# Patient Record
Sex: Male | Born: 1970 | Race: White | Hispanic: No | Marital: Married | Smoking: Former smoker
Health system: Southern US, Community
[De-identification: ages and names within clinical notes are randomized; demographics above are authoritative.]

## PROBLEM LIST (undated history)

## (undated) DIAGNOSIS — K760 Fatty (change of) liver, not elsewhere classified: Secondary | ICD-10-CM

## (undated) DIAGNOSIS — I25110 Atherosclerotic heart disease of native coronary artery with unstable angina pectoris: Secondary | ICD-10-CM

## (undated) DIAGNOSIS — I10 Essential (primary) hypertension: Secondary | ICD-10-CM

## (undated) DIAGNOSIS — E785 Hyperlipidemia, unspecified: Secondary | ICD-10-CM

## (undated) HISTORY — PX: CARDIAC CATHETERIZATION: SHX172

---

## 1998-09-26 ENCOUNTER — Emergency Department (HOSPITAL_COMMUNITY): Admission: EM | Admit: 1998-09-26 | Discharge: 1998-09-26 | Payer: Self-pay | Admitting: Emergency Medicine

## 1998-12-25 ENCOUNTER — Emergency Department (HOSPITAL_COMMUNITY): Admission: EM | Admit: 1998-12-25 | Discharge: 1998-12-25 | Payer: Self-pay | Admitting: Emergency Medicine

## 2002-12-27 ENCOUNTER — Emergency Department (HOSPITAL_COMMUNITY): Admission: EM | Admit: 2002-12-27 | Discharge: 2002-12-27 | Payer: Self-pay | Admitting: Emergency Medicine

## 2002-12-27 ENCOUNTER — Encounter: Payer: Self-pay | Admitting: Emergency Medicine

## 2011-08-31 ENCOUNTER — Emergency Department (HOSPITAL_COMMUNITY): Payer: Self-pay

## 2011-08-31 ENCOUNTER — Encounter (HOSPITAL_COMMUNITY): Payer: Self-pay | Admitting: Emergency Medicine

## 2011-08-31 ENCOUNTER — Emergency Department (HOSPITAL_COMMUNITY)
Admission: EM | Admit: 2011-08-31 | Discharge: 2011-08-31 | Disposition: A | Discharge status: Home / Self Care | Payer: Self-pay | Attending: Emergency Medicine | Admitting: Emergency Medicine

## 2011-08-31 DIAGNOSIS — R42 Dizziness and giddiness: Secondary | ICD-10-CM | POA: Insufficient documentation

## 2011-08-31 DIAGNOSIS — R059 Cough, unspecified: Secondary | ICD-10-CM | POA: Insufficient documentation

## 2011-08-31 DIAGNOSIS — R0602 Shortness of breath: Secondary | ICD-10-CM | POA: Insufficient documentation

## 2011-08-31 DIAGNOSIS — R1110 Vomiting, unspecified: Secondary | ICD-10-CM | POA: Insufficient documentation

## 2011-08-31 DIAGNOSIS — R05 Cough: Secondary | ICD-10-CM

## 2011-08-31 NOTE — ED Provider Notes (Signed)
History     CSN: [id number]  Arrival date & time 08/31/11  1248   First MD Initiated Contact with Patient 08/31/11 1520      Chief Complaint  Patient presents with  . URI  . Cough  . Dizziness  . Shortness of Breath    (Consider location/radiation/quality/duration/timing/severity/associated sxs/prior treatment) HPI Comments: Patient presents for MRSA part with cough. He CTs had a cough for about a month and a half. He was recently treated with doxycycline from his primary care provider Dr. Milana Kidney. He finished this about one week ago he says that the cough is actually getting better however certain things like weather changes to your bad coughing spells. He today had a bad coughing spell when he went on to the cold air this caused him to have some posttussive emesis as well as feeling dizzy and lightheaded. He denies any chest pain or shortness of breath now. He states he feels back to his baseline now. Denies any ongoing dizziness denies any history of chest pain  Patient is a 41 y.o. male presenting with URI, cough, and shortness of breath. The history is provided by the patient.  URI The primary symptoms include cough. Primary symptoms do not include fever, fatigue, headaches, abdominal pain, nausea, vomiting, arthralgias or rash.  The illness is not associated with chills, congestion or rhinorrhea.  Cough Associated symptoms include shortness of breath. Pertinent negatives include no chest pain, no chills, no headaches and no rhinorrhea.  Shortness of Breath  Associated symptoms include cough and shortness of breath. Pertinent negatives include no chest pain, no fever and no rhinorrhea.    History reviewed. No pertinent past medical history.  History reviewed. No pertinent past surgical history.  History reviewed. No pertinent family history.  History  Substance Use Topics  . Smoking status: Former Games developer  . Smokeless tobacco: Not on file  . Alcohol Use: No      Review  of Systems  Constitutional: Negative for fever, chills, diaphoresis and fatigue.  HENT: Negative for congestion, rhinorrhea and sneezing.   Eyes: Negative.   Respiratory: Positive for cough and shortness of breath. Negative for chest tightness.   Cardiovascular: Negative for chest pain and leg swelling.  Gastrointestinal: Negative for nausea, vomiting, abdominal pain, diarrhea and blood in stool.  Genitourinary: Negative for frequency, hematuria, flank pain and difficulty urinating.  Musculoskeletal: Negative for back pain and arthralgias.  Skin: Negative for rash.  Neurological: Positive for dizziness. Negative for speech difficulty, weakness, numbness and headaches.    Allergies  Shrimp  Home Medications   Current Outpatient Rx  Name Route Sig Dispense Refill  . ALBUTEROL SULFATE HFA 108 (90 BASE) MCG/ACT IN AERS Inhalation Inhale 2 puffs into the lungs every 6 (six) hours as needed. For shortness of breath    . AMPHETAMINE-DEXTROAMPHETAMINE 20 MG PO TABS Oral Take 10 mg by mouth daily.    Marland Kitchen DIAZEPAM 10 MG PO TABS Oral Take 10 mg by mouth at bedtime as needed. For sleep    . HYDROCODONE-ACETAMINOPHEN 10-325 MG PO TABS Oral Take 1 tablet by mouth every 6 (six) hours as needed. For pain    . OLMESARTAN-AMLODIPINE-HCTZ 40-10-25 MG PO TABS Oral Take 0.5 tablets by mouth daily.      BP 117/77  Pulse 116  Temp(Src) 98.1 F (36.7 C) (Oral)  Resp 24  SpO2 98%  Physical Exam  Constitutional: He is oriented to person, place, and time. He appears well-developed and well-nourished.  HENT:  Head: Normocephalic  and atraumatic.  Eyes: Pupils are equal, round, and reactive to light.  Neck: Normal range of motion. Neck supple.  Cardiovascular: Normal rate, regular rhythm and normal heart sounds.   Pulmonary/Chest: Effort normal and breath sounds normal. No respiratory distress. He has no wheezes. He has no rales. He exhibits no tenderness.  Abdominal: Soft. Bowel sounds are normal. There  is no tenderness. There is no rebound and no guarding.  Musculoskeletal: Normal range of motion. He exhibits no edema.  Lymphadenopathy:    He has no cervical adenopathy.  Neurological: He is alert and oriented to person, place, and time.  Skin: Skin is warm and dry. No rash noted.  Psychiatric: He has a normal mood and affect.    ED Course  Procedures (including critical care time)  Labs Reviewed - No data to display Dg Chest 2 View  08/31/2011  *RADIOLOGY REPORT*  Clinical Data: Shortness of breath.  CHEST - 2 VIEW  Comparison: None  Findings: The cardiac silhouette, mediastinal and hilar contours are within normal limits.  The lungs are clear.  No pleural effusion.  The bony thorax is intact.  IMPRESSION: No acute cardiopulmonary findings.  Original Report Authenticated By: P. Loralie Champagne, M.D.    Date: 08/31/2011  Rate: 119  Rhythm: sinus tachycardia  QRS Axis: normal  Intervals: normal  ST/T Wave abnormalities: normal  Conduction Disutrbances:none  Narrative Interpretation:   Old EKG Reviewed: none available  No results found for this or any previous visit. Dg Chest 2 View  08/31/2011  *RADIOLOGY REPORT*  Clinical Data: Shortness of breath.  CHEST - 2 VIEW  Comparison: None  Findings: The cardiac silhouette, mediastinal and hilar contours are within normal limits.  The lungs are clear.  No pleural effusion.  The bony thorax is intact.  IMPRESSION: No acute cardiopulmonary findings.  Original Report Authenticated By: P. Loralie Champagne, M.D.      1. Cough       MDM  Pt with no CP.  Symptoms during bad coughing spell.  No SOB now.  No evidence of pneumonia.  Nothing to suggest ACS/PE.  No pneumothorax.  Likely resolving bronchitis.  Will d/c to f/u with his PMD        Rolan Bucco, MD 08/31/11 [phone]

## 2011-08-31 NOTE — ED Notes (Signed)
Pt c/o intermittant cough, dizziness and SOB x 1.5 months that became more severe today

## 2011-08-31 NOTE — Discharge Instructions (Signed)
Cool Mist Vaporizers °Vaporizers may help relieve the symptoms of a cough and cold. By adding water to the air, mucus may become thinner and less sticky. This makes it easier to breathe and cough up secretions. Vaporizers have not been proven to show they help with colds. You should not use a vaporizer if you are allergic to mold. Cool mist vaporizers do not cause serious burns like hot mist vaporizers ("steamers"). °HOME CARE INSTRUCTIONS °· Follow the package instructions for your vaporizer.  °· Use a vaporizer that holds a large volume of water (1½ to 2 gallons [5.7 to 7.5 liters]).  °· Do not use anything other than distilled water in the vaporizer.  °· Do not run the vaporizer all of the time. This can cause mold or bacteria to grow in the vaporizer.  °· Clean the vaporizer after each time you use it.  °· Clean and dry the vaporizer well before you store it.  °· Stop using a vaporizer if you develop worsening respiratory symptoms.  °Document Released: 03/26/2004 Document Revised: 03/11/2011 Document Reviewed: 02/21/2009 °ExitCare® Patient Information ©2012 [employer]. °

## 2011-08-31 NOTE — ED Notes (Signed)
Patient c/o dizziness.  Patient states he has "drunk" feeling especially after coughing.  Patient states he has completed course of antibiotics for recent could/flu symptoms.  Patient states now he has dizzy feeling after coughing or when up and moving around.

## 2020-11-12 ENCOUNTER — Observation Stay (HOSPITAL_COMMUNITY): Payer: Self-pay

## 2020-11-12 ENCOUNTER — Emergency Department (HOSPITAL_COMMUNITY): Payer: Self-pay

## 2020-11-12 ENCOUNTER — Inpatient Hospital Stay (HOSPITAL_COMMUNITY)
Admission: EM | Disposition: A | Discharge status: Home / Self Care | Payer: Self-pay | Source: Home / Self Care | Attending: Thoracic Surgery (Cardiothoracic Vascular Surgery)

## 2020-11-12 ENCOUNTER — Encounter (HOSPITAL_COMMUNITY): Payer: Self-pay | Admitting: Cardiology

## 2020-11-12 ENCOUNTER — Other Ambulatory Visit: Payer: Self-pay

## 2020-11-12 ENCOUNTER — Inpatient Hospital Stay (HOSPITAL_COMMUNITY)
Admission: EM | Admit: 2020-11-12 | Discharge: 2020-11-22 | DRG: 234 | Disposition: A | Discharge status: Home / Self Care | Payer: Self-pay | Attending: Thoracic Surgery (Cardiothoracic Vascular Surgery) | Admitting: Thoracic Surgery (Cardiothoracic Vascular Surgery)

## 2020-11-12 DIAGNOSIS — D62 Acute posthemorrhagic anemia: Secondary | ICD-10-CM | POA: Diagnosis not present

## 2020-11-12 DIAGNOSIS — R400 Somnolence: Secondary | ICD-10-CM | POA: Diagnosis not present

## 2020-11-12 DIAGNOSIS — Z20822 Contact with and (suspected) exposure to covid-19: Secondary | ICD-10-CM | POA: Diagnosis present

## 2020-11-12 DIAGNOSIS — I480 Paroxysmal atrial fibrillation: Secondary | ICD-10-CM | POA: Diagnosis not present

## 2020-11-12 DIAGNOSIS — K760 Fatty (change of) liver, not elsewhere classified: Secondary | ICD-10-CM | POA: Diagnosis present

## 2020-11-12 DIAGNOSIS — I2584 Coronary atherosclerosis due to calcified coronary lesion: Secondary | ICD-10-CM | POA: Diagnosis present

## 2020-11-12 DIAGNOSIS — I9581 Postprocedural hypotension: Secondary | ICD-10-CM | POA: Diagnosis not present

## 2020-11-12 DIAGNOSIS — Z79899 Other long term (current) drug therapy: Secondary | ICD-10-CM

## 2020-11-12 DIAGNOSIS — Z8249 Family history of ischemic heart disease and other diseases of the circulatory system: Secondary | ICD-10-CM

## 2020-11-12 DIAGNOSIS — I25110 Atherosclerotic heart disease of native coronary artery with unstable angina pectoris: Principal | ICD-10-CM | POA: Diagnosis present

## 2020-11-12 DIAGNOSIS — R000 Tachycardia, unspecified: Secondary | ICD-10-CM | POA: Diagnosis not present

## 2020-11-12 DIAGNOSIS — F419 Anxiety disorder, unspecified: Secondary | ICD-10-CM | POA: Diagnosis present

## 2020-11-12 DIAGNOSIS — E669 Obesity, unspecified: Secondary | ICD-10-CM | POA: Diagnosis present

## 2020-11-12 DIAGNOSIS — I97190 Other postprocedural cardiac functional disturbances following cardiac surgery: Secondary | ICD-10-CM | POA: Diagnosis not present

## 2020-11-12 DIAGNOSIS — Z6830 Body mass index (BMI) 30.0-30.9, adult: Secondary | ICD-10-CM

## 2020-11-12 DIAGNOSIS — I249 Acute ischemic heart disease, unspecified: Secondary | ICD-10-CM | POA: Diagnosis present

## 2020-11-12 DIAGNOSIS — E8770 Fluid overload, unspecified: Secondary | ICD-10-CM | POA: Diagnosis not present

## 2020-11-12 DIAGNOSIS — I200 Unstable angina: Secondary | ICD-10-CM | POA: Diagnosis present

## 2020-11-12 DIAGNOSIS — Z87891 Personal history of nicotine dependence: Secondary | ICD-10-CM

## 2020-11-12 DIAGNOSIS — Z91013 Allergy to seafood: Secondary | ICD-10-CM

## 2020-11-12 DIAGNOSIS — I10 Essential (primary) hypertension: Secondary | ICD-10-CM | POA: Diagnosis present

## 2020-11-12 DIAGNOSIS — J449 Chronic obstructive pulmonary disease, unspecified: Secondary | ICD-10-CM | POA: Diagnosis present

## 2020-11-12 DIAGNOSIS — R079 Chest pain, unspecified: Secondary | ICD-10-CM

## 2020-11-12 DIAGNOSIS — J9811 Atelectasis: Secondary | ICD-10-CM | POA: Diagnosis not present

## 2020-11-12 DIAGNOSIS — E119 Type 2 diabetes mellitus without complications: Secondary | ICD-10-CM

## 2020-11-12 DIAGNOSIS — I4892 Unspecified atrial flutter: Secondary | ICD-10-CM | POA: Diagnosis not present

## 2020-11-12 DIAGNOSIS — Z951 Presence of aortocoronary bypass graft: Secondary | ICD-10-CM

## 2020-11-12 DIAGNOSIS — E785 Hyperlipidemia, unspecified: Secondary | ICD-10-CM | POA: Diagnosis present

## 2020-11-12 DIAGNOSIS — Y838 Other surgical procedures as the cause of abnormal reaction of the patient, or of later complication, without mention of misadventure at the time of the procedure: Secondary | ICD-10-CM | POA: Diagnosis not present

## 2020-11-12 DIAGNOSIS — I255 Ischemic cardiomyopathy: Secondary | ICD-10-CM | POA: Diagnosis present

## 2020-11-12 HISTORY — PX: LEFT HEART CATH AND CORONARY ANGIOGRAPHY: CATH118249

## 2020-11-12 HISTORY — DX: Atherosclerotic heart disease of native coronary artery with unstable angina pectoris: I25.110

## 2020-11-12 HISTORY — DX: Fatty (change of) liver, not elsewhere classified: K76.0

## 2020-11-12 LAB — COMPREHENSIVE METABOLIC PANEL
ALT: 61 U/L — ABNORMAL HIGH (ref 0–44)
AST: 31 U/L (ref 15–41)
Albumin: 3.6 g/dL (ref 3.5–5.0)
Alkaline Phosphatase: 57 U/L (ref 38–126)
Anion gap: 7 (ref 5–15)
BUN: 15 mg/dL (ref 6–20)
CO2: 26 mmol/L (ref 22–32)
Calcium: 9.2 mg/dL (ref 8.9–10.3)
Chloride: 102 mmol/L (ref 98–111)
Creatinine, Ser: 1.16 mg/dL (ref 0.61–1.24)
GFR, Estimated: >60 mL/min (ref >60)
Glucose, Bld: 172 mg/dL — ABNORMAL HIGH (ref 70–99)
Potassium: 3.7 mmol/L (ref 3.5–5.1)
Sodium: 135 mmol/L (ref 135–145)
Total Bilirubin: 1.1 mg/dL (ref 0.3–1.2)
Total Protein: 6.7 g/dL (ref 6.5–8.1)

## 2020-11-12 LAB — CBC WITH DIFFERENTIAL/PLATELET
Abs Immature Granulocytes: 0.03 10*3/uL (ref 0.00–0.07)
Basophils Absolute: 0 10*3/uL (ref 0.0–0.1)
Basophils Relative: 0 %
Eosinophils Absolute: 0.1 10*3/uL (ref 0.0–0.5)
Eosinophils Relative: 1 %
HCT: 41.6 % (ref 39.0–52.0)
Hemoglobin: 14.2 g/dL (ref 13.0–17.0)
Immature Granulocytes: 0 %
Lymphocytes Relative: 22 %
Lymphs Abs: 2.2 10*3/uL (ref 0.7–4.0)
MCH: 30.2 pg (ref 26.0–34.0)
MCHC: 34.1 g/dL (ref 30.0–36.0)
MCV: 88.5 fL (ref 80.0–100.0)
Monocytes Absolute: 1.1 10*3/uL — ABNORMAL HIGH (ref 0.1–1.0)
Monocytes Relative: 11 %
Neutro Abs: 6.3 10*3/uL (ref 1.7–7.7)
Neutrophils Relative %: 66 %
Platelets: 201 10*3/uL (ref 150–400)
RBC: 4.7 MIL/uL (ref 4.22–5.81)
RDW: 12.8 % (ref 11.5–15.5)
WBC: 9.6 10*3/uL (ref 4.0–10.5)
nRBC: 0 % (ref 0.0–0.2)

## 2020-11-12 LAB — HIV ANTIBODY (ROUTINE TESTING W REFLEX): HIV Screen 4th Generation wRfx: NONREACTIVE

## 2020-11-12 LAB — LIPID PANEL
Cholesterol: 181 mg/dL (ref 0–200)
HDL: 34 mg/dL — ABNORMAL LOW (ref >40)
LDL Cholesterol: 127 mg/dL — ABNORMAL HIGH (ref 0–99)
Total CHOL/HDL Ratio: 5.3 RATIO
Triglycerides: 100 mg/dL (ref <150)
VLDL: 20 mg/dL (ref 0–40)

## 2020-11-12 LAB — SARS CORONAVIRUS 2 BY RT PCR (HOSPITAL ORDER, PERFORMED IN ~~LOC~~ HOSPITAL LAB): SARS Coronavirus 2: NEGATIVE

## 2020-11-12 LAB — ECHOCARDIOGRAM COMPLETE
Area-P 1/2: 4.06 cm2
Height: 72 in
S' Lateral: 3.7 cm
Weight: 3600 oz

## 2020-11-12 LAB — TROPONIN I (HIGH SENSITIVITY)
Troponin I (High Sensitivity): 20 ng/L — ABNORMAL HIGH (ref <18)
Troponin I (High Sensitivity): 19 ng/L — ABNORMAL HIGH (ref <18)

## 2020-11-12 SURGERY — LEFT HEART CATH AND CORONARY ANGIOGRAPHY
Anesthesia: LOCAL

## 2020-11-12 MED ORDER — MIDAZOLAM HCL 2 MG/2ML IJ SOLN
INTRAMUSCULAR | Status: DC | PRN
  Administered 2020-11-12: 2 mg via INTRAVENOUS

## 2020-11-12 MED ORDER — SODIUM CHLORIDE 0.9 % WEIGHT BASED INFUSION
1.0000 mL/kg/h | INTRAVENOUS | Status: DC
  Administered 2020-11-12: 1 mL/kg/h via INTRAVENOUS

## 2020-11-12 MED ORDER — ONDANSETRON HCL 4 MG/2ML IJ SOLN: 4.0000 mg | Freq: Four times a day (QID) | INTRAMUSCULAR | Status: DC | PRN

## 2020-11-12 MED ORDER — HEPARIN SODIUM (PORCINE) 1000 UNIT/ML IJ SOLN
INTRAMUSCULAR | Status: AC
  Filled 2020-11-12: qty 1

## 2020-11-12 MED ORDER — NITROGLYCERIN IN D5W 200-5 MCG/ML-% IV SOLN
0.0000 ug/min | INTRAVENOUS | Status: DC
  Administered 2020-11-12: 5 ug/min via INTRAVENOUS
  Administered 2020-11-12: 7.5 ug/min via INTRAVENOUS
  Administered 2020-11-12: 12.5 ug/min via INTRAVENOUS
  Administered 2020-11-12: 15 ug/min via INTRAVENOUS
  Administered 2020-11-13: 50 ug/min via INTRAVENOUS
  Filled 2020-11-12 (×3): qty 250

## 2020-11-12 MED ORDER — NITROGLYCERIN 0.4 MG SL SUBL
0.4000 mg | SUBLINGUAL_TABLET | Freq: Once | SUBLINGUAL | Status: AC
  Administered 2020-11-12: 0.4 mg via SUBLINGUAL
  Filled 2020-11-12: qty 1

## 2020-11-12 MED ORDER — SODIUM CHLORIDE 0.9 % IV SOLN: 250.0000 mL | INTRAVENOUS | Status: DC | PRN

## 2020-11-12 MED ORDER — MORPHINE SULFATE (PF) 2 MG/ML IV SOLN: 2.0000 mg | Freq: Four times a day (QID) | INTRAVENOUS | Status: DC | PRN

## 2020-11-12 MED ORDER — ROSUVASTATIN CALCIUM 20 MG PO TABS
40.0000 mg | ORAL_TABLET | Freq: Every day | ORAL | Status: DC
  Administered 2020-11-12 – 2020-11-13 (×2): 40 mg via ORAL
  Filled 2020-11-12 (×2): qty 2

## 2020-11-12 MED ORDER — HEPARIN (PORCINE) IN NACL 1000-0.9 UT/500ML-% IV SOLN
INTRAVENOUS | Status: AC
  Filled 2020-11-12: qty 1500

## 2020-11-12 MED ORDER — VERAPAMIL HCL 2.5 MG/ML IV SOLN
INTRAVENOUS | Status: AC
  Filled 2020-11-12: qty 2

## 2020-11-12 MED ORDER — SODIUM CHLORIDE 0.9% FLUSH: 3.0000 mL | INTRAVENOUS | Status: DC | PRN

## 2020-11-12 MED ORDER — ASPIRIN 81 MG PO CHEW
324.0000 mg | CHEWABLE_TABLET | ORAL | Status: AC
Start: 2020-11-12 — End: 2020-11-12
  Administered 2020-11-12: 324 mg via ORAL
  Filled 2020-11-12: qty 4

## 2020-11-12 MED ORDER — ACETAMINOPHEN 325 MG PO TABS: 650.0000 mg | ORAL_TABLET | ORAL | Status: DC | PRN

## 2020-11-12 MED ORDER — SODIUM CHLORIDE 0.9 % WEIGHT BASED INFUSION: 1.0000 mL/kg/h | INTRAVENOUS | Status: DC

## 2020-11-12 MED ORDER — SODIUM CHLORIDE 0.9 % WEIGHT BASED INFUSION: 3.0000 mL/kg/h | INTRAVENOUS | Status: DC

## 2020-11-12 MED ORDER — VERAPAMIL HCL 2.5 MG/ML IV SOLN
INTRAVENOUS | Status: DC | PRN
  Administered 2020-11-12: 5 mL via INTRA_ARTERIAL

## 2020-11-12 MED ORDER — HEPARIN (PORCINE) 25000 UT/250ML-% IV SOLN
1400.0000 [IU]/h | INTRAVENOUS | Status: DC
  Administered 2020-11-12: 1400 [IU]/h via INTRAVENOUS
  Filled 2020-11-12: qty 250

## 2020-11-12 MED ORDER — LIDOCAINE HCL (PF) 1 % IJ SOLN
INTRAMUSCULAR | Status: DC | PRN
  Administered 2020-11-12: 2 mL

## 2020-11-12 MED ORDER — IOHEXOL 350 MG/ML SOLN
INTRAVENOUS | Status: DC | PRN
  Administered 2020-11-12: 65 mL

## 2020-11-12 MED ORDER — MORPHINE SULFATE (PF) 2 MG/ML IV SOLN
2.0000 mg | Freq: Four times a day (QID) | INTRAVENOUS | Status: DC | PRN
  Administered 2020-11-12 (×2): 2 mg via INTRAVENOUS
  Filled 2020-11-12 (×2): qty 1

## 2020-11-12 MED ORDER — FENTANYL CITRATE (PF) 100 MCG/2ML IJ SOLN
50.0000 ug | Freq: Once | INTRAMUSCULAR | Status: AC
  Administered 2020-11-12: 50 ug via INTRAVENOUS
  Filled 2020-11-12: qty 2

## 2020-11-12 MED ORDER — HEPARIN (PORCINE) IN NACL 1000-0.9 UT/500ML-% IV SOLN
INTRAVENOUS | Status: DC | PRN
  Administered 2020-11-12 (×2): 500 mL

## 2020-11-12 MED ORDER — ASPIRIN 81 MG PO CHEW: 81.0000 mg | CHEWABLE_TABLET | ORAL | Status: DC

## 2020-11-12 MED ORDER — FENTANYL CITRATE (PF) 100 MCG/2ML IJ SOLN
INTRAMUSCULAR | Status: AC
  Filled 2020-11-12: qty 2

## 2020-11-12 MED ORDER — IOHEXOL 350 MG/ML SOLN
64.0000 mL | Freq: Once | INTRAVENOUS | Status: AC | PRN
  Administered 2020-11-12: 64 mL via INTRAVENOUS

## 2020-11-12 MED ORDER — PERFLUTREN LIPID MICROSPHERE
1.0000 mL | INTRAVENOUS | Status: AC | PRN
  Administered 2020-11-12: 2 mL via INTRAVENOUS
  Filled 2020-11-12: qty 10

## 2020-11-12 MED ORDER — HEPARIN BOLUS VIA INFUSION
4000.0000 [IU] | Freq: Once | INTRAVENOUS | Status: AC
  Administered 2020-11-12: 4000 [IU] via INTRAVENOUS
  Filled 2020-11-12: qty 4000

## 2020-11-12 MED ORDER — FUROSEMIDE 10 MG/ML IJ SOLN
20.0000 mg | Freq: Once | INTRAMUSCULAR | Status: AC
  Administered 2020-11-12: 20 mg via INTRAVENOUS
  Filled 2020-11-12: qty 2

## 2020-11-12 MED ORDER — METOPROLOL TARTRATE 25 MG PO TABS
25.0000 mg | ORAL_TABLET | Freq: Two times a day (BID) | ORAL | Status: DC
  Administered 2020-11-12: 25 mg via ORAL
  Filled 2020-11-12: qty 1

## 2020-11-12 MED ORDER — MIDAZOLAM HCL 2 MG/2ML IJ SOLN
INTRAMUSCULAR | Status: AC
  Filled 2020-11-12: qty 2

## 2020-11-12 MED ORDER — METOPROLOL TARTRATE 12.5 MG HALF TABLET
12.5000 mg | ORAL_TABLET | Freq: Two times a day (BID) | ORAL | Status: DC
  Administered 2020-11-12: 12.5 mg via ORAL
  Filled 2020-11-12: qty 1

## 2020-11-12 MED ORDER — ASPIRIN 300 MG RE SUPP: 300.0000 mg | RECTAL | Status: AC

## 2020-11-12 MED ORDER — ONDANSETRON HCL 4 MG/2ML IJ SOLN
4.0000 mg | Freq: Once | INTRAMUSCULAR | Status: AC
  Administered 2020-11-12: 4 mg via INTRAVENOUS
  Filled 2020-11-12: qty 2

## 2020-11-12 MED ORDER — FENTANYL CITRATE (PF) 100 MCG/2ML IJ SOLN
INTRAMUSCULAR | Status: DC | PRN
  Administered 2020-11-12: 25 ug via INTRAVENOUS

## 2020-11-12 MED ORDER — SODIUM CHLORIDE 0.9 % WEIGHT BASED INFUSION
1.0000 mL/kg/h | INTRAVENOUS | Status: DC
  Administered 2020-11-12 (×2): 1 mL/kg/h via INTRAVENOUS

## 2020-11-12 MED ORDER — ONDANSETRON HCL 4 MG/2ML IJ SOLN
4.0000 mg | Freq: Four times a day (QID) | INTRAMUSCULAR | Status: DC | PRN
  Administered 2020-11-13: 4 mg via INTRAVENOUS
  Filled 2020-11-12: qty 2

## 2020-11-12 MED ORDER — SODIUM CHLORIDE 0.9% FLUSH
3.0000 mL | Freq: Two times a day (BID) | INTRAVENOUS | Status: DC
  Administered 2020-11-12: 3 mL via INTRAVENOUS

## 2020-11-12 MED ORDER — HEPARIN (PORCINE) 25000 UT/250ML-% IV SOLN
1600.0000 [IU]/h | INTRAVENOUS | Status: AC
  Administered 2020-11-12: 1400 [IU]/h via INTRAVENOUS
  Administered 2020-11-13: 1600 [IU]/h via INTRAVENOUS
  Filled 2020-11-12: qty 250

## 2020-11-12 MED ORDER — SODIUM CHLORIDE 0.9 % IV BOLUS
1000.0000 mL | Freq: Once | INTRAVENOUS | Status: AC
  Administered 2020-11-12: 1000 mL via INTRAVENOUS

## 2020-11-12 MED ORDER — LIDOCAINE HCL (PF) 1 % IJ SOLN
INTRAMUSCULAR | Status: AC
  Filled 2020-11-12: qty 30

## 2020-11-12 MED ORDER — NITROGLYCERIN 0.4 MG SL SUBL: 0.4000 mg | SUBLINGUAL_TABLET | SUBLINGUAL | Status: DC | PRN

## 2020-11-12 MED ORDER — ASPIRIN EC 81 MG PO TBEC
81.0000 mg | DELAYED_RELEASE_TABLET | Freq: Every day | ORAL | Status: DC
  Administered 2020-11-13: 81 mg via ORAL
  Filled 2020-11-12: qty 1

## 2020-11-12 MED ORDER — HEPARIN SODIUM (PORCINE) 1000 UNIT/ML IJ SOLN
INTRAMUSCULAR | Status: DC | PRN
  Administered 2020-11-12: 5000 [IU] via INTRAVENOUS

## 2020-11-12 MED ORDER — SODIUM CHLORIDE 0.9% FLUSH
3.0000 mL | Freq: Two times a day (BID) | INTRAVENOUS | Status: DC
  Administered 2020-11-13: 3 mL via INTRAVENOUS

## 2020-11-12 SURGICAL SUPPLY — 10 items
CATH INFINITI JR4 5F (CATHETERS) ×2 IMPLANT
CATH OPTITORQUE TIG 4.0 5F (CATHETERS) ×2 IMPLANT
DEVICE RAD COMP TR BAND LRG (VASCULAR PRODUCTS) ×2 IMPLANT
GLIDESHEATH SLEND A-KIT 6F 22G (SHEATH) ×2 IMPLANT
GUIDEWIRE INQWIRE 1.5J.035X260 (WIRE) ×1 IMPLANT
INQWIRE 1.5J .035X260CM (WIRE) ×2
KIT HEART LEFT (KITS) ×2 IMPLANT
PACK CARDIAC CATHETERIZATION (CUSTOM PROCEDURE TRAY) ×2 IMPLANT
TRANSDUCER W/STOPCOCK (MISCELLANEOUS) ×2 IMPLANT
TUBING CIL FLEX 10 FLL-RA (TUBING) ×2 IMPLANT

## 2020-11-12 NOTE — Interval H&P Note (Signed)
History and Physical Interval Note:  11/12/2020 1:46 PM  Gerald Andrade  has presented today for surgery, with the diagnosis of unstable angina.  The various methods of treatment have been discussed with the patient and family. After consideration of risks, benefits and other options for treatment, the patient has consented to  Procedure(s): LEFT HEART CATH AND CORONARY ANGIOGRAPHY (N/A) and possible angioplasty as a surgical intervention.  The patient's history has been reviewed, patient examined, no change in status, stable for surgery.  I have reviewed the patient's chart and labs.  Questions were answered to the patient's satisfaction.    Cath Lab Visit (complete for each Cath Lab visit)  Clinical Evaluation Leading to the Procedure:   ACS: Yes.    Non-ACS:    Anginal Classification: CCS IV  Anti-ischemic medical therapy: Minimal Therapy (1 class of medications)  Non-Invasive Test Results: No non-invasive testing performed  Prior CABG: No previous CABG  Yates Decamp

## 2020-11-12 NOTE — Progress Notes (Signed)
  Echocardiogram 2D Echocardiogram has been performed.  Gerald Andrade 11/12/2020, 10:08 AM

## 2020-11-12 NOTE — ED Triage Notes (Signed)
BIB [facility] from home. Around 0130 started having across the chest pain. Its worse when pt inhales and ambulates. No radiation noted. 325 aspirin and 2 nitro PTA. Hx: COPD  133/76  88 heart rate 98%

## 2020-11-12 NOTE — ED Provider Notes (Addendum)
[facility] EMERGENCY DEPARTMENT Provider Note   CSN: [id number] Arrival date & time: 11/12/20  0341     History Chief Complaint  Patient presents with  . Chest Pain    Gerald Andrade is a 50 y.o. male.  HPI   50 year old male with history of hypertension, COPD, who presents to the emergency department today for evaluation of chest pain.  Patient was working in the yard yesterday and was in his normal state of health.  This morning around 1 AM he was woken with chest pain to the mid chest that radiates up the jaw.  Denies any radiation of pain to the back or arm.  He reports some pain with inspiration but denies any shortness of breath, cough, hemoptysis, bilateral lower extremity swelling, calf pain. Denies recent surgeries, hospital admissions, h/o vte/ca. tooks 325 asa, tums and his inhaler pta. Also had 2 ntg with ems.  Reports his father had CABG in his 43s. Mother had MI in mid 31s.   No past medical history on file.  Patient Active Problem List   Diagnosis Date Noted  . ACS (acute coronary syndrome) (HCC) 11/12/2020    No past surgical history on file.     No family history on file.  Social History   Tobacco Use  . Smoking status: Former Smoker  Substance Use Topics  . Alcohol use: No  . Drug use: No    Home Medications Prior to Admission medications   Medication Sig Start Date End Date Taking? Authorizing Provider  albuterol (PROVENTIL HFA;VENTOLIN HFA) 108 (90 BASE) MCG/ACT inhaler Inhale 2 puffs into the lungs every 6 (six) hours as needed for wheezing or shortness of breath.   Yes [provider]  amphetamine-dextroamphetamine (ADDERALL) 20 MG tablet Take 20 mg by mouth daily.   Yes [provider]  Cyanocobalamin (VITAMIN B 12 PO) Take 1 tablet by mouth daily.   Yes [provider]  diazepam (VALIUM) 10 MG tablet Take 10 mg by mouth at bedtime as needed for anxiety or sleep.   Yes [provider]   diphenhydrAMINE (BENADRYL) 25 MG tablet Take 25 mg by mouth every 6 (six) hours as needed for allergies.   Yes [provider]  montelukast (SINGULAIR) 10 MG tablet Take 1 tablet by mouth at bedtime. 10/03/20  Yes [provider]  Olmesartan-amLODIPine-HCTZ 40-10-25 MG TABS Take 0.5 tablets by mouth daily.   Yes [provider]  OVER THE COUNTER MEDICATION Take 2 tablets by mouth daily. T Male   Yes [provider]  Vitamin D, Cholecalciferol, 25 MCG (1000 UT) TABS Take 1,000 Units by mouth daily.   Yes [provider]  HYDROcodone-acetaminophen (NORCO) 10-325 MG per tablet Take 1 tablet by mouth every 6 (six) hours as needed for moderate pain.    [provider]    Allergies    Shrimp [shellfish allergy]  Review of Systems   Review of Systems  Constitutional: Negative for fever.  HENT: Negative for ear pain and sore throat.   Eyes: Negative for visual disturbance.  Respiratory: Negative for cough and shortness of breath.   Cardiovascular: Positive for chest pain. Negative for leg swelling.  Gastrointestinal: Negative for abdominal pain, constipation, diarrhea, nausea and vomiting.  Genitourinary: Negative for dysuria and hematuria.  Musculoskeletal: Negative for back pain.  Skin: Negative for rash.  Neurological: Negative for headaches.  All other systems reviewed and are negative.   Physical Exam Updated Vital Signs BP 107/71  Pulse 95   Temp (!) 97.5 F (36.4 C) (Oral)   Resp 18   Ht 6' (1.829 m)   Wt 102.1 kg   SpO2 98%   BMI 30.52 kg/m   Physical Exam Vitals and nursing note reviewed.  Constitutional:      Appearance: He is well-developed.  HENT:     Head: Normocephalic and atraumatic.  Eyes:     Conjunctiva/sclera: Conjunctivae normal.  Cardiovascular:     Rate and Rhythm: Normal rate and regular rhythm.     Heart sounds: Normal heart sounds. No murmur heard.   Pulmonary:     Effort: Pulmonary effort is  normal. No respiratory distress.     Breath sounds: Examination of the left-lower field reveals rales. Rales present.  Abdominal:     Palpations: Abdomen is soft.     Tenderness: There is no abdominal tenderness. There is no guarding or rebound.  Musculoskeletal:     Cervical back: Neck supple.  Skin:    General: Skin is warm and dry.  Neurological:     Mental Status: He is alert.     ED Results / Procedures / Treatments   Labs (all labs ordered are listed, but only abnormal results are displayed) Labs Reviewed  CBC WITH DIFFERENTIAL/PLATELET - Abnormal; Notable for the following components:      Result Value   Monocytes Absolute 1.1 (*)    All other components within normal limits  COMPREHENSIVE METABOLIC PANEL - Abnormal; Notable for the following components:   Glucose, Bld 172 (*)    ALT 61 (*)    All other components within normal limits  TROPONIN I (HIGH SENSITIVITY) - Abnormal; Notable for the following components:   Troponin I (High Sensitivity) 20 (*)    All other components within normal limits  TROPONIN I (HIGH SENSITIVITY) - Abnormal; Notable for the following components:   Troponin I (High Sensitivity) 19 (*)    All other components within normal limits  HEPARIN LEVEL (UNFRACTIONATED)  HIV ANTIBODY (ROUTINE TESTING W REFLEX)    EKG EKG Interpretation  Date/Time:  Tuesday Nov 12 2020 06:52:44 EDT Ventricular Rate:  97 PR Interval:  125 QRS Duration: 102 QT Interval:  347 QTC Calculation: 441 R Axis:   46 Text Interpretation: Sinus rhythm Abnormal R-wave progression, late transition Minimal ST elevation, lateral leads Confirmed by Nicanor Alcon, April ([id number]) on 11/12/2020 7:12:24 AM   Radiology DG Chest 1 View  Result Date: 11/12/2020 CLINICAL DATA:  Chest pain EXAM: CHEST  1 VIEW COMPARISON:  08/31/2011 FINDINGS: Generous heart size accentuated by low volumes. There is no edema, consolidation, effusion, or pneumothorax. Artifact from EKG leads. IMPRESSION: No  evidence of active disease. Electronically Signed   By: Marnee Spring M.D.   On: 11/12/2020 05:03   CT Angio Chest PE W and/or Wo Contrast  Result Date: 11/12/2020 CLINICAL DATA:  Chest pain that is worse with inhalation and ambulation. EXAM: CT ANGIOGRAPHY CHEST WITH CONTRAST TECHNIQUE: Multidetector CT imaging of the chest was performed using the standard protocol during bolus administration of intravenous contrast. Multiplanar CT image reconstructions and MIPs were obtained to evaluate the vascular anatomy. CONTRAST:  28mL OMNIPAQUE IOHEXOL 350 MG/ML SOLN COMPARISON:  None. FINDINGS: Cardiovascular: Satisfactory opacification of the pulmonary arteries to the segmental level. No evidence of pulmonary embolism. Normal heart size. No pericardial effusion. Coronary atheromatous calcification, especially notable for age. Mediastinum/Nodes: Negative for adenopathy or mass. Lungs/Pleura: There is no edema, consolidation, effusion, or pneumothorax. Dependent atelectasis. Triangular subpleural nodule  at the right minor fissure measuring 4 mm in average diameter, morphology consistent with lymph node. Upper Abdomen: Negative Musculoskeletal: Negative Review of the MIP images confirms the above findings. IMPRESSION: 1. Negative for pulmonary embolism or other acute finding. 2. Multifocal coronary calcification, notable for age. Electronically Signed   By: Marnee Spring M.D.   On: 11/12/2020 05:42    Procedures Procedures   CRITICAL CARE Performed by: Karrie Meres   Total critical care time: 43 minutes  Critical care time was exclusive of separately billable procedures and treating other patients.  Critical care was necessary to treat or prevent imminent or life-threatening deterioration.  Critical care was time spent personally by me on the following activities: development of treatment plan with patient and/or surrogate as well as nursing, discussions with consultants, evaluation of patient's  response to treatment, examination of patient, obtaining history from patient or surrogate, ordering and performing treatments and interventions, ordering and review of laboratory studies, ordering and review of radiographic studies, pulse oximetry and re-evaluation of patient's condition.   Medications Ordered in ED Medications  nitroGLYCERIN 50 mg in dextrose 5 % 250 mL (0.2 mg/mL) infusion (5 mcg/min Intravenous New Bag/Given 11/12/20 0720)  ondansetron (ZOFRAN) injection 4 mg (has no administration in time range)  heparin bolus via infusion 4,000 Units (has no administration in time range)  heparin ADULT infusion 100 units/mL (25000 units/225mL) (has no administration in time range)  aspirin chewable tablet 324 mg (has no administration in time range)    Or  aspirin suppository 300 mg (has no administration in time range)  aspirin EC tablet 81 mg (has no administration in time range)  nitroGLYCERIN (NITROSTAT) SL tablet 0.4 mg (has no administration in time range)  acetaminophen (TYLENOL) tablet 650 mg (has no administration in time range)  ondansetron (ZOFRAN) injection 4 mg (has no administration in time range)  fentaNYL (SUBLIMAZE) injection 50 mcg (50 mcg Intravenous Given 11/12/20 0440)  iohexol (OMNIPAQUE) 350 MG/ML injection 64 mL (64 mLs Intravenous Contrast Given 11/12/20 0523)  nitroGLYCERIN (NITROSTAT) SL tablet 0.4 mg (0.4 mg Sublingual Given 11/12/20 0630)  sodium chloride 0.9 % bolus 1,000 mL (1,000 mLs Intravenous New Bag/Given 11/12/20 5284)    ED Course  I have reviewed the triage vital signs and the nursing notes.  Pertinent labs & imaging results that were available during my care of the patient were reviewed by me and considered in my medical decision making (see chart for details).    MDM Rules/Calculators/A&P                          50 year old male presenting emergency department today for evaluation of chest pain that woke him up from sleep this  morning.  Reviewed/interpreted labs CBC is unremarkable CMP unremarkable trops are marginally elevated but flat  Multiple ekgs performed - Sinus rhythm Abnormal R-wave progression, late transition Minimal ST elevation, lateral leads - pt does not meet stemi criteria  CXR - No evidence of active disease CTA chest - 1. Negative for pulmonary embolism or other acute finding. 2. Multifocal coronary calcification, notable for age.  6:38 AM CONSULT with Dr. Allena Katz, he evaluated ekg. Recommended heparin and ntg drip.   7:18 AM Dr. Allena Katz evaluated the pt at bedside and admits pt  Final Clinical Impression(s) / ED Diagnoses Final diagnoses:  Unstable angina (HCC)    Rx / DC Orders ED Discharge Orders    None  Karrie MeresCouture, Aadvika Konen S, PA-C 11/12/20 [id number]    Karrie MeresCouture, Kolette Vey S, PA-C 11/12/20 [id number]    Palumbo, April, MD 11/18/20 2327

## 2020-11-12 NOTE — CV Procedure (Signed)
  Left Heart Catheterization 11/12/20:  LV: Mild LV systolic dysfunction, EF 45% with inferior akinesis.  No significant mitral regurgitation.  Normal EDP.  No pressure gradient across the aortic valve. LM: Large-caliber vessel, normal.  Short. LAD: Large vessel in the proximal segment.  Proximal LAD has an ulcerated lesion.  There is origin of a very large D1 at the proximal segment and a large septal perforator at which point the LAD is occluded and has faint ipsilateral collaterals from the circumflex, diagonal and the SP1, and after reconstitution of the LAD, there is a focal 80% stenosis.  The diagonal 1 is very large and has proximal high-grade 80 to 90% stenosis. CX: Large vessel, giving origin to small OM1 after which it is a CTO with bridging collaterals.  Distal circumflex also receives collaterals from the LAD.  Circumflex also gives collaterals to occluded RCA. RCA: It is occluded in the midsegment after the origin of RV branch.  Distal RCA including PL and PDA branches are large and are collateralized from circumflex and LAD.  Recommendation: Patient has complex multivessel disease and would benefit from CABG with arterial conduits if possible including LIMA to LAD and diagonal and RIMA to RCA and probably a free radial graft to the circumflex.  Surgical consultation has been requested.  Patient needs to be admitted inpatient in view of complex coronary anatomy and high risk for cardiac events.     Yates Decamp, MD, [facility] 11/12/2020, 2:53 PM Office: [phone] Pager: [phone]

## 2020-11-12 NOTE — Progress Notes (Signed)
[address]       [place] [address]             [phone]        Koben Daman [facility] Medical Record #[id number] Date of Birth: 11-25-1970  Referring: Dr. Jacinto Halim, MD Primary Care: Kaleen Mask, MD Primary Cardiologist:None  Chief Complaint:    Chief Complaint  Patient presents with  . Chest Pain  Reason for consultation: Multivessel CAD  History of Present Illness:     This is a 50 year old Caucasian male with a past medical history of hypertension, probable COPD, former tobacco abuse, fatty liver, and strong family history who expereienced chest pain (middle of chest and did not radiate) at 1:30 am this morning. According to patient, he had mowed 4 yards Monday and tried to rest after that.  After the initial chest pain began, he took 2 puffs of Albuterol as he thought the cause might be from pollen, which did not really help. He then took ec asa 325 mg, which again, did not alleviate the chest pain. Finally, he took two 5 mg Valium and asked his wife to call 911 as chest pain continued.  EKG showed subtle ST elevation in lead V2 with minimal ST depression in lead II and III. His chest pain did improve with Nitroglycerin. Troponin I (high sensitivity) highest has been 20. CT scan of the chest was negative for PE but did show multifocal coronary calcification. Echo showed LVEF 55-60% and no significant valvular disease. He then underwent a cardiac catheterization which showed LVEF 45%, proximal LAD to Mid LAD lesion is 100% stenosed, 1st Diag lesion is 90% stenosed, proximal Circumflex to mid Circumflx lesion is 100% stenosed, and proximal RCA to Mid RCA lesion is 100% stenosed. A cardiothoracic consultation has been requested with Dr. Cornelius Moras. At the time of my exam, patient was having some chest pain, on a Nitro drip. Heparin drip will be restarted in several hours. Current Activity/ Functional Status: Patient is independent with  mobility/ambulation, transfers, ADL's, IADL's.   Zubrod Score: At the time of surgery this patient's most appropriate activity status/level should be described as: []     0    Normal activity, no symptoms [x]     1    Restricted in physical strenuous activity but ambulatory, able to do out light work []     2    Ambulatory and capable of self care, unable to do work activities, up and about more than 50%  Of the time                            []     3    Only limited self care, in bed greater than 50% of waking hours []     4    Completely disabled, no self care, confined to bed or chair []     5    Moribund  Past Medical History:  Diagnosis Date  . Fatty liver    Surgical History: History reviewed. No pertinent surgical history.  Social History   Tobacco Use  Smoking Status Former Smoker  . Types: Cigarettes  Smokeless Tobacco Not on file    Social History   Substance and Sexual Activity  Alcohol Use No  Patient is married, has 9 children and 6 grand children. He is employed as a .   Allergies  Allergen Reactions  . Shrimp [Shellfish Allergy] Swelling  Chest tighten     Current Facility-Administered Medications  Medication Dose Route Frequency Provider Last Rate Last Admin  . 0.9 %  sodium chloride infusion  250 mL Intravenous PRN Patwardhan, Manish J, MD      . Melene Muller ON 11/13/2020] 0.9 %  sodium chloride infusion  250 mL Intravenous PRN Yates Decamp, MD      . 0.9% sodium chloride infusion  1 mL/kg/hr Intravenous Continuous Yates Decamp, MD 102.1 mL/hr at 11/12/20 1507 1 mL/kg/hr at 11/12/20 1507  . acetaminophen (TYLENOL) tablet 650 mg  650 mg Oral Q4H PRN Yates Decamp, MD      . Melene Muller ON 11/13/2020] aspirin EC tablet 81 mg  81 mg Oral Daily Yates Decamp, MD      . heparin ADULT infusion 100 units/mL (25000 units/28mL)  1,400 Units/hr Intravenous Continuous Patwardhan, Manish J, MD      . metoprolol tartrate (LOPRESSOR) tablet 25 mg  25 mg Oral BID Yates Decamp, MD      .  nitroGLYCERIN (NITROSTAT) SL tablet 0.4 mg  0.4 mg Sublingual Q5 Min x 3 PRN Yates Decamp, MD      . nitroGLYCERIN 50 mg in dextrose 5 % 250 mL (0.2 mg/mL) infusion  0-200 mcg/min Intravenous Continuous Yates Decamp, MD 6 mL/hr at 11/12/20 1613 20 mcg/min at 11/12/20 1613  . ondansetron (ZOFRAN) injection 4 mg  4 mg Intravenous Q6H PRN Yates Decamp, MD      . rosuvastatin (CRESTOR) tablet 40 mg  40 mg Oral Daily Yates Decamp, MD   40 mg at 11/12/20 1027  . sodium chloride flush (NS) 0.9 % injection 3 mL  3 mL Intravenous Q12H Patwardhan, Manish J, MD      . sodium chloride flush (NS) 0.9 % injection 3 mL  3 mL Intravenous PRN Patwardhan, Manish J, MD      . Melene Muller ON 11/13/2020] sodium chloride flush (NS) 0.9 % injection 3 mL  3 mL Intravenous Q12H Yates Decamp, MD      . Melene Muller ON 11/13/2020] sodium chloride flush (NS) 0.9 % injection 3 mL  3 mL Intravenous PRN Yates Decamp, MD        Medications Prior to Admission  Medication Sig Dispense Refill Last Dose  . albuterol (PROVENTIL HFA;VENTOLIN HFA) 108 (90 BASE) MCG/ACT inhaler Inhale 2 puffs into the lungs every 6 (six) hours as needed for wheezing or shortness of breath.   11/12/2020 at Unknown time  . amphetamine-dextroamphetamine (ADDERALL) 20 MG tablet Take 20 mg by mouth daily.   11/11/2020 at Unknown time  . Cyanocobalamin (VITAMIN B 12 PO) Take 1 tablet by mouth daily.   Past Week at Unknown time  . diazepam (VALIUM) 10 MG tablet Take 10 mg by mouth at bedtime as needed for anxiety or sleep.   11/12/2020 at Unknown time  . diphenhydrAMINE (BENADRYL) 25 MG tablet Take 25 mg by mouth every 6 (six) hours as needed for allergies.   11/11/2020 at Unknown time  . montelukast (SINGULAIR) 10 MG tablet Take 1 tablet by mouth at bedtime.   11/11/2020 at Unknown time  . Olmesartan-amLODIPine-HCTZ 40-10-25 MG TABS Take 0.5 tablets by mouth daily.   11/11/2020 at Unknown time  . OVER THE COUNTER MEDICATION Take 2 tablets by mouth daily. T Male   Past Week at Unknown time  .  Vitamin D, Cholecalciferol, 25 MCG (1000 UT) TABS Take 1,000 Units by mouth daily.   Past Week at Unknown time  . HYDROcodone-acetaminophen (NORCO) 10-325 MG per  tablet Take 1 tablet by mouth every 6 (six) hours as needed for moderate pain.   unk    Family History  Problem Relation Age of Onset  . CAD Mother-had MI, PCI   . CAD Father-had open heart surgery   Both mother and father are alive. Mother is 3 and father is 80 (in Hospice but lives with patient)  Review of Systems:     Cardiac Review of Systems: Y or  [ N   ]= no  Chest Pain [  Y  ]  Resting SOB [  N ] Exertional SOB  [ N ]  Orthopnea [ N ]   Pedal Edema [ N  ]    Syncope  Klaus.Mock  ]   Presyncope [  N ]  General Review of Systems: [Y] = yes [ N ]=no Constitional:  fatigue [ Y ]; nausea Klaus.Mock  ]; night sweats N[  ]; fever [ N ]; or chills [ N ]                                                              Patient has had COVID-mild symptoms and is NOT vaccinated  Eye :  diplopia [ N  ];   Amaurosis fugax[ N ]; Resp: cough [ N ];  wheezing[N  ];  hemoptysis[N  ];  GI: vomiting[ N ];  dysphagia[ N ]; melena[N  ];  hematochezia Klaus.Mock  ];  GU: hematuria[ N ];                 Skin: peripheral edema[ N ];   Musculosketetal:  joint swelling[ N ];  joint erythema[  N];   Heme/Lymph: anemia[N  ];  Neuro: Deyanira.Kussmaul  ];   stroke[N  ];  vertigo[N  ];  seizures[ N ];   difficulty walking[N  ];   Endocrine: diabetes[ N ];  thyroid dysfunction[N  ];             Physical Exam: BP 114/77   Pulse 86   Temp 99 F (37.2 C) (Oral)   Resp 20   Ht 6' (1.829 m)   Wt 102.1 kg   SpO2 97%   BMI 30.52 kg/m    General appearance: alert, cooperative and no distress Head: Normocephalic, without obvious abnormality, atraumatic Neck: no carotid bruit, no JVD and supple, symmetrical, trachea midline Resp: clear to auscultation bilaterally Cardio: RRR, no murmur GI: Soft, non tender, sporadic bowel sounds Extremities: No LE edema. Palpable DP/PT  bilaterally Neurologic: Grossly normal  Diagnostic Studies & Laboratory data:     Recent Radiology Findings:   DG Chest 1 View  Result Date: 11/12/2020 CLINICAL DATA:  Chest pain EXAM: CHEST  1 VIEW COMPARISON:  08/31/2011 FINDINGS: Generous heart size accentuated by low volumes. There is no edema, consolidation, effusion, or pneumothorax. Artifact from EKG leads. IMPRESSION: No evidence of active disease. Electronically Signed   By: Marnee Spring M.D.   On: 11/12/2020 05:03   CT Angio Chest PE W and/or Wo Contrast  Result Date: 11/12/2020 CLINICAL DATA:  Chest pain that is worse with inhalation and ambulation. EXAM: CT ANGIOGRAPHY CHEST WITH CONTRAST TECHNIQUE: Multidetector CT imaging of the chest was performed using the standard protocol during bolus administration of intravenous contrast. Multiplanar CT image  reconstructions and MIPs were obtained to evaluate the vascular anatomy. CONTRAST:  64mL OMNIPAQUE IOHEXOL 350 MG/ML SOLN COMPARISON:  None. FINDINGS: Cardiovascular: Satisfactory opacification of the pulmonary arteries to the segmental level. No evidence of pulmonary embolism. Normal heart size. No pericardial effusion. Coronary atheromatous calcification, especially notable for age. Mediastinum/Nodes: Negative for adenopathy or mass. Lungs/Pleura: There is no edema, consolidation, effusion, or pneumothorax. Dependent atelectasis. Triangular subpleural nodule at the right minor fissure measuring 4 mm in average diameter, morphology consistent with lymph node. Upper Abdomen: Negative Musculoskeletal: Negative Review of the MIP images confirms the above findings. IMPRESSION: 1. Negative for pulmonary embolism or other acute finding. 2. Multifocal coronary calcification, notable for age. Electronically Signed   By: Marnee SpringJonathon  Watts M.D.   On: 11/12/2020 05:42   CARDIAC CATHETERIZATION  Result Date: 11/12/2020 Left Heart Catheterization 11/12/20: LV: Mild LV systolic dysfunction, EF 45% with  inferior akinesis.  No significant mitral regurgitation.  Normal EDP.  No pressure gradient across the aortic valve. LM: Large-caliber vessel, normal.  Short. LAD: Large vessel in the proximal segment.  Proximal LAD has an ulcerated lesion.  There is origin of a very large D1 at the proximal segment and a large septal perforator at which point the LAD is occluded and has faint ipsilateral collaterals from the circumflex, diagonal and the SP1, and after reconstitution of the LAD, there is a focal 80% stenosis.  The diagonal 1 is very large and has proximal high-grade 80 to 90% stenosis. CX: Large vessel, giving origin to small OM1 after which it is a CTO with bridging collaterals.  Distal circumflex also receives collaterals from the LAD.  Circumflex also gives collaterals to occluded RCA. RCA: It is occluded in the midsegment after the origin of RV branch.  Distal RCA including PL and PDA branches are large and are collateralized from circumflex and LAD. Recommendation: Patient has complex multivessel disease and would benefit from CABG with arterial conduits if possible including LIMA to LAD and diagonal and RIMA to RCA and probably a free radial graft to the circumflex.  Surgical consultation has been requested.  Patient needs to be admitted inpatient in view of complex coronary anatomy and high risk for cardiac events.   Diagnostic Dominance: Right    Intervention   ECHOCARDIOGRAM COMPLETE  Result Date: 11/12/2020    ECHOCARDIOGRAM REPORT   Patient Name:   Gerald Andrade Date of Exam: 11/12/2020 Medical Rec #:  [id number]            Height:       72.0 in Accession #:    [id number]           Weight:       225.0 lb Date of Birth:  08/13/1970            BSA:          2.240 m Patient Age:    49 years             BP:           108/66 mmHg Patient Gender: M                    HR:           91 bpm. Exam Location:  Inpatient Procedure: 2D Echo and Intracardiac Opacification Agent Indications:     Chest  Pain R07.9  History:         Patient has no prior history of Echocardiogram examinations.  Sonographer:  Thurman Coyer RDCS (AE) Referring Phys:  [id number] [facility] PATWARDHAN Diagnosing Phys: Truett Mainland MD IMPRESSIONS  1. Left ventricular ejection fraction, by estimation, is 55 to 60%. The left ventricle has normal function. The left ventricle has no regional wall motion abnormalities. Left ventricular diastolic parameters were normal.  2. Right ventricular systolic function is normal. The right ventricular size is normal.  3. The mitral valve is grossly normal. No evidence of mitral valve regurgitation.  4. The aortic valve is tricuspid. Aortic valve regurgitation is not visualized. FINDINGS  Left Ventricle: Left ventricular ejection fraction, by estimation, is 55 to 60%. The left ventricle has normal function. The left ventricle has no regional wall motion abnormalities. Definity contrast agent was given IV to delineate the left ventricular  endocardial borders. The left ventricular internal cavity size was normal in size. There is no left ventricular hypertrophy. Left ventricular diastolic parameters were normal. Right Ventricle: The right ventricular size is normal. No increase in right ventricular wall thickness. Right ventricular systolic function is normal. Left Atrium: Left atrial size was normal in size. Right Atrium: Right atrial size was normal in size. Pericardium: There is no evidence of pericardial effusion. Mitral Valve: The mitral valve is grossly normal. No evidence of mitral valve regurgitation. Tricuspid Valve: The tricuspid valve is grossly normal. Tricuspid valve regurgitation is not demonstrated. Aortic Valve: The aortic valve is tricuspid. Aortic valve regurgitation is not visualized. Pulmonic Valve: The pulmonic valve was grossly normal. Pulmonic valve regurgitation is not visualized. Aorta: The aortic root and ascending aorta are structurally normal, with no evidence of  dilitation. Venous: The inferior vena cava was not well visualized. IAS/Shunts: No atrial level shunt detected by color flow Doppler.  LEFT VENTRICLE PLAX 2D LVIDd:         5.30 cm  Diastology LVIDs:         3.70 cm  LV e' medial:    7.40 cm/s LV PW:         0.90 cm  LV E/e' medial:  12.6 LV IVS:        0.80 cm  LV e' lateral:   9.14 cm/s LVOT diam:     2.30 cm  LV E/e' lateral: 10.2 LV SV:         81 LV SV Index:   36 LVOT Area:     4.15 cm  RIGHT VENTRICLE RV S prime:     20.20 cm/s TAPSE (M-mode): 2.4 cm LEFT ATRIUM             Index       RIGHT ATRIUM           Index LA diam:        4.50 cm 2.01 cm/m  RA Area:     12.20 cm LA Vol (A2C):   46.4 ml 20.71 ml/m RA Volume:   26.60 ml  11.88 ml/m LA Vol (A4C):   37.5 ml 16.74 ml/m LA Biplane Vol: 41.9 ml 18.71 ml/m  AORTIC VALVE LVOT Vmax:   95.80 cm/s LVOT Vmean:  69.600 cm/s LVOT VTI:    0.196 m  AORTA Ao Root diam: 3.30 cm MITRAL VALVE MV Area (PHT): 4.06 cm    SHUNTS MV Decel Time: 187 msec    Systemic VTI:  0.20 m MV E velocity: 93.50 cm/s  Systemic Diam: 2.30 cm MV A velocity: 88.60 cm/s MV E/A ratio:  1.06 Manish Patwardhan MD Electronically signed by Truett Mainland MD Signature Date/Time: 11/12/2020/10:47:43 AM  Final      I have independently reviewed the above radiologic studies and discussed with the patient   Recent Lab Findings: Lab Results  Component Value Date   WBC 9.6 11/12/2020   HGB 14.2 11/12/2020   HCT 41.6 11/12/2020   PLT 201 11/12/2020   GLUCOSE 172 (H) 11/12/2020   ALT 61 (H) 11/12/2020   AST 31 11/12/2020   NA 135 11/12/2020   K 3.7 11/12/2020   CL 102 11/12/2020   CREATININE 1.16 11/12/2020   BUN 15 11/12/2020   CO2 26 11/12/2020   Assessment / Plan:   1. Coronary artery disease-on Nitro drip.Heparin drip will be restarted this evening. Patient would likely benefit from coronary artery revascularization. Dr. Cornelius Moras to evaluate for coronary artery bypass grafting surgery. Patient tentatively on schedule for  Thursday 05/05 2. History of hypertension-on Olmesartan/Amlodipine/HCTZ prior to admission 3. History of probable COPD-previous tobacco abuse, on Albuterol PRN 4. History of fatty liver-ALT mildly elevated at 61    I  spent 20 minutes counseling the patient face to face.   Doree Fudge PA-C 11/12/2020 4:18 PM

## 2020-11-12 NOTE — Progress Notes (Signed)
ANTICOAGULATION CONSULT NOTE - Initial Consult  Pharmacy Consult for heparin Indication: chest pain/ACS  Allergies  Allergen Reactions  . Shrimp [Shellfish Allergy] Swelling    Chest tighten     Patient Measurements: Height: 6' (182.9 cm) Weight: 102.1 kg (225 lb) IBW/kg (Calculated) : 77.6 Heparin Dosing Weight: 100kg  Vital Signs: Temp: 97.5 F (36.4 C) (05/03 0346) Temp Source: Oral (05/03 0346) BP: 119/67 (05/03 0615) Pulse Rate: 95 (05/03 0615)  Labs: Recent Labs    11/12/20 0350  HGB 14.2  HCT 41.6  PLT 201  CREATININE 1.16  TROPONINIHS 20*    Estimated Creatinine Clearance: 94.2 mL/min (by C-G formula based on SCr of 1.16 mg/dL).   Assessment: 51yo male c/o CP radiating to jaw and that is worse with inspiration, CT negative for PE but initial troponin mildly elevated, to begin heparin.  Goal of Therapy:  Heparin level 0.3-0.7 units/ml Monitor platelets by anticoagulation protocol: Yes   Plan:  Will give heparin 4000 units IV bolus x1 followed by gtt at 1400 units/hr and monitor heparin levels and CBC.  Vernard Gambles, PharmD, BCPS  11/12/2020,6:46 AM

## 2020-11-12 NOTE — Progress Notes (Deleted)
ANTICOAGULATION CONSULT NOTE  Pharmacy Consult for heparin Indication: chest pain/ACS  Allergies  Allergen Reactions  . Shrimp [Shellfish Allergy] Swelling    Chest tighten     Patient Measurements: Height: 6' (182.9 cm) Weight: 102.1 kg (225 lb) IBW/kg (Calculated) : 77.6 Heparin Dosing Weight: 100kg  Vital Signs: Temp: 99 F (37.2 C) (05/03 1448) Temp Source: Oral (05/03 1448) BP: 122/80 (05/03 1448) Pulse Rate: 91 (05/03 1448)  Labs: Recent Labs    11/12/20 0350 11/12/20 0551  HGB 14.2  --   HCT 41.6  --   PLT 201  --   CREATININE 1.16  --   TROPONINIHS 20* 19*    Estimated Creatinine Clearance: 94.2 mL/min (by C-G formula based on SCr of 1.16 mg/dL).   Assessment: 50yo male c/o CP radiating to jaw and that is worse with inspiration, CT negative for PE but initial troponin mildly elevated, to begin heparin.  Cath 5/3 finding complex multivessel CAD - plan to restart heparin 8 hr post-sheath (sheath removed 5/3@1420 ). CBC stable. No s/sx of bleeding.  Goal of Therapy:  Heparin level 0.3-0.7 units/ml Monitor platelets by anticoagulation protocol: Yes   Plan:  Restart heparin infusion at 1400 units/hr on 5/3@2230  Order heparin level 6 hr after restart Monitor CBC, s/sx of bleeding and HL daily F/u CABG eval   Sherron Monday, PharmD, BCCCP Clinical Pharmacist  Phone: [phone] 11/12/2020 3:09 PM  Please check AMION for all [facility] Pharmacy phone numbers After 10:00 PM, call Main Pharmacy [phone]

## 2020-11-12 NOTE — H&P (Addendum)
Gerald Andrade is an 50 y.o. male.   Chief Complaint: Chest pain HPI:   49 y.o. Caucasian male  with family h/o CAD, former smoker, presented with waxing and waning retrosternal chest pain since 1:30 AM. Pain is partially responsive to NTG.  Patient initially underwent CT angiogram chest in the ER to rule out pulmonary embolism.  While he did not have any PE, he did have significant coronary atherosclerosis.  Patient was having 5 out of 10 chest pain on my evaluation.  Serial EKG showed subtle ST elevation in lead V2 with minimal ST depression in lead II and III, not meeting STEMI criteria without any dynamic changes.  Pain is improved with fentanyl as well as IV nitroglycerin.  High-sensitivity troponin mildly increased and flat at 20 and 19.  Past Medical History:  Diagnosis Date  . Fatty liver     History reviewed. No pertinent surgical history.   Family History  Problem Relation Age of Onset  . CAD Mother   . CAD Father     Social History:  reports that he has quit smoking. His smoking use included cigarettes. He does not have any smokeless tobacco history on file. He reports that he does not drink alcohol and does not use drugs.  Allergies:  Allergies  Allergen Reactions  . Shrimp [Shellfish Allergy] Swelling    Chest tighten     Review of Systems  Constitutional: Negative for decreased appetite, malaise/fatigue, weight gain and weight loss.  HENT: Negative for congestion.   Eyes: Negative for visual disturbance.  Cardiovascular: Positive for chest pain. Negative for dyspnea on exertion, leg swelling, palpitations and syncope.  Respiratory: Negative for cough.   Endocrine: Negative for cold intolerance.  Hematologic/Lymphatic: Does not bruise/bleed easily.  Skin: Negative for itching and rash.  Musculoskeletal: Negative for myalgias.  Gastrointestinal: Negative for abdominal pain, nausea and vomiting.  Genitourinary: Negative for dysuria.  Neurological: Negative  for dizziness and weakness.  Psychiatric/Behavioral: The patient is not nervous/anxious.   All other systems reviewed and are negative.    Blood pressure 105/67, pulse 93, temperature 97.8 F (36.6 C), temperature source Oral, resp. rate 15, height 6' (1.829 m), weight 102.1 kg, SpO2 100 %. Body mass index is 30.52 kg/m.  Physical Exam Vitals and nursing note reviewed.  Constitutional:      General: He is not in acute distress.    Appearance: He is well-developed.  HENT:     Head: Normocephalic and atraumatic.  Eyes:     Conjunctiva/sclera: Conjunctivae normal.     Pupils: Pupils are equal, round, and reactive to light.  Neck:     Vascular: No JVD.  Cardiovascular:     Rate and Rhythm: Normal rate and regular rhythm.     Pulses: Normal pulses and intact distal pulses.     Heart sounds: No murmur heard.   Pulmonary:     Effort: Pulmonary effort is normal.     Breath sounds: Normal breath sounds. No wheezing or rales.  Abdominal:     General: Bowel sounds are normal.     Palpations: Abdomen is soft.     Tenderness: There is no rebound.  Musculoskeletal:        General: No tenderness. Normal range of motion.     Right lower leg: No edema.     Left lower leg: No edema.  Lymphadenopathy:     Cervical: No cervical adenopathy.  Skin:    General: Skin is warm and dry.  Neurological:  Mental Status: He is alert and oriented to person, place, and time.     Cranial Nerves: No cranial nerve deficit.     Results for orders placed or performed during the hospital encounter of 11/12/20 (from the past 48 hour(s))  CBC with Differential     Status: Abnormal   Collection Time: 11/12/20  3:50 AM  Result Value Ref Range   WBC 9.6 4.0 - 10.5 K/uL   RBC 4.70 4.22 - 5.81 MIL/uL   Hemoglobin 14.2 13.0 - 17.0 g/dL   HCT 66.4 40.3 - 47.4 %   MCV 88.5 80.0 - 100.0 fL   MCH 30.2 26.0 - 34.0 pg   MCHC 34.1 30.0 - 36.0 g/dL   RDW 25.9 56.3 - 87.5 %   Platelets 201 150 - 400 K/uL    nRBC 0.0 0.0 - 0.2 %   Neutrophils Relative % 66 %   Neutro Abs 6.3 1.7 - 7.7 K/uL   Lymphocytes Relative 22 %   Lymphs Abs 2.2 0.7 - 4.0 K/uL   Monocytes Relative 11 %   Monocytes Absolute 1.1 (H) 0.1 - 1.0 K/uL   Eosinophils Relative 1 %   Eosinophils Absolute 0.1 0.0 - 0.5 K/uL   Basophils Relative 0 %   Basophils Absolute 0.0 0.0 - 0.1 K/uL   Immature Granulocytes 0 %   Abs Immature Granulocytes 0.03 0.00 - 0.07 K/uL    Comment: Performed at [facility], [address] [place], [place] [address]  Comprehensive metabolic panel     Status: Abnormal   Collection Time: 11/12/20  3:50 AM  Result Value Ref Range   Sodium 135 135 - 145 mmol/L   Potassium 3.7 3.5 - 5.1 mmol/L   Chloride 102 98 - 111 mmol/L   CO2 26 22 - 32 mmol/L   Glucose, Bld 172 (H) 70 - 99 mg/dL    Comment: Glucose reference range applies only to samples taken after fasting for at least 8 hours.   BUN 15 6 - 20 mg/dL   Creatinine, Ser 9.51 0.61 - 1.24 mg/dL   Calcium 9.2 8.9 - 88.4 mg/dL   Total Protein 6.7 6.5 - 8.1 g/dL   Albumin 3.6 3.5 - 5.0 g/dL   AST 31 15 - 41 U/L   ALT 61 (H) 0 - 44 U/L   Alkaline Phosphatase 57 38 - 126 U/L   Total Bilirubin 1.1 0.3 - 1.2 mg/dL   GFR, Estimated >16 >60 mL/min    Comment: (NOTE) Calculated using the CKD-EPI Creatinine Equation (2021)    Anion gap 7 5 - 15    Comment: Performed at [facility], [address], [place], [place] [address]  Troponin I (High Sensitivity)     Status: Abnormal   Collection Time: 11/12/20  3:50 AM  Result Value Ref Range   Troponin I (High Sensitivity) 20 (H) <18 ng/L    Comment: (NOTE) Elevated high sensitivity troponin I (hsTnI) values and significant  changes across serial measurements may suggest ACS but many other  chronic and acute conditions are known to elevate hsTnI results.  Refer to the "Links" section for chest pain algorithms and additional  guidance. Performed at [facility], [address]  [place], [place] [address]   Troponin I (High Sensitivity)     Status: Abnormal   Collection Time: 11/12/20  5:51 AM  Result Value Ref Range   Troponin I (High Sensitivity) 19 (H) <18 ng/L    Comment: (  NOTE) Elevated high sensitivity troponin I (hsTnI) values and significant  changes across serial measurements may suggest ACS but many other  chronic and acute conditions are known to elevate hsTnI results.  Refer to the "Links" section for chest pain algorithms and additional  guidance. Performed at [facility], [address], [place], [place] [address]     Labs:   Lab Results  Component Value Date   WBC 9.6 11/12/2020   HGB 14.2 11/12/2020   HCT 41.6 11/12/2020   MCV 88.5 11/12/2020   PLT 201 11/12/2020    Recent Labs  Lab 11/12/20 0350  NA 135  K 3.7  CL 102  CO2 26  BUN 15  CREATININE 1.16  CALCIUM 9.2  PROT 6.7  BILITOT 1.1  ALKPHOS 57  ALT 61*  AST 31  GLUCOSE 172*    Lipid Panel  No results found for: CHOL, TRIG, HDL, CHOLHDL, VLDL, LDLCALC  BNP (last 3 results) No results for input(s): BNP in the last 8760 hours.  HEMOGLOBIN A1C No results found for: HGBA1C, MPG  Cardiac Panel (last 3 results) No results for input(s): CKTOTAL, CKMB, RELINDX in the last 8760 hours.  Invalid input(s): TROPONINHS  No results found for: CKTOTAL, CKMB, CKMBINDEX   TSH No results for input(s): TSH in the last 8760 hours.   (Not in a hospital admission)     Current Facility-Administered Medications:  .  acetaminophen (TYLENOL) tablet 650 mg, 650 mg, Oral, Q4H PRN, Aviela Blundell J, MD .  Melene Muller ON 11/13/2020] aspirin EC tablet 81 mg, 81 mg, Oral, Daily, Carston Riedl J, MD .  heparin ADULT infusion 100 units/mL (25000 units/214mL), 1,400 Units/hr, Intravenous, Continuous, Juliette Mangle, RPH, Last Rate: 14 mL/hr at 11/12/20 0731, 1,400 Units/hr at 11/12/20 0731 .  nitroGLYCERIN (NITROSTAT) SL tablet 0.4 mg, 0.4 mg, Sublingual, Q5 Min x 3 PRN,  Dlisa Barnwell J, MD .  nitroGLYCERIN 50 mg in dextrose 5 % 250 mL (0.2 mg/mL) infusion, 0-200 mcg/min, Intravenous, Continuous, Couture, Cortni S, PA-C, Last Rate: 3 mL/hr at 11/12/20 0826, 10 mcg/min at 11/12/20 0826 .  ondansetron (ZOFRAN) injection 4 mg, 4 mg, Intravenous, Q6H PRN, Jazzlin Clements J, MD  Current Outpatient Medications:  .  albuterol (PROVENTIL HFA;VENTOLIN HFA) 108 (90 BASE) MCG/ACT inhaler, Inhale 2 puffs into the lungs every 6 (six) hours as needed for wheezing or shortness of breath., Disp: , Rfl:  .  amphetamine-dextroamphetamine (ADDERALL) 20 MG tablet, Take 20 mg by mouth daily., Disp: , Rfl:  .  Cyanocobalamin (VITAMIN B 12 PO), Take 1 tablet by mouth daily., Disp: , Rfl:  .  diazepam (VALIUM) 10 MG tablet, Take 10 mg by mouth at bedtime as needed for anxiety or sleep., Disp: , Rfl:  .  diphenhydrAMINE (BENADRYL) 25 MG tablet, Take 25 mg by mouth every 6 (six) hours as needed for allergies., Disp: , Rfl:  .  montelukast (SINGULAIR) 10 MG tablet, Take 1 tablet by mouth at bedtime., Disp: , Rfl:  .  Olmesartan-amLODIPine-HCTZ 40-10-25 MG TABS, Take 0.5 tablets by mouth daily., Disp: , Rfl:  .  OVER THE COUNTER MEDICATION, Take 2 tablets by mouth daily. T Male, Disp: , Rfl:  .  Vitamin D, Cholecalciferol, 25 MCG (1000 UT) TABS, Take 1,000 Units by mouth daily., Disp: , Rfl:  .  HYDROcodone-acetaminophen (NORCO) 10-325 MG per tablet, Take 1 tablet by mouth every 6 (six) hours as needed for moderate pain., Disp: , Rfl:    Today's Vitals  11/12/20 0800 11/12/20 0815 11/12/20 0830 11/12/20 0840  BP: 116/71 109/68 105/67   Pulse: 95 98 93   Resp: 20 20 15    Temp: 97.8 F (36.6 C)     TempSrc: Oral     SpO2: 97% 98% 100%   Weight:      Height:      PainSc:    3    Body mass index is 30.52 kg/m.   CARDIAC STUDIES:  EKG 11/12/2020: Sinus rhythm.  Subtle ST elevation in lead V2, subtle ST depression in lead II, III not meeting STEMI  criteria  Echocardiogram pending:   Assessment/Plan  50 y.o. Caucasian male  with family h/o CAD, former smoker, with chest pain concerning for unstable angina  Unstable angina: Subtle ST elevation in lead V2, subtle ST depression in lead II, 3, not meeting STEMI criteria, without dynamic changes.  Pain improved with IV nitroglycerin. Given absence of STEMI and improvement in chest pain, would like to delay coronary angiogram for a few hours given recent contrast load given for CT angiogram chest. Continue aspirin, heparin, IV nitroglycerin, will add Crestor 40 mg, metoprolol tartrate 12.5 mg bid. Lipid panel pending.   Elder NegusManish J Harmoni Lucus, MD Pager: [phone] Office: [phone]

## 2020-11-12 NOTE — Progress Notes (Signed)
ANTICOAGULATION CONSULT NOTE - Follow Up Consult  Pharmacy Consult for IV Heparin Indication: chest pain/ACS  Allergies  Allergen Reactions  . Shrimp [Shellfish Allergy] Swelling    Chest tighten     Patient Measurements: Height: 6' (182.9 cm) Weight: 102.1 kg (225 lb) IBW/kg (Calculated) : 77.6 Heparin Dosing Weight: 98.5 kg  Vital Signs: Temp: 99 F (37.2 C) (05/03 1448) Temp Source: Oral (05/03 1448) BP: 122/80 (05/03 1448) Pulse Rate: 91 (05/03 1448)  Labs: Recent Labs    11/12/20 0350 11/12/20 0551  HGB 14.2  --   HCT 41.6  --   PLT 201  --   CREATININE 1.16  --   TROPONINIHS 20* 19*    Estimated Creatinine Clearance: 94.2 mL/min (by C-G formula based on SCr of 1.16 mg/dL).   Assessment: 50 yr old male presented with CP radiating to jaw that was worse with inspiration. CT was negative for PE; initial troponin was mildly elevated. Pharmacy was consulted for heparin dosing. Pt was on no anticoagulant PTA.  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 Pharmacy is consulted to resume heparin 8 hrs after sheath pull (per cath lab procedure log, sheath was pulled at 1420 PM this afternoon).  Pt rec'd heparin 4000 units IV bolus, followed by heparin infusion at 1400 units/hr prior to cath. Per RN, no bleeding issues observed post cath.  H/H 14.2/41.6, plt 201  Goal of Therapy:  Heparin level 0.3-0.7 units/ml Monitor platelets by anticoagulation protocol: Yes   Plan:  Resume heparin infusion at 1400 units/hr at 22:30 PM this evening (~8 hrs after sheath removal) Check 6-hr heparin level Monitor daily heparin level, CBC Monitor for bleeding F/U CVTS plans for CABG  Vicki Mallet, PharmD, BCPS, [facility] Clinical Pharmacist 11/12/2020,2:58 PM

## 2020-11-13 ENCOUNTER — Observation Stay (HOSPITAL_COMMUNITY): Payer: Self-pay

## 2020-11-13 ENCOUNTER — Encounter (HOSPITAL_COMMUNITY): Payer: Self-pay | Admitting: Cardiology

## 2020-11-13 DIAGNOSIS — I25110 Atherosclerotic heart disease of native coronary artery with unstable angina pectoris: Secondary | ICD-10-CM

## 2020-11-13 DIAGNOSIS — E119 Type 2 diabetes mellitus without complications: Secondary | ICD-10-CM

## 2020-11-13 DIAGNOSIS — Z1810 Retained metal fragments, unspecified: Secondary | ICD-10-CM

## 2020-11-13 HISTORY — DX: Type 2 diabetes mellitus without complications: E11.9

## 2020-11-13 LAB — COMPREHENSIVE METABOLIC PANEL
ALT: 53 U/L — ABNORMAL HIGH (ref 0–44)
AST: 24 U/L (ref 15–41)
Albumin: 3.8 g/dL (ref 3.5–5.0)
Alkaline Phosphatase: 59 U/L (ref 38–126)
Anion gap: 7 (ref 5–15)
BUN: 12 mg/dL (ref 6–20)
CO2: 23 mmol/L (ref 22–32)
Calcium: 8.6 mg/dL — ABNORMAL LOW (ref 8.9–10.3)
Chloride: 104 mmol/L (ref 98–111)
Creatinine, Ser: 1 mg/dL (ref 0.61–1.24)
GFR, Estimated: >60 mL/min (ref >60)
Glucose, Bld: 157 mg/dL — ABNORMAL HIGH (ref 70–99)
Potassium: 3.8 mmol/L (ref 3.5–5.1)
Sodium: 134 mmol/L — ABNORMAL LOW (ref 135–145)
Total Bilirubin: 2.2 mg/dL — ABNORMAL HIGH (ref 0.3–1.2)
Total Protein: 6.9 g/dL (ref 6.5–8.1)

## 2020-11-13 LAB — HEMOGLOBIN A1C
Hgb A1c MFr Bld: 7.9 % — ABNORMAL HIGH (ref 4.8–5.6)
Mean Plasma Glucose: 180.03 mg/dL

## 2020-11-13 LAB — CBC
HCT: 41 % (ref 39.0–52.0)
Hemoglobin: 13.9 g/dL (ref 13.0–17.0)
MCH: 30.2 pg (ref 26.0–34.0)
MCHC: 33.9 g/dL (ref 30.0–36.0)
MCV: 88.9 fL (ref 80.0–100.0)
Platelets: 205 10*3/uL (ref 150–400)
RBC: 4.61 MIL/uL (ref 4.22–5.81)
RDW: 12.8 % (ref 11.5–15.5)
WBC: 10.7 10*3/uL — ABNORMAL HIGH (ref 4.0–10.5)
nRBC: 0 % (ref 0.0–0.2)

## 2020-11-13 LAB — TSH: TSH: 0.651 u[IU]/mL (ref 0.350–4.500)

## 2020-11-13 LAB — TYPE AND SCREEN
ABO/RH(D): A POS
Antibody Screen: NEGATIVE

## 2020-11-13 LAB — APTT: aPTT: 43 seconds — ABNORMAL HIGH (ref 24–36)

## 2020-11-13 LAB — PREALBUMIN: Prealbumin: 16.8 mg/dL — ABNORMAL LOW (ref 18–38)

## 2020-11-13 LAB — PROTIME-INR
INR: 1.1 (ref 0.8–1.2)
Prothrombin Time: 13.9 seconds (ref 11.4–15.2)

## 2020-11-13 LAB — HEPARIN LEVEL (UNFRACTIONATED): Heparin Unfractionated: 0.22 IU/mL — ABNORMAL LOW (ref 0.30–0.70)

## 2020-11-13 LAB — SURGICAL PCR SCREEN
MRSA, PCR: NEGATIVE
Staphylococcus aureus: POSITIVE — AB

## 2020-11-13 LAB — ABO/RH: ABO/RH(D): A POS

## 2020-11-13 MED ORDER — INSULIN REGULAR(HUMAN) IN NACL 100-0.9 UT/100ML-% IV SOLN
INTRAVENOUS | Status: AC
  Administered 2020-11-14: 2 [IU]/h via INTRAVENOUS
  Filled 2020-11-13: qty 100

## 2020-11-13 MED ORDER — MUPIROCIN 2 % EX OINT: 1.0000 "application " | TOPICAL_OINTMENT | Freq: Two times a day (BID) | CUTANEOUS | Status: DC

## 2020-11-13 MED ORDER — POTASSIUM CHLORIDE 2 MEQ/ML IV SOLN
80.0000 meq | INTRAVENOUS | Status: DC
  Filled 2020-11-13: qty 40

## 2020-11-13 MED ORDER — EPINEPHRINE HCL 5 MG/250ML IV SOLN IN NS
0.0000 ug/min | INTRAVENOUS | Status: DC
Start: 2020-11-14 — End: 2020-11-14
  Filled 2020-11-13: qty 250

## 2020-11-13 MED ORDER — ALBUTEROL SULFATE HFA 108 (90 BASE) MCG/ACT IN AERS
2.0000 | INHALATION_SPRAY | Freq: Four times a day (QID) | RESPIRATORY_TRACT | Status: DC | PRN
  Filled 2020-11-13: qty 6.7

## 2020-11-13 MED ORDER — CHLORHEXIDINE GLUCONATE CLOTH 2 % EX PADS
6.0000 | MEDICATED_PAD | Freq: Every day | CUTANEOUS | Status: AC
  Administered 2020-11-13 – 2020-11-17 (×5): 6 via TOPICAL

## 2020-11-13 MED ORDER — PHENYLEPHRINE HCL-NACL 20-0.9 MG/250ML-% IV SOLN
30.0000 ug/min | INTRAVENOUS | Status: AC
  Administered 2020-11-14: 20 ug/min via INTRAVENOUS
  Filled 2020-11-13: qty 250

## 2020-11-13 MED ORDER — BISACODYL 5 MG PO TBEC
5.0000 mg | DELAYED_RELEASE_TABLET | Freq: Once | ORAL | Status: AC
  Administered 2020-11-14: 5 mg via ORAL
  Filled 2020-11-13 (×2): qty 1

## 2020-11-13 MED ORDER — MAGNESIUM SULFATE 50 % IJ SOLN
40.0000 meq | INTRAMUSCULAR | Status: DC
  Filled 2020-11-13: qty 9.85

## 2020-11-13 MED ORDER — METOPROLOL SUCCINATE ER 25 MG PO TB24
25.0000 mg | ORAL_TABLET | Freq: Every day | ORAL | Status: DC
  Administered 2020-11-13: 25 mg via ORAL
  Filled 2020-11-13: qty 1

## 2020-11-13 MED ORDER — VANCOMYCIN HCL 1500 MG/300ML IV SOLN
1500.0000 mg | INTRAVENOUS | Status: AC
  Administered 2020-11-14: 1500 mg via INTRAVENOUS
  Filled 2020-11-13: qty 300

## 2020-11-13 MED ORDER — CEFAZOLIN SODIUM-DEXTROSE 2-4 GM/100ML-% IV SOLN
2.0000 g | INTRAVENOUS | Status: DC
  Filled 2020-11-13: qty 100

## 2020-11-13 MED ORDER — MUPIROCIN 2 % EX OINT
1.0000 "application " | TOPICAL_OINTMENT | Freq: Two times a day (BID) | CUTANEOUS | Status: AC
  Administered 2020-11-14 – 2020-11-18 (×9): 1 via NASAL
  Filled 2020-11-13 (×3): qty 22

## 2020-11-13 MED ORDER — MILRINONE LACTATE IN DEXTROSE 20-5 MG/100ML-% IV SOLN
0.3000 ug/kg/min | INTRAVENOUS | Status: DC
  Filled 2020-11-13: qty 100

## 2020-11-13 MED ORDER — TRANEXAMIC ACID 1000 MG/10ML IV SOLN
1.5000 mg/kg/h | INTRAVENOUS | Status: AC
  Administered 2020-11-14: 1.5 mg/kg/h via INTRAVENOUS
  Filled 2020-11-13: qty 25

## 2020-11-13 MED ORDER — SODIUM CHLORIDE 0.9 % IV SOLN
INTRAVENOUS | Status: DC
  Filled 2020-11-13: qty 30

## 2020-11-13 MED ORDER — DIAZEPAM 5 MG PO TABS
5.0000 mg | ORAL_TABLET | Freq: Two times a day (BID) | ORAL | Status: DC | PRN
  Administered 2020-11-13 – 2020-11-14 (×2): 5 mg via ORAL
  Filled 2020-11-13 (×2): qty 1

## 2020-11-13 MED ORDER — TEMAZEPAM 15 MG PO CAPS: 15.0000 mg | ORAL_CAPSULE | Freq: Once | ORAL | Status: DC | PRN

## 2020-11-13 MED ORDER — NITROGLYCERIN IN D5W 200-5 MCG/ML-% IV SOLN
2.0000 ug/min | INTRAVENOUS | Status: DC
  Filled 2020-11-13: qty 250

## 2020-11-13 MED ORDER — CHLORHEXIDINE GLUCONATE 4 % EX LIQD
60.0000 mL | Freq: Once | CUTANEOUS | Status: AC
  Administered 2020-11-14: 4 via TOPICAL
  Filled 2020-11-13: qty 15

## 2020-11-13 MED ORDER — TRANEXAMIC ACID (OHS) PUMP PRIME SOLUTION
2.0000 mg/kg | INTRAVENOUS | Status: DC
  Filled 2020-11-13: qty 1.97

## 2020-11-13 MED ORDER — DEXMEDETOMIDINE HCL IN NACL 400 MCG/100ML IV SOLN
0.1000 ug/kg/h | INTRAVENOUS | Status: AC
  Administered 2020-11-14: .3 ug/kg/h via INTRAVENOUS
  Filled 2020-11-13: qty 100

## 2020-11-13 MED ORDER — TRANEXAMIC ACID (OHS) BOLUS VIA INFUSION
15.0000 mg/kg | INTRAVENOUS | Status: AC
  Administered 2020-11-14: 1477.5 mg via INTRAVENOUS
  Filled 2020-11-13: qty 1478

## 2020-11-13 MED ORDER — CHLORHEXIDINE GLUCONATE 0.12 % MT SOLN
15.0000 mL | Freq: Once | OROMUCOSAL | Status: AC
  Administered 2020-11-14: 15 mL via OROMUCOSAL
  Filled 2020-11-13: qty 15

## 2020-11-13 MED ORDER — CEFAZOLIN SODIUM-DEXTROSE 2-4 GM/100ML-% IV SOLN
2.0000 g | INTRAVENOUS | Status: AC
  Administered 2020-11-14 (×2): 2 g via INTRAVENOUS
  Filled 2020-11-13: qty 100

## 2020-11-13 MED ORDER — METOPROLOL TARTRATE 12.5 MG HALF TABLET
12.5000 mg | ORAL_TABLET | Freq: Once | ORAL | Status: AC
  Administered 2020-11-14: 12.5 mg via ORAL
  Filled 2020-11-13: qty 1

## 2020-11-13 MED ORDER — CHLORHEXIDINE GLUCONATE 4 % EX LIQD
60.0000 mL | Freq: Once | CUTANEOUS | Status: AC
  Administered 2020-11-13: 4 via TOPICAL
  Filled 2020-11-13: qty 15

## 2020-11-13 MED ORDER — PLASMA-LYTE 148 IV SOLN
INTRAVENOUS | Status: DC
  Filled 2020-11-13: qty 2.5

## 2020-11-13 MED ORDER — VANCOMYCIN HCL 1000 MG IV SOLR
INTRAVENOUS | Status: DC
  Filled 2020-11-13: qty 1000

## 2020-11-13 MED ORDER — NOREPINEPHRINE 4 MG/250ML-% IV SOLN
0.0000 ug/min | INTRAVENOUS | Status: DC
  Filled 2020-11-13: qty 250

## 2020-11-13 NOTE — Consult Note (Addendum)
[address]       [place] [id number]             [phone]                                       [name] Medical Record #[id number] Date of Birth: 10/18/1970  Referring: Dr. Jacinto HalimGanji, MD Primary Care: Kaleen MaskElkins, Wilson Oliver, MD Primary Cardiologist:None  Chief Complaint:       Chief Complaint  Patient presents with  . Chest Pain  Reason for consultation: Multivessel CAD  History of Present Illness:     This is a 50 year old Caucasian male with a past medical history of hypertension, probable COPD, former tobacco abuse, fatty liver, and strong family history who expereienced chest pain (middle of chest and did not radiate) at 1:30 am this morning. According to patient, he had mowed 4 yards Monday and tried to rest after that.  After the initial chest pain began, he took 2 puffs of Albuterol as he thought the cause might be from pollen, which did not really help. He then took ec asa 325 mg, which again, did not alleviate the chest pain. Finally, he took two 5 mg Valium and asked his wife to call 911 as chest pain continued.  EKG showed subtle ST elevation in lead V2 with minimal ST depression in lead II and III. His chest pain did improve with Nitroglycerin. Troponin I (high sensitivity) highest has been 20. CT scan of the chest was negative for PE but did show multifocal coronary calcification. Echo showed LVEF 55-60% and no significant valvular disease. He then underwent a cardiac catheterization which showed LVEF 45%, proximal LAD to Mid LAD lesion is 100% stenosed, 1st Diag lesion is 90% stenosed, proximal Circumflex to mid Circumflx lesion is 100% stenosed, and proximal RCA to Mid RCA lesion is 100% stenosed. A cardiothoracic consultation has been requested with Dr. Cornelius Moraswen. At the time of my exam, patient was having some chest pain, on a Nitro drip. Heparin drip will be restarted in several hours. Current Activity/ Functional Status: Patient  is independent with mobility/ambulation, transfers, ADL's, IADL's.   Zubrod Score: At the time of surgery this patient's most appropriate activity status/level should be described as: [] ?    0    Normal activity, no symptoms [x] ?    1    Restricted in physical strenuous activity but ambulatory, able to do out light work [] ?    2    Ambulatory and capable of self care, unable to do work activities, up and about more than 50%  Of the time                            [] ?    3    Only limited self care, in bed greater than 50% of waking hours [] ?    4    Completely disabled, no self care, confined to bed or chair [] ?    5    Moribund      Past Medical History:  Diagnosis Date  . Fatty liver    Surgical History: History reviewed. No pertinent surgical history.  Social History       Tobacco Use  Smoking Status Former Smoker  . Types: Cigarettes  Smokeless Tobacco Not on file  Social History      Substance and Sexual Activity  Alcohol Use No  Patient is married, has 9 children and 6 grand children. He is employed as a Musician.        Allergies  Allergen Reactions  . Shrimp [Shellfish Allergy] Swelling    Chest tighten              Current Facility-Administered Medications  Medication Dose Route Frequency Provider Last Rate Last Admin  . 0.9 %  sodium chloride infusion  250 mL Intravenous PRN Patwardhan, Manish J, MD      . Melene Muller ON 11/13/2020] 0.9 %  sodium chloride infusion  250 mL Intravenous PRN Yates Decamp, MD      . 0.9% sodium chloride infusion  1 mL/kg/hr Intravenous Continuous Yates Decamp, MD 102.1 mL/hr at 11/12/20 1507 1 mL/kg/hr at 11/12/20 1507  . acetaminophen (TYLENOL) tablet 650 mg  650 mg Oral Q4H PRN Yates Decamp, MD      . Melene Muller ON 11/13/2020] aspirin EC tablet 81 mg  81 mg Oral Daily Yates Decamp, MD      . heparin ADULT infusion 100 units/mL (25000 units/261mL)  1,400 Units/hr Intravenous Continuous Patwardhan, Manish J, MD      . metoprolol  tartrate (LOPRESSOR) tablet 25 mg  25 mg Oral BID Yates Decamp, MD      . nitroGLYCERIN (NITROSTAT) SL tablet 0.4 mg  0.4 mg Sublingual Q5 Min x 3 PRN Yates Decamp, MD      . nitroGLYCERIN 50 mg in dextrose 5 % 250 mL (0.2 mg/mL) infusion  0-200 mcg/min Intravenous Continuous Yates Decamp, MD 6 mL/hr at 11/12/20 1613 20 mcg/min at 11/12/20 1613  . ondansetron (ZOFRAN) injection 4 mg  4 mg Intravenous Q6H PRN Yates Decamp, MD      . rosuvastatin (CRESTOR) tablet 40 mg  40 mg Oral Daily Yates Decamp, MD   40 mg at 11/12/20 1027  . sodium chloride flush (NS) 0.9 % injection 3 mL  3 mL Intravenous Q12H Patwardhan, Manish J, MD      . sodium chloride flush (NS) 0.9 % injection 3 mL  3 mL Intravenous PRN Patwardhan, Manish J, MD      . Melene Muller ON 11/13/2020] sodium chloride flush (NS) 0.9 % injection 3 mL  3 mL Intravenous Q12H Yates Decamp, MD      . Melene Muller ON 11/13/2020] sodium chloride flush (NS) 0.9 % injection 3 mL  3 mL Intravenous PRN Yates Decamp, MD               Medications Prior to Admission  Medication Sig Dispense Refill Last Dose  . albuterol (PROVENTIL HFA;VENTOLIN HFA) 108 (90 BASE) MCG/ACT inhaler Inhale 2 puffs into the lungs every 6 (six) hours as needed for wheezing or shortness of breath.   11/12/2020 at Unknown time  . amphetamine-dextroamphetamine (ADDERALL) 20 MG tablet Take 20 mg by mouth daily.   11/11/2020 at Unknown time  . Cyanocobalamin (VITAMIN B 12 PO) Take 1 tablet by mouth daily.   Past Week at Unknown time  . diazepam (VALIUM) 10 MG tablet Take 10 mg by mouth at bedtime as needed for anxiety or sleep.   11/12/2020 at Unknown time  . diphenhydrAMINE (BENADRYL) 25 MG tablet Take 25 mg by mouth every 6 (six) hours as needed for allergies.   11/11/2020 at Unknown time  . montelukast (SINGULAIR) 10 MG tablet Take 1 tablet by mouth at bedtime.   11/11/2020 at Unknown time  .  Olmesartan-amLODIPine-HCTZ 40-10-25 MG TABS Take 0.5 tablets by mouth daily.   11/11/2020 at Unknown time  . OVER  THE COUNTER MEDICATION Take 2 tablets by mouth daily. T Male   Past Week at Unknown time  . Vitamin D, Cholecalciferol, 25 MCG (1000 UT) TABS Take 1,000 Units by mouth daily.   Past Week at Unknown time  . HYDROcodone-acetaminophen (NORCO) 10-325 MG per tablet Take 1 tablet by mouth every 6 (six) hours as needed for moderate pain.   unk         Family History  Problem Relation Age of Onset  . CAD Mother-had MI, PCI   . CAD Father-had open heart surgery   Both mother and father are alive. Mother is 35 and father is 73 (in Hospice but lives with patient)  Review of Systems:               Cardiac Review of Systems: Y or  [ N   ]= no             Chest Pain [  Y  ]        Resting SOB [  N ]      Exertional SOB  [ N ]  Orthopnea [ N ]             Pedal Edema [ N  ]     Syncope  Klaus.Mock  ]             Presyncope [  N ]             General Review of Systems: [Y] = yes [ N ]=no Constitional:  fatigue [ Y ]; nausea Klaus.Mock  ]; night sweats N[  ]; fever [ N ]; or chills [ N ]                                                              Patient has had COVID-mild symptoms and is NOT vaccinated             Eye :  diplopia [ N  ];   Amaurosis fugax[ N ]; Resp: cough [ N ];  wheezing[N  ];  hemoptysis[N  ];  GI: vomiting[ N ];  dysphagia[ N ]; melena[N  ];  hematochezia Klaus.Mock  ];  GU: hematuria[ N ];                 Skin: peripheral edema[ N ];   Musculosketetal:  joint swelling[ N ];  joint erythema[  N];              Heme/Lymph: anemia[N  ];  Neuro: Deyanira.Kussmaul  ];   stroke[N  ];  vertigo[N  ];  seizures[ N ];   difficulty walking[N  ];              Endocrine: diabetes[ N ];  thyroid dysfunction[N  ];                        Physical Exam: BP 114/77   Pulse 86   Temp 99 F (37.2 C) (Oral)   Resp 20   Ht 6' (1.829 m)   Wt 102.1 kg   SpO2 97%   BMI 30.52 kg/m  General appearance: alert, cooperative and no distress Head: Normocephalic, without obvious abnormality, atraumatic Neck: no  carotid bruit, no JVD and supple, symmetrical, trachea midline Resp: clear to auscultation bilaterally Cardio: RRR, no murmur GI: Soft, non tender, sporadic bowel sounds Extremities: No LE edema. Palpable DP/PT bilaterally Neurologic: Grossly normal  Diagnostic Studies & Laboratory data:     Recent Radiology Findings:   DG Chest 1 View  Result Date: 11/12/2020 CLINICAL DATA:  Chest pain EXAM: CHEST  1 VIEW COMPARISON:  08/31/2011 FINDINGS: Generous heart size accentuated by low volumes. There is no edema, consolidation, effusion, or pneumothorax. Artifact from EKG leads. IMPRESSION: No evidence of active disease. Electronically Signed   By: Marnee Spring M.D.   On: 11/12/2020 05:03   CT Angio Chest PE W and/or Wo Contrast  Result Date: 11/12/2020 CLINICAL DATA:  Chest pain that is worse with inhalation and ambulation. EXAM: CT ANGIOGRAPHY CHEST WITH CONTRAST TECHNIQUE: Multidetector CT imaging of the chest was performed using the standard protocol during bolus administration of intravenous contrast. Multiplanar CT image reconstructions and MIPs were obtained to evaluate the vascular anatomy. CONTRAST:  64mL OMNIPAQUE IOHEXOL 350 MG/ML SOLN COMPARISON:  None. FINDINGS: Cardiovascular: Satisfactory opacification of the pulmonary arteries to the segmental level. No evidence of pulmonary embolism. Normal heart size. No pericardial effusion. Coronary atheromatous calcification, especially notable for age. Mediastinum/Nodes: Negative for adenopathy or mass. Lungs/Pleura: There is no edema, consolidation, effusion, or pneumothorax. Dependent atelectasis. Triangular subpleural nodule at the right minor fissure measuring 4 mm in average diameter, morphology consistent with lymph node. Upper Abdomen: Negative Musculoskeletal: Negative Review of the MIP images confirms the above findings. IMPRESSION: 1. Negative for pulmonary embolism or other acute finding. 2. Multifocal coronary calcification, notable for  age. Electronically Signed   By: Marnee Spring M.D.   On: 11/12/2020 05:42   CARDIAC CATHETERIZATION  Result Date: 11/12/2020 Left Heart Catheterization 11/12/20: LV: Mild LV systolic dysfunction, EF 45% with inferior akinesis.  No significant mitral regurgitation.  Normal EDP.  No pressure gradient across the aortic valve. LM: Large-caliber vessel, normal.  Short. LAD: Large vessel in the proximal segment.  Proximal LAD has an ulcerated lesion.  There is origin of a very large D1 at the proximal segment and a large septal perforator at which point the LAD is occluded and has faint ipsilateral collaterals from the circumflex, diagonal and the SP1, and after reconstitution of the LAD, there is a focal 80% stenosis.  The diagonal 1 is very large and has proximal high-grade 80 to 90% stenosis. CX: Large vessel, giving origin to small OM1 after which it is a CTO with bridging collaterals.  Distal circumflex also receives collaterals from the LAD.  Circumflex also gives collaterals to occluded RCA. RCA: It is occluded in the midsegment after the origin of RV branch.  Distal RCA including PL and PDA branches are large and are collateralized from circumflex and LAD. Recommendation: Patient has complex multivessel disease and would benefit from CABG with arterial conduits if possible including LIMA to LAD and diagonal and RIMA to RCA and probably a free radial graft to the circumflex.  Surgical consultation has been requested.  Patient needs to be admitted inpatient in view of complex coronary anatomy and high risk for cardiac events.   Diagnostic Dominance: Right    Intervention   ECHOCARDIOGRAM COMPLETE  Result Date: 11/12/2020    ECHOCARDIOGRAM REPORT   Patient Name:   Gerald Andrade Neuenfeldt Date of Exam: 11/12/2020 Medical Rec #:  [id number]  Height:       72.0 in Accession #:    [id number]           Weight:       225.0 lb Date of Birth:  03/06/1971            BSA:          2.240 m Patient  Age:    49 years             BP:           108/66 mmHg Patient Gender: M                    HR:           91 bpm. Exam Location:  Inpatient Procedure: 2D Echo and Intracardiac Opacification Agent Indications:     Chest Pain R07.9  History:         Patient has no prior history of Echocardiogram examinations.  Sonographer:     Thurman Coyer RDCS (AE) Referring Phys:  [id number] [facility] PATWARDHAN Diagnosing Phys: Truett Mainland MD IMPRESSIONS  1. Left ventricular ejection fraction, by estimation, is 55 to 60%. The left ventricle has normal function. The left ventricle has no regional wall motion abnormalities. Left ventricular diastolic parameters were normal.  2. Right ventricular systolic function is normal. The right ventricular size is normal.  3. The mitral valve is grossly normal. No evidence of mitral valve regurgitation.  4. The aortic valve is tricuspid. Aortic valve regurgitation is not visualized. FINDINGS  Left Ventricle: Left ventricular ejection fraction, by estimation, is 55 to 60%. The left ventricle has normal function. The left ventricle has no regional wall motion abnormalities. Definity contrast agent was given IV to delineate the left ventricular  endocardial borders. The left ventricular internal cavity size was normal in size. There is no left ventricular hypertrophy. Left ventricular diastolic parameters were normal. Right Ventricle: The right ventricular size is normal. No increase in right ventricular wall thickness. Right ventricular systolic function is normal. Left Atrium: Left atrial size was normal in size. Right Atrium: Right atrial size was normal in size. Pericardium: There is no evidence of pericardial effusion. Mitral Valve: The mitral valve is grossly normal. No evidence of mitral valve regurgitation. Tricuspid Valve: The tricuspid valve is grossly normal. Tricuspid valve regurgitation is not demonstrated. Aortic Valve: The aortic valve is tricuspid. Aortic valve regurgitation  is not visualized. Pulmonic Valve: The pulmonic valve was grossly normal. Pulmonic valve regurgitation is not visualized. Aorta: The aortic root and ascending aorta are structurally normal, with no evidence of dilitation. Venous: The inferior vena cava was not well visualized. IAS/Shunts: No atrial level shunt detected by color flow Doppler.  LEFT VENTRICLE PLAX 2D LVIDd:         5.30 cm  Diastology LVIDs:         3.70 cm  LV e' medial:    7.40 cm/s LV PW:         0.90 cm  LV E/e' medial:  12.6 LV IVS:        0.80 cm  LV e' lateral:   9.14 cm/s LVOT diam:     2.30 cm  LV E/e' lateral: 10.2 LV SV:         81 LV SV Index:   36 LVOT Area:     4.15 cm  RIGHT VENTRICLE RV S prime:     20.20 cm/s TAPSE (M-mode): 2.4 cm LEFT ATRIUM  Index       RIGHT ATRIUM           Index LA diam:        4.50 cm 2.01 cm/m  RA Area:     12.20 cm LA Vol (A2C):   46.4 ml 20.71 ml/m RA Volume:   26.60 ml  11.88 ml/m LA Vol (A4C):   37.5 ml 16.74 ml/m LA Biplane Vol: 41.9 ml 18.71 ml/m  AORTIC VALVE LVOT Vmax:   95.80 cm/s LVOT Vmean:  69.600 cm/s LVOT VTI:    0.196 m  AORTA Ao Root diam: 3.30 cm MITRAL VALVE MV Area (PHT): 4.06 cm    SHUNTS MV Decel Time: 187 msec    Systemic VTI:  0.20 m MV E velocity: 93.50 cm/s  Systemic Diam: 2.30 cm MV A velocity: 88.60 cm/s MV E/A ratio:  1.06 Manish Patwardhan MD Electronically signed by Truett Mainland MD Signature Date/Time: 11/12/2020/10:47:43 AM    Final      I have independently reviewed the above radiologic studies and discussed with the patient   Recent Lab Findings: Recent Labs       Lab Results  Component Value Date   WBC 9.6 11/12/2020   HGB 14.2 11/12/2020   HCT 41.6 11/12/2020   PLT 201 11/12/2020   GLUCOSE 172 (H) 11/12/2020   ALT 61 (H) 11/12/2020   AST 31 11/12/2020   NA 135 11/12/2020   K 3.7 11/12/2020   CL 102 11/12/2020   CREATININE 1.16 11/12/2020   BUN 15 11/12/2020   CO2 26 11/12/2020     Assessment / Plan:   1. Coronary  artery disease-on Nitro drip.Heparin drip will be restarted this evening. Patient would likely benefit from coronary artery revascularization. Dr. Cornelius Moras to evaluate for coronary artery bypass grafting surgery. Patient tentatively on schedule for Thursday 05/05 2. History of hypertension-on Olmesartan/Amlodipine/HCTZ prior to admission 3. History of probable COPD-previous tobacco abuse, on Albuterol PRN 4. History of fatty liver-ALT mildly elevated at 61    I  spent 20 minutes counseling the patient face to face.   Doree Fudge PA-C 11/12/2020 4:18 PM   I have seen and examined the patient and agree with the assessment and plan as outlined by Doree Fudge, PA-C  Patient is a 50 year old mildly obese male with no previous history of coronary artery disease but risk factors notable for history of hypertension and a strong family history of coronary artery disease.  He presents with greater than 70-month history of exertional shortness of breath and sudden onset substernal chest pain consistent with unstable angina pectoris.  I have personally reviewed the patient's history, admission laboratory data and diagnostic tests including CT scan, transthoracic echocardiogram and diagnostic cardiac catheterization.  CT angiogram of the chest was negative for pulmonary embolus and otherwise remarkable only for the presence of coronary calcification.  Transthoracic echocardiogram reveals normal left ventricular systolic function with no significant wall motion abnormalities and normal functioning heart valves.  Diagnostic cardiac catheterization reveals severe three-vessel coronary artery disease with mildly reduced left ventricular function.  Patient appears to have reasonably good distal target vessels for grafting.  I agree the patient would best be treated with surgical revascularization.  I have reviewed the indications, risks, and potential benefits of coronary artery bypass grafting with the  patient and his wife at the bedside.  Alternative treatment strategies have been discussed, including the relative risks, benefits and long term prognosis associated with medical therapy, percutaneous coronary intervention, and surgical revascularization.  The patient understands and accepts all potential associated risks of surgery including but not limited to risk of death, stroke or other neurologic complication, myocardial infarction, congestive heart failure, respiratory failure, renal failure, bleeding requiring blood transfusion and/or reexploration, aortic dissection or other major vascular complication, arrhythmia, heart block or bradycardia requiring permanent pacemaker, pneumonia, pleural effusion, wound infection, pulmonary embolus or other thromboembolic complication, chronic pain or other delayed complications related to median sternotomy, or the late recurrence of symptomatic ischemic heart disease and/or congestive heart failure.  The importance of long term risk modification have been emphasized.  All questions answered.  We will plan for surgery first case tomorrow.   I spent in excess of 60 minutes during the conduct of this hospital encounter and >50% of this time involved direct face-to-face encounter with the patient for counseling and/or coordination of their care.                  Purcell Nails, MD 11/13/2020 9:20 AM

## 2020-11-13 NOTE — Progress Notes (Signed)
CARDIAC REHAB PHASE I   Preop education completed with pt and wife. Pt given cardiac surgery booklet and [facility] care guide. Pt and wife overwhelmed. Provided support and encouragement. Pt requesting medicine for anxiety, RN made aware. Will continue to follow throughout his hospital stay.  [id number] Reynold Bowen, RN BSN 11/13/2020 11:04 AM

## 2020-11-13 NOTE — Progress Notes (Signed)
ANTICOAGULATION CONSULT NOTE - Follow Up Consult  Pharmacy Consult for heparin Indication: CAD awaiting CABG  Labs: Recent Labs    11/12/20 0350 11/12/20 0551 11/13/20 0611  HGB 14.2  --  13.9  HCT 41.6  --  41.0  PLT 201  --  205  APTT  --   --  43*  LABPROT  --   --  13.9  INR  --   --  1.1  HEPARINUNFRC  --   --  0.22*  CREATININE 1.16  --   --   TROPONINIHS 20* 19*  --     Assessment: 50yo male subtherapeutic on heparin after resuming post-cath; no gtt issues or signs of bleeding per RN.  Goal of Therapy:  Heparin level 0.3-0.7 units/ml   Plan:  Will increase heparin gtt by 2 units/kg/hr to 1600 units/hr and check level in 6 hours.    Vernard Gambles, PharmD, BCPS  11/13/2020,6:54 AM

## 2020-11-13 NOTE — Anesthesia Preprocedure Evaluation (Addendum)
Anesthesia Evaluation  Patient identified by MRN, date of birth, ID band Patient awake    Reviewed: Allergy & Precautions, NPO status , Patient's Chart, lab work & pertinent test results  Airway Mallampati: II  TM Distance: >3 FB Neck ROM: Full    Dental  (+) Teeth Intact   Pulmonary neg pulmonary ROS, former smoker,    Pulmonary exam normal        Cardiovascular hypertension, Pt. on medications + angina + CAD   Rhythm:Regular Rate:Normal     Neuro/Psych negative neurological ROS  negative psych ROS   GI/Hepatic negative GI ROS, Neg liver ROS,   Endo/Other  negative endocrine ROS  Renal/GU negative Renal ROS  negative genitourinary   Musculoskeletal negative musculoskeletal ROS (+)   Abdominal (+)  Abdomen: soft. Bowel sounds: normal.  Peds  Hematology negative hematology ROS (+)   Anesthesia Other Findings   Reproductive/Obstetrics                            Anesthesia Physical Anesthesia Plan  ASA: IV  Anesthesia Plan: General   Post-op Pain Management:    Induction: Intravenous  PONV Risk Score and Plan: 2 and Ondansetron, Midazolam and Treatment may vary due to age or medical condition  Airway Management Planned: Mask and Oral ETT  Additional Equipment: Arterial line, CVP, PA Cath, TEE, 3D TEE and Ultrasound Guidance Line Placement  Intra-op Plan:   Post-operative Plan: Post-operative intubation/ventilation  Informed Consent: I have reviewed the patients History and Physical, chart, labs and discussed the procedure including the risks, benefits and alternatives for the proposed anesthesia with the patient or authorized representative who has indicated his/her understanding and acceptance.     Dental advisory given  Plan Discussed with: CRNA  Anesthesia Plan Comments: (Lab Results      Component                Value               Date                      WBC                       10.7 (H)            11/13/2020                HGB                      13.9                11/13/2020                HCT                      41.0                11/13/2020                MCV                      88.9                11/13/2020                PLT  205                 11/13/2020           Lab Results      Component                Value               Date                      NA                       134 (L)             11/13/2020                K                        3.8                 11/13/2020                CO2                      23                  11/13/2020                GLUCOSE                  157 (H)             11/13/2020                BUN                      12                  11/13/2020                CREATININE               1.00                11/13/2020                CALCIUM                  8.6 (L)             11/13/2020                GFRNONAA                 >60                 11/13/2020            ECHO 11/12/20: 1. Left ventricular ejection fraction, by estimation, is 55 to 60%. The  left ventricle has normal function. The left ventricle has no regional  wall motion abnormalities. Left ventricular diastolic parameters were  normal.  2. Right ventricular systolic function is normal. The right ventricular  size is normal.  3. The mitral valve is grossly normal. No evidence of mitral valve  regurgitation.  4. The aortic valve is tricuspid. Aortic valve regurgitation is not  visualized.  Left Heart Catheterization 11/12/20:  LV: Mild LV systolic dysfunction, EF 45% with inferior akinesis.  No significant mitral  regurgitation.  Normal EDP.  No pressure gradient across the aortic valve. LM: Large-caliber vessel, normal.  Short. LAD: Large vessel in the proximal segment.  Proximal LAD has an ulcerated lesion.  There is origin of a very large D1 at the proximal segment and a large septal perforator at which  point the LAD is occluded and has faint ipsilateral collaterals from the circumflex, diagonal and the SP1, and after reconstitution of the LAD, there is a focal 80% stenosis.  The diagonal 1 is very large and has proximal high-grade 80 to 90% stenosis. CX: Large vessel, giving origin to small OM1 after which it is a CTO with bridging collaterals.  Distal circumflex also receives collaterals from the LAD.  Circumflex also gives collaterals to occluded RCA. RCA: It is occluded in the midsegment after the origin of RV branch.  Distal RCA including PL and PDA branches are large and are collateralized from circumflex and LAD. )       Anesthesia Quick Evaluation

## 2020-11-13 NOTE — Progress Notes (Signed)
Subjective:  Chest pain improved Currently on 50 mics of nitro.  Objective:  Vital Signs in the last 24 hours: Temp:  [98.9 F (37.2 C)-99 F (37.2 C)] 98.9 F (37.2 C) (05/04 0532) Pulse Rate:  [0-106] 105 (05/04 0851) Resp:  [0-34] 19 (05/04 0532) BP: (97-130)/(62-87) 130/67 (05/04 0851) SpO2:  [0 %-100 %] 98 % (05/04 0532) Weight:  [98.5 kg-99.3 kg] 98.5 kg (05/04 0600)  Intake/Output from previous day: 05/03 0701 - 05/04 0700 In: 2074 [I.V.:841.9; IV Piggyback:1232.1] Out: 1850 [Urine:1850]  Physical Exam Vitals and nursing note reviewed.  Constitutional:      General: He is not in acute distress.    Appearance: He is well-developed.  HENT:     Head: Normocephalic and atraumatic.  Eyes:     Conjunctiva/sclera: Conjunctivae normal.     Pupils: Pupils are equal, round, and reactive to light.  Neck:     Vascular: No JVD.  Cardiovascular:     Rate and Rhythm: Normal rate and regular rhythm.     Pulses: Normal pulses and intact distal pulses.     Heart sounds: No murmur heard.   Pulmonary:     Effort: Pulmonary effort is normal.     Breath sounds: Normal breath sounds. No wheezing or rales.  Abdominal:     General: Bowel sounds are normal.     Palpations: Abdomen is soft.     Tenderness: There is no rebound.  Musculoskeletal:        General: No tenderness. Normal range of motion.     Left lower leg: No edema.  Lymphadenopathy:     Cervical: No cervical adenopathy.  Skin:    General: Skin is warm and dry.  Neurological:     Mental Status: He is alert and oriented to person, place, and time.     Cranial Nerves: No cranial nerve deficit.      Lab Results: BMP Recent Labs    11/12/20 0350 11/13/20 0611  NA 135 134*  K 3.7 3.8  CL 102 104  CO2 26 23  GLUCOSE 172* 157*  BUN 15 12  CREATININE 1.16 1.00  CALCIUM 9.2 8.6*  GFRNONAA >60 >60    CBC Recent Labs  Lab 11/12/20 0350 11/13/20 0611  WBC 9.6 10.7*  RBC 4.70 4.61  HGB 14.2 13.9  HCT  41.6 41.0  PLT 201 205  MCV 88.5 88.9  MCH 30.2 30.2  MCHC 34.1 33.9  RDW 12.8 12.8  LYMPHSABS 2.2  --   MONOABS 1.1*  --   EOSABS 0.1  --   BASOSABS 0.0  --     HEMOGLOBIN A1C Lab Results  Component Value Date   HGBA1C 7.9 (H) 11/13/2020   MPG 180.03 11/13/2020    Cardiac Panel (last 3 results) Results for [place], ABIE CHEEK (MRN [id number]) as of 11/13/2020 10:50  Ref. Range 11/12/2020 03:50 11/12/2020 05:51  Troponin I (High Sensitivity) Latest Ref Range: <18 ng/L 20 (H) 19 (H)   BNP (last 3 results) N/A  TSH Recent Labs    11/13/20 0611  TSH 0.651    Lipid Panel     Component Value Date/Time   CHOL 181 11/12/2020 1501   TRIG 100 11/12/2020 1501   HDL 34 (L) 11/12/2020 1501   CHOLHDL 5.3 11/12/2020 1501   VLDL 20 11/12/2020 1501   LDLCALC 127 (H) 11/12/2020 1501     Hepatic Function Panel Recent Labs    11/12/20 0350 11/13/20 0611  PROT 6.7 6.9  ALBUMIN  3.6 3.8  AST 31 24  ALT 61* 53*  ALKPHOS 57 59  BILITOT 1.1 2.2*    Cardiac Studies:  EKG 11/12/2020: Sinus rhythm.  Subtle ST elevation in lead V2, subtle ST depression in lead II, III not meeting STEMI criteria  Echocardiogram 11/12/2020: 1. Left ventricular ejection fraction, by estimation, is 55 to 60%. The  left ventricle has normal function. Basal inferior/inferolateral hypokinesis.  Left ventricular diastolic parameters were  normal.  2. Right ventricular systolic function is normal. The right ventricular  size is normal.  3. The mitral valve is grossly normal. No evidence of mitral valve  regurgitation.  4. The aortic valve is tricuspid. Aortic valve regurgitation is not  visualized.   Left Heart Catheterization 11/12/20:  LV: Mild LV systolic dysfunction, EF 45% with inferior akinesis.  No significant mitral regurgitation.  Normal EDP.  No pressure gradient across the aortic valve. LM: Large-caliber vessel, normal.  Short. LAD: Large vessel in the proximal segment.  Proximal LAD  has an ulcerated lesion.  There is origin of a very large D1 at the proximal segment and a large septal perforator at which point the LAD is occluded and has faint ipsilateral collaterals from the circumflex, diagonal and the SP1, and after reconstitution of the LAD, there is a focal 80% stenosis.  The diagonal 1 is very large and has proximal high-grade 80 to 90% stenosis. CX: Large vessel, giving origin to small OM1 after which it is a CTO with bridging collaterals.  Distal circumflex also receives collaterals from the LAD.  Circumflex also gives collaterals to occluded RCA. RCA: It is occluded in the midsegment after the origin of RV branch.  Distal RCA including PL and PDA branches are large and are collateralized from circumflex and LAD.  Recommendation: Patient has complex multivessel disease and would benefit from CABG with arterial conduits if possible including LIMA to LAD and diagonal and RIMA to RCA and probably a free radial graft to the circumflex.  Surgical consultation has been requested.  Patient needs to be admitted inpatient in view of complex coronary anatomy and high risk for cardiac events.   Assessment & Recommendations: 50 y.o. Caucasian male  with family h/o CAD, former smoker, with chest pain concerning for unstable angina  Unstable angina: Multivessel CAD. Awaiting CABG. Continue Aspirin, heparin, statin, nitroglycerin drip currently at 50 mics    Elder Negus, MD Pager: [phone] Office: [phone]

## 2020-11-13 NOTE — Progress Notes (Signed)
Pre-CABG study completed.  ° °Please see CV Proc for preliminary results.  ° °Damiah Mcdonald, RDMS, RVT ° °

## 2020-11-14 ENCOUNTER — Inpatient Hospital Stay (HOSPITAL_COMMUNITY): Payer: Self-pay

## 2020-11-14 ENCOUNTER — Encounter (HOSPITAL_COMMUNITY): Payer: Self-pay | Admitting: Cardiology

## 2020-11-14 ENCOUNTER — Inpatient Hospital Stay (HOSPITAL_COMMUNITY): Payer: Self-pay | Admitting: Certified Registered Nurse Anesthetist

## 2020-11-14 ENCOUNTER — Inpatient Hospital Stay (HOSPITAL_COMMUNITY)
Admission: EM | Disposition: A | Discharge status: Home / Self Care | Payer: Self-pay | Source: Home / Self Care | Attending: Thoracic Surgery (Cardiothoracic Vascular Surgery)

## 2020-11-14 DIAGNOSIS — Z951 Presence of aortocoronary bypass graft: Secondary | ICD-10-CM

## 2020-11-14 HISTORY — DX: Presence of aortocoronary bypass graft: Z95.1

## 2020-11-14 HISTORY — PX: TEE WITHOUT CARDIOVERSION: SHX5443

## 2020-11-14 HISTORY — PX: RADIAL ARTERY HARVEST: SHX5067

## 2020-11-14 HISTORY — PX: CORONARY ARTERY BYPASS GRAFT: SHX141

## 2020-11-14 HISTORY — PX: ENDOVEIN HARVEST OF GREATER SAPHENOUS VEIN: SHX5059

## 2020-11-14 LAB — URINALYSIS, COMPLETE (UACMP) WITH MICROSCOPIC
Bacteria, UA: NONE SEEN
Bilirubin Urine: NEGATIVE
Glucose, UA: 150 mg/dL — AB
Hgb urine dipstick: NEGATIVE
Ketones, ur: 20 mg/dL — AB
Leukocytes,Ua: NEGATIVE
Nitrite: NEGATIVE
Protein, ur: NEGATIVE mg/dL
Specific Gravity, Urine: 1.02 (ref 1.005–1.030)
pH: 6 (ref 5.0–8.0)

## 2020-11-14 LAB — POCT I-STAT 7, (LYTES, BLD GAS, ICA,H+H)
Acid-Base Excess: 0 mmol/L (ref 0.0–2.0)
Acid-Base Excess: 0 mmol/L (ref 0.0–2.0)
Acid-base deficit: 3 mmol/L — ABNORMAL HIGH (ref 0.0–2.0)
Acid-base deficit: 2 mmol/L (ref 0.0–2.0)
Acid-base deficit: 2 mmol/L (ref 0.0–2.0)
Bicarbonate: 23.9 mmol/L (ref 20.0–28.0)
Bicarbonate: 25.2 mmol/L (ref 20.0–28.0)
Bicarbonate: 24.9 mmol/L (ref 20.0–28.0)
Bicarbonate: 22.5 mmol/L (ref 20.0–28.0)
Bicarbonate: 24 mmol/L (ref 20.0–28.0)
Calcium, Ion: 1.23 mmol/L (ref 1.15–1.40)
Calcium, Ion: 1.03 mmol/L — ABNORMAL LOW (ref 1.15–1.40)
Calcium, Ion: 0.95 mmol/L — ABNORMAL LOW (ref 1.15–1.40)
Calcium, Ion: 0.95 mmol/L — ABNORMAL LOW (ref 1.15–1.40)
Calcium, Ion: 1.08 mmol/L — ABNORMAL LOW (ref 1.15–1.40)
HCT: 39 % (ref 39.0–52.0)
HCT: 33 % — ABNORMAL LOW (ref 39.0–52.0)
HCT: 27 % — ABNORMAL LOW (ref 39.0–52.0)
HCT: 26 % — ABNORMAL LOW (ref 39.0–52.0)
HCT: 27 % — ABNORMAL LOW (ref 39.0–52.0)
Hemoglobin: 13.3 g/dL (ref 13.0–17.0)
Hemoglobin: 11.2 g/dL — ABNORMAL LOW (ref 13.0–17.0)
Hemoglobin: 9.2 g/dL — ABNORMAL LOW (ref 13.0–17.0)
Hemoglobin: 8.8 g/dL — ABNORMAL LOW (ref 13.0–17.0)
Hemoglobin: 9.2 g/dL — ABNORMAL LOW (ref 13.0–17.0)
O2 Saturation: 99 %
O2 Saturation: 100 %
O2 Saturation: 100 %
O2 Saturation: 100 %
O2 Saturation: 99 %
Potassium: 4 mmol/L (ref 3.5–5.1)
Potassium: 4.1 mmol/L (ref 3.5–5.1)
Potassium: 4.8 mmol/L (ref 3.5–5.1)
Potassium: 4.9 mmol/L (ref 3.5–5.1)
Potassium: 4.1 mmol/L (ref 3.5–5.1)
Sodium: 136 mmol/L (ref 135–145)
Sodium: 137 mmol/L (ref 135–145)
Sodium: 135 mmol/L (ref 135–145)
Sodium: 134 mmol/L — ABNORMAL LOW (ref 135–145)
Sodium: 137 mmol/L (ref 135–145)
TCO2: 25 mmol/L (ref 22–32)
TCO2: 27 mmol/L (ref 22–32)
TCO2: 26 mmol/L (ref 22–32)
TCO2: 24 mmol/L (ref 22–32)
TCO2: 25 mmol/L (ref 22–32)
pCO2 arterial: 46.8 mmHg (ref 32.0–48.0)
pCO2 arterial: 43.2 mmHg (ref 32.0–48.0)
pCO2 arterial: 40.6 mmHg (ref 32.0–48.0)
pCO2 arterial: 37.4 mmHg (ref 32.0–48.0)
pCO2 arterial: 48.2 mmHg — ABNORMAL HIGH (ref 32.0–48.0)
pH, Arterial: 7.316 — ABNORMAL LOW (ref 7.350–7.450)
pH, Arterial: 7.374 (ref 7.350–7.450)
pH, Arterial: 7.396 (ref 7.350–7.450)
pH, Arterial: 7.387 (ref 7.350–7.450)
pH, Arterial: 7.305 — ABNORMAL LOW (ref 7.350–7.450)
pO2, Arterial: 139 mmHg — ABNORMAL HIGH (ref 83.0–108.0)
pO2, Arterial: 252 mmHg — ABNORMAL HIGH (ref 83.0–108.0)
pO2, Arterial: 304 mmHg — ABNORMAL HIGH (ref 83.0–108.0)
pO2, Arterial: 189 mmHg — ABNORMAL HIGH (ref 83.0–108.0)
pO2, Arterial: 146 mmHg — ABNORMAL HIGH (ref 83.0–108.0)

## 2020-11-14 LAB — POCT I-STAT, CHEM 8
BUN: 12 mg/dL (ref 6–20)
BUN: 9 mg/dL (ref 6–20)
BUN: 10 mg/dL (ref 6–20)
BUN: 11 mg/dL (ref 6–20)
BUN: 10 mg/dL (ref 6–20)
BUN: 11 mg/dL (ref 6–20)
Calcium, Ion: 1.23 mmol/L (ref 1.15–1.40)
Calcium, Ion: 1.09 mmol/L — ABNORMAL LOW (ref 1.15–1.40)
Calcium, Ion: 1.08 mmol/L — ABNORMAL LOW (ref 1.15–1.40)
Calcium, Ion: 1.17 mmol/L (ref 1.15–1.40)
Calcium, Ion: 1.01 mmol/L — ABNORMAL LOW (ref 1.15–1.40)
Calcium, Ion: 1.08 mmol/L — ABNORMAL LOW (ref 1.15–1.40)
Chloride: 101 mmol/L (ref 98–111)
Chloride: 106 mmol/L (ref 98–111)
Chloride: 100 mmol/L (ref 98–111)
Chloride: 102 mmol/L (ref 98–111)
Chloride: 102 mmol/L (ref 98–111)
Chloride: 102 mmol/L (ref 98–111)
Creatinine, Ser: 0.8 mg/dL (ref 0.61–1.24)
Creatinine, Ser: 0.5 mg/dL — ABNORMAL LOW (ref 0.61–1.24)
Creatinine, Ser: 0.7 mg/dL (ref 0.61–1.24)
Creatinine, Ser: 0.7 mg/dL (ref 0.61–1.24)
Creatinine, Ser: 0.6 mg/dL — ABNORMAL LOW (ref 0.61–1.24)
Creatinine, Ser: 0.6 mg/dL — ABNORMAL LOW (ref 0.61–1.24)
Glucose, Bld: 146 mg/dL — ABNORMAL HIGH (ref 70–99)
Glucose, Bld: 129 mg/dL — ABNORMAL HIGH (ref 70–99)
Glucose, Bld: 128 mg/dL — ABNORMAL HIGH (ref 70–99)
Glucose, Bld: 142 mg/dL — ABNORMAL HIGH (ref 70–99)
Glucose, Bld: 156 mg/dL — ABNORMAL HIGH (ref 70–99)
Glucose, Bld: 176 mg/dL — ABNORMAL HIGH (ref 70–99)
HCT: 35 % — ABNORMAL LOW (ref 39.0–52.0)
HCT: 30 % — ABNORMAL LOW (ref 39.0–52.0)
HCT: 28 % — ABNORMAL LOW (ref 39.0–52.0)
HCT: 35 % — ABNORMAL LOW (ref 39.0–52.0)
HCT: 24 % — ABNORMAL LOW (ref 39.0–52.0)
HCT: 34 % — ABNORMAL LOW (ref 39.0–52.0)
Hemoglobin: 11.9 g/dL — ABNORMAL LOW (ref 13.0–17.0)
Hemoglobin: 10.2 g/dL — ABNORMAL LOW (ref 13.0–17.0)
Hemoglobin: 11.9 g/dL — ABNORMAL LOW (ref 13.0–17.0)
Hemoglobin: 9.5 g/dL — ABNORMAL LOW (ref 13.0–17.0)
Hemoglobin: 8.2 g/dL — ABNORMAL LOW (ref 13.0–17.0)
Hemoglobin: 11.6 g/dL — ABNORMAL LOW (ref 13.0–17.0)
Potassium: 3.9 mmol/L (ref 3.5–5.1)
Potassium: 3.7 mmol/L (ref 3.5–5.1)
Potassium: 4.4 mmol/L (ref 3.5–5.1)
Potassium: 4.5 mmol/L (ref 3.5–5.1)
Potassium: 5.1 mmol/L (ref 3.5–5.1)
Potassium: 4.1 mmol/L (ref 3.5–5.1)
Sodium: 136 mmol/L (ref 135–145)
Sodium: 138 mmol/L (ref 135–145)
Sodium: 135 mmol/L (ref 135–145)
Sodium: 136 mmol/L (ref 135–145)
Sodium: 135 mmol/L (ref 135–145)
Sodium: 137 mmol/L (ref 135–145)
TCO2: 24 mmol/L (ref 22–32)
TCO2: 22 mmol/L (ref 22–32)
TCO2: 25 mmol/L (ref 22–32)
TCO2: 26 mmol/L (ref 22–32)
TCO2: 25 mmol/L (ref 22–32)
TCO2: 24 mmol/L (ref 22–32)

## 2020-11-14 LAB — GLUCOSE, CAPILLARY
Glucose-Capillary: 146 mg/dL — ABNORMAL HIGH (ref 70–99)
Glucose-Capillary: 136 mg/dL — ABNORMAL HIGH (ref 70–99)
Glucose-Capillary: 139 mg/dL — ABNORMAL HIGH (ref 70–99)
Glucose-Capillary: 125 mg/dL — ABNORMAL HIGH (ref 70–99)
Glucose-Capillary: 128 mg/dL — ABNORMAL HIGH (ref 70–99)
Glucose-Capillary: 122 mg/dL — ABNORMAL HIGH (ref 70–99)

## 2020-11-14 LAB — CBC
HCT: 39.2 % (ref 39.0–52.0)
HCT: 32.5 % — ABNORMAL LOW (ref 39.0–52.0)
HCT: 33.9 % — ABNORMAL LOW (ref 39.0–52.0)
Hemoglobin: 13.2 g/dL (ref 13.0–17.0)
Hemoglobin: 11.2 g/dL — ABNORMAL LOW (ref 13.0–17.0)
Hemoglobin: 11.4 g/dL — ABNORMAL LOW (ref 13.0–17.0)
MCH: 29.7 pg (ref 26.0–34.0)
MCH: 30.7 pg (ref 26.0–34.0)
MCH: 30.3 pg (ref 26.0–34.0)
MCHC: 33.7 g/dL (ref 30.0–36.0)
MCHC: 34.5 g/dL (ref 30.0–36.0)
MCHC: 33.6 g/dL (ref 30.0–36.0)
MCV: 88.3 fL (ref 80.0–100.0)
MCV: 89 fL (ref 80.0–100.0)
MCV: 90.2 fL (ref 80.0–100.0)
Platelets: 185 10*3/uL (ref 150–400)
Platelets: 115 10*3/uL — ABNORMAL LOW (ref 150–400)
Platelets: 160 10*3/uL (ref 150–400)
RBC: 4.44 MIL/uL (ref 4.22–5.81)
RBC: 3.65 MIL/uL — ABNORMAL LOW (ref 4.22–5.81)
RBC: 3.76 MIL/uL — ABNORMAL LOW (ref 4.22–5.81)
RDW: 12.3 % (ref 11.5–15.5)
RDW: 12.4 % (ref 11.5–15.5)
RDW: 12.6 % (ref 11.5–15.5)
WBC: 8.4 10*3/uL (ref 4.0–10.5)
WBC: 12.5 10*3/uL — ABNORMAL HIGH (ref 4.0–10.5)
WBC: 14.6 10*3/uL — ABNORMAL HIGH (ref 4.0–10.5)
nRBC: 0 % (ref 0.0–0.2)
nRBC: 0 % (ref 0.0–0.2)
nRBC: 0 % (ref 0.0–0.2)

## 2020-11-14 LAB — BASIC METABOLIC PANEL
Anion gap: 9 (ref 5–15)
Anion gap: 4 — ABNORMAL LOW (ref 5–15)
BUN: 11 mg/dL (ref 6–20)
BUN: 14 mg/dL (ref 6–20)
CO2: 24 mmol/L (ref 22–32)
CO2: 21 mmol/L — ABNORMAL LOW (ref 22–32)
Calcium: 8.6 mg/dL — ABNORMAL LOW (ref 8.9–10.3)
Calcium: 7 mg/dL — ABNORMAL LOW (ref 8.9–10.3)
Chloride: 101 mmol/L (ref 98–111)
Chloride: 109 mmol/L (ref 98–111)
Creatinine, Ser: 0.94 mg/dL (ref 0.61–1.24)
Creatinine, Ser: 0.99 mg/dL (ref 0.61–1.24)
GFR, Estimated: >60 mL/min (ref >60)
GFR, Estimated: >60 mL/min (ref >60)
Glucose, Bld: 124 mg/dL — ABNORMAL HIGH (ref 70–99)
Glucose, Bld: 114 mg/dL — ABNORMAL HIGH (ref 70–99)
Potassium: 3.8 mmol/L (ref 3.5–5.1)
Potassium: 4.8 mmol/L (ref 3.5–5.1)
Sodium: 134 mmol/L — ABNORMAL LOW (ref 135–145)
Sodium: 134 mmol/L — ABNORMAL LOW (ref 135–145)

## 2020-11-14 LAB — BLOOD GAS, ARTERIAL
Acid-base deficit: 1.2 mmol/L (ref 0.0–2.0)
Bicarbonate: 22.9 mmol/L (ref 20.0–28.0)
FIO2: 28
O2 Saturation: 97.5 %
Patient temperature: 36.9
pCO2 arterial: 37 mmHg (ref 32.0–48.0)
pH, Arterial: 7.407 (ref 7.350–7.450)
pO2, Arterial: 93.9 mmHg (ref 83.0–108.0)

## 2020-11-14 LAB — HEMOGLOBIN AND HEMATOCRIT, BLOOD
HCT: 26.5 % — ABNORMAL LOW (ref 39.0–52.0)
Hemoglobin: 9.1 g/dL — ABNORMAL LOW (ref 13.0–17.0)

## 2020-11-14 LAB — PROTIME-INR
INR: 1.4 — ABNORMAL HIGH (ref 0.8–1.2)
Prothrombin Time: 17.3 seconds — ABNORMAL HIGH (ref 11.4–15.2)

## 2020-11-14 LAB — PLATELET COUNT: Platelets: 138 10*3/uL — ABNORMAL LOW (ref 150–400)

## 2020-11-14 LAB — MAGNESIUM: Magnesium: 2.9 mg/dL — ABNORMAL HIGH (ref 1.7–2.4)

## 2020-11-14 LAB — APTT: aPTT: 29 seconds (ref 24–36)

## 2020-11-14 SURGERY — CORONARY ARTERY BYPASS GRAFTING (CABG)
Anesthesia: General | Site: Leg Upper | Laterality: Right

## 2020-11-14 MED ORDER — NITROGLYCERIN IN D5W 200-5 MCG/ML-% IV SOLN: 0.0000 ug/min | INTRAVENOUS | Status: DC

## 2020-11-14 MED ORDER — SODIUM CHLORIDE 0.9% FLUSH: 3.0000 mL | INTRAVENOUS | Status: DC | PRN

## 2020-11-14 MED ORDER — ARTIFICIAL TEARS OPHTHALMIC OINT
TOPICAL_OINTMENT | OPHTHALMIC | Status: AC
  Filled 2020-11-14: qty 3.5

## 2020-11-14 MED ORDER — ARTIFICIAL TEARS OPHTHALMIC OINT
TOPICAL_OINTMENT | OPHTHALMIC | Status: DC | PRN
  Administered 2020-11-14: 1 via OPHTHALMIC

## 2020-11-14 MED ORDER — VANCOMYCIN HCL 1000 MG IV SOLR
INTRAVENOUS | Status: DC | PRN
  Administered 2020-11-14: 1000 mL

## 2020-11-14 MED ORDER — ALBUMIN HUMAN 5 % IV SOLN: INTRAVENOUS | Status: DC | PRN

## 2020-11-14 MED ORDER — MIDAZOLAM HCL 5 MG/5ML IJ SOLN
INTRAMUSCULAR | Status: DC | PRN
  Administered 2020-11-14: 4 mg via INTRAVENOUS
  Administered 2020-11-14: 5 mg via INTRAVENOUS
  Administered 2020-11-14: 4 mg via INTRAVENOUS
  Administered 2020-11-14: 2 mg via INTRAVENOUS
  Administered 2020-11-14: 3 mg via INTRAVENOUS
  Administered 2020-11-14: 2 mg via INTRAVENOUS

## 2020-11-14 MED ORDER — PROPOFOL 10 MG/ML IV BOLUS
INTRAVENOUS | Status: DC | PRN
  Administered 2020-11-14: 150 mg via INTRAVENOUS

## 2020-11-14 MED ORDER — MIDAZOLAM HCL (PF) 10 MG/2ML IJ SOLN
INTRAMUSCULAR | Status: AC
  Filled 2020-11-14: qty 2

## 2020-11-14 MED ORDER — METOPROLOL TARTRATE 25 MG/10 ML ORAL SUSPENSION: 12.5000 mg | Freq: Two times a day (BID) | ORAL | Status: DC

## 2020-11-14 MED ORDER — LACTATED RINGERS IV SOLN: 500.0000 mL | Freq: Once | INTRAVENOUS | Status: DC | PRN

## 2020-11-14 MED ORDER — MORPHINE SULFATE (PF) 2 MG/ML IV SOLN
1.0000 mg | INTRAVENOUS | Status: DC | PRN
  Administered 2020-11-14 (×3): 2 mg via INTRAVENOUS
  Administered 2020-11-14 – 2020-11-15 (×3): 4 mg via INTRAVENOUS
  Administered 2020-11-16: 2 mg via INTRAVENOUS
  Administered 2020-11-16: 4 mg via INTRAVENOUS
  Administered 2020-11-16: 2 mg via INTRAVENOUS
  Filled 2020-11-14 (×2): qty 2
  Filled 2020-11-14 (×5): qty 1
  Filled 2020-11-14 (×2): qty 2

## 2020-11-14 MED ORDER — VANCOMYCIN HCL IN DEXTROSE 1-5 GM/200ML-% IV SOLN
1000.0000 mg | Freq: Once | INTRAVENOUS | Status: AC
  Administered 2020-11-14: 1000 mg via INTRAVENOUS
  Filled 2020-11-14: qty 200

## 2020-11-14 MED ORDER — SODIUM CHLORIDE (PF) 0.9 % IJ SOLN
INTRAMUSCULAR | Status: AC
  Filled 2020-11-14: qty 10

## 2020-11-14 MED ORDER — PHENYLEPHRINE 40 MCG/ML (10ML) SYRINGE FOR IV PUSH (FOR BLOOD PRESSURE SUPPORT)
PREFILLED_SYRINGE | INTRAVENOUS | Status: DC | PRN
  Administered 2020-11-14 (×2): 80 ug via INTRAVENOUS

## 2020-11-14 MED ORDER — FENTANYL CITRATE (PF) 250 MCG/5ML IJ SOLN
INTRAMUSCULAR | Status: AC
  Filled 2020-11-14: qty 20

## 2020-11-14 MED ORDER — ASPIRIN 81 MG PO CHEW: 324.0000 mg | CHEWABLE_TABLET | Freq: Every day | ORAL | Status: DC

## 2020-11-14 MED ORDER — ACETAMINOPHEN 500 MG PO TABS
1000.0000 mg | ORAL_TABLET | Freq: Four times a day (QID) | ORAL | Status: AC
  Administered 2020-11-14 – 2020-11-19 (×17): 1000 mg via ORAL
  Filled 2020-11-14 (×15): qty 2

## 2020-11-14 MED ORDER — BISACODYL 10 MG RE SUPP: 10.0000 mg | Freq: Every day | RECTAL | Status: DC

## 2020-11-14 MED ORDER — ROCURONIUM BROMIDE 10 MG/ML (PF) SYRINGE
PREFILLED_SYRINGE | INTRAVENOUS | Status: AC
  Filled 2020-11-14: qty 10

## 2020-11-14 MED ORDER — PHENYLEPHRINE HCL-NACL 10-0.9 MG/250ML-% IV SOLN: INTRAVENOUS | Status: DC | PRN

## 2020-11-14 MED ORDER — SODIUM CHLORIDE 0.9% FLUSH: 10.0000 mL | INTRAVENOUS | Status: DC | PRN

## 2020-11-14 MED ORDER — SODIUM CHLORIDE 0.9 % IV SOLN: INTRAVENOUS | Status: DC

## 2020-11-14 MED ORDER — LACTATED RINGERS IV SOLN: INTRAVENOUS | Status: DC | PRN

## 2020-11-14 MED ORDER — METOPROLOL TARTRATE 5 MG/5ML IV SOLN
2.5000 mg | INTRAVENOUS | Status: DC | PRN
  Administered 2020-11-18: 5 mg via INTRAVENOUS
  Administered 2020-11-19: 2.5 mg via INTRAVENOUS
  Filled 2020-11-14 (×3): qty 5

## 2020-11-14 MED ORDER — PLASMA-LYTE 148 IV SOLN
INTRAVENOUS | Status: DC | PRN
  Administered 2020-11-14: 500 mL

## 2020-11-14 MED ORDER — ONDANSETRON HCL 4 MG/2ML IJ SOLN
INTRAMUSCULAR | Status: AC
  Filled 2020-11-14: qty 2

## 2020-11-14 MED ORDER — ACETAMINOPHEN 160 MG/5ML PO SOLN: 1000.0000 mg | Freq: Four times a day (QID) | ORAL | Status: DC

## 2020-11-14 MED ORDER — METOCLOPRAMIDE HCL 5 MG/ML IJ SOLN
10.0000 mg | Freq: Four times a day (QID) | INTRAMUSCULAR | Status: AC
  Administered 2020-11-14 – 2020-11-15 (×4): 10 mg via INTRAVENOUS
  Filled 2020-11-14 (×4): qty 2

## 2020-11-14 MED ORDER — INSULIN REGULAR(HUMAN) IN NACL 100-0.9 UT/100ML-% IV SOLN: INTRAVENOUS | Status: DC

## 2020-11-14 MED ORDER — HEPARIN SODIUM (PORCINE) 1000 UNIT/ML IJ SOLN
INTRAMUSCULAR | Status: DC | PRN
  Administered 2020-11-14: 31000 [IU] via INTRAVENOUS
  Administered 2020-11-14: 3000 [IU] via INTRAVENOUS

## 2020-11-14 MED ORDER — ONDANSETRON HCL 4 MG/2ML IJ SOLN
4.0000 mg | Freq: Four times a day (QID) | INTRAMUSCULAR | Status: DC | PRN
  Administered 2020-11-14: 4 mg via INTRAVENOUS
  Filled 2020-11-14: qty 2

## 2020-11-14 MED ORDER — ROSUVASTATIN CALCIUM 20 MG PO TABS
40.0000 mg | ORAL_TABLET | Freq: Every day | ORAL | Status: DC
  Administered 2020-11-18 – 2020-11-22 (×5): 40 mg via ORAL
  Filled 2020-11-14 (×5): qty 2

## 2020-11-14 MED ORDER — ROCURONIUM BROMIDE 10 MG/ML (PF) SYRINGE
PREFILLED_SYRINGE | INTRAVENOUS | Status: DC | PRN
  Administered 2020-11-14: 60 mg via INTRAVENOUS
  Administered 2020-11-14: 50 mg via INTRAVENOUS
  Administered 2020-11-14: 40 mg via INTRAVENOUS
  Administered 2020-11-14: 20 mg via INTRAVENOUS
  Administered 2020-11-14: 40 mg via INTRAVENOUS
  Administered 2020-11-14: 60 mg via INTRAVENOUS

## 2020-11-14 MED ORDER — OXYCODONE HCL 5 MG PO TABS
5.0000 mg | ORAL_TABLET | ORAL | Status: DC | PRN
  Administered 2020-11-14: 5 mg via ORAL
  Administered 2020-11-14: 10 mg via ORAL
  Administered 2020-11-15: 5 mg via ORAL
  Administered 2020-11-15 (×3): 10 mg via ORAL
  Administered 2020-11-16: 5 mg via ORAL
  Administered 2020-11-16 (×4): 10 mg via ORAL
  Administered 2020-11-16: 5 mg via ORAL
  Administered 2020-11-17 – 2020-11-21 (×12): 10 mg via ORAL
  Filled 2020-11-14 (×8): qty 2
  Filled 2020-11-14: qty 1
  Filled 2020-11-14 (×2): qty 2
  Filled 2020-11-14: qty 1
  Filled 2020-11-14 (×14): qty 2

## 2020-11-14 MED ORDER — ROCURONIUM BROMIDE 10 MG/ML (PF) SYRINGE
PREFILLED_SYRINGE | INTRAVENOUS | Status: AC
  Filled 2020-11-14: qty 20

## 2020-11-14 MED ORDER — LIDOCAINE 2% (20 MG/ML) 5 ML SYRINGE
INTRAMUSCULAR | Status: AC
  Filled 2020-11-14: qty 5

## 2020-11-14 MED ORDER — CHLORHEXIDINE GLUCONATE 0.12 % MT SOLN
15.0000 mL | OROMUCOSAL | Status: AC
  Administered 2020-11-14: 15 mL via OROMUCOSAL

## 2020-11-14 MED ORDER — MIDAZOLAM HCL (PF) 5 MG/ML IJ SOLN: INTRAMUSCULAR | Status: DC | PRN

## 2020-11-14 MED ORDER — SODIUM CHLORIDE 0.9 % IV SOLN: INTRAVENOUS | Status: DC | PRN

## 2020-11-14 MED ORDER — ALBUMIN HUMAN 5 % IV SOLN
250.0000 mL | INTRAVENOUS | Status: AC | PRN
  Administered 2020-11-14 (×2): 12.5 g via INTRAVENOUS

## 2020-11-14 MED ORDER — PROTAMINE SULFATE 10 MG/ML IV SOLN
INTRAVENOUS | Status: AC
  Filled 2020-11-14: qty 10

## 2020-11-14 MED ORDER — DEXTROSE 50 % IV SOLN: 0.0000 mL | INTRAVENOUS | Status: DC | PRN

## 2020-11-14 MED ORDER — CEFAZOLIN SODIUM-DEXTROSE 2-4 GM/100ML-% IV SOLN
2.0000 g | Freq: Three times a day (TID) | INTRAVENOUS | Status: AC
  Administered 2020-11-14 – 2020-11-16 (×6): 2 g via INTRAVENOUS
  Filled 2020-11-14 (×7): qty 100

## 2020-11-14 MED ORDER — SODIUM CHLORIDE 0.9% FLUSH
3.0000 mL | Freq: Two times a day (BID) | INTRAVENOUS | Status: DC
  Administered 2020-11-15 – 2020-11-21 (×11): 3 mL via INTRAVENOUS

## 2020-11-14 MED ORDER — SODIUM CHLORIDE 0.9 % IV SOLN: 250.0000 mL | INTRAVENOUS | Status: DC

## 2020-11-14 MED ORDER — DEXAMETHASONE SODIUM PHOSPHATE 10 MG/ML IJ SOLN
INTRAMUSCULAR | Status: AC
  Filled 2020-11-14: qty 1

## 2020-11-14 MED ORDER — FENTANYL CITRATE (PF) 250 MCG/5ML IJ SOLN
INTRAMUSCULAR | Status: DC | PRN
  Administered 2020-11-14 (×6): 100 ug via INTRAVENOUS
  Administered 2020-11-14: 150 ug via INTRAVENOUS
  Administered 2020-11-14: 250 ug via INTRAVENOUS

## 2020-11-14 MED ORDER — METOPROLOL TARTRATE 12.5 MG HALF TABLET
12.5000 mg | ORAL_TABLET | Freq: Two times a day (BID) | ORAL | Status: DC
  Administered 2020-11-15: 12.5 mg via ORAL
  Filled 2020-11-14 (×2): qty 1

## 2020-11-14 MED ORDER — ACETAMINOPHEN 160 MG/5ML PO SOLN: 650.0000 mg | Freq: Once | ORAL | Status: AC

## 2020-11-14 MED ORDER — PHENYLEPHRINE HCL-NACL 20-0.9 MG/250ML-% IV SOLN
0.0000 ug/min | INTRAVENOUS | Status: DC
  Administered 2020-11-15: 65 ug/min via INTRAVENOUS
  Administered 2020-11-15: 75 ug/min via INTRAVENOUS
  Filled 2020-11-14: qty 250

## 2020-11-14 MED ORDER — ORAL CARE MOUTH RINSE
15.0000 mL | Freq: Two times a day (BID) | OROMUCOSAL | Status: DC
  Administered 2020-11-14 – 2020-11-15 (×2): 15 mL via OROMUCOSAL

## 2020-11-14 MED ORDER — 0.9 % SODIUM CHLORIDE (POUR BTL) OPTIME
TOPICAL | Status: DC | PRN
  Administered 2020-11-14: 4000 mL

## 2020-11-14 MED ORDER — TRAMADOL HCL 50 MG PO TABS
50.0000 mg | ORAL_TABLET | ORAL | Status: DC | PRN
  Administered 2020-11-16 – 2020-11-21 (×9): 100 mg via ORAL
  Filled 2020-11-14 (×10): qty 2

## 2020-11-14 MED ORDER — SODIUM CHLORIDE 0.45 % IV SOLN: INTRAVENOUS | Status: DC | PRN

## 2020-11-14 MED ORDER — SODIUM CHLORIDE 0.9% FLUSH
10.0000 mL | Freq: Two times a day (BID) | INTRAVENOUS | Status: DC
  Administered 2020-11-15 – 2020-11-21 (×7): 10 mL

## 2020-11-14 MED ORDER — ORAL CARE MOUTH RINSE
15.0000 mL | OROMUCOSAL | Status: DC
  Administered 2020-11-14: 15 mL via OROMUCOSAL

## 2020-11-14 MED ORDER — DEXMEDETOMIDINE HCL IN NACL 400 MCG/100ML IV SOLN: 0.0000 ug/kg/h | INTRAVENOUS | Status: DC

## 2020-11-14 MED ORDER — CHLORHEXIDINE GLUCONATE 0.12% ORAL RINSE (MEDLINE KIT)
15.0000 mL | Freq: Two times a day (BID) | OROMUCOSAL | Status: DC
  Administered 2020-11-14 – 2020-11-15 (×2): 15 mL via OROMUCOSAL

## 2020-11-14 MED ORDER — PROTAMINE SULFATE 10 MG/ML IV SOLN
INTRAVENOUS | Status: DC | PRN
  Administered 2020-11-14: 340 mg via INTRAVENOUS

## 2020-11-14 MED ORDER — PROPOFOL 10 MG/ML IV BOLUS
INTRAVENOUS | Status: AC
  Filled 2020-11-14: qty 20

## 2020-11-14 MED ORDER — DOCUSATE SODIUM 100 MG PO CAPS
200.0000 mg | ORAL_CAPSULE | Freq: Every day | ORAL | Status: DC
  Administered 2020-11-15 – 2020-11-21 (×6): 200 mg via ORAL
  Filled 2020-11-14 (×7): qty 2

## 2020-11-14 MED ORDER — LACTATED RINGERS IV SOLN: INTRAVENOUS | Status: DC

## 2020-11-14 MED ORDER — ACETAMINOPHEN 650 MG RE SUPP
650.0000 mg | Freq: Once | RECTAL | Status: AC
  Administered 2020-11-14: 650 mg via RECTAL

## 2020-11-14 MED ORDER — PANTOPRAZOLE SODIUM 40 MG PO TBEC
40.0000 mg | DELAYED_RELEASE_TABLET | Freq: Every day | ORAL | Status: DC
  Administered 2020-11-16 – 2020-11-22 (×7): 40 mg via ORAL
  Filled 2020-11-14 (×7): qty 1

## 2020-11-14 MED ORDER — SODIUM CHLORIDE (PF) 0.9 % IJ SOLN
OROMUCOSAL | Status: DC | PRN
  Administered 2020-11-14: 24 mL via TOPICAL

## 2020-11-14 MED ORDER — MAGNESIUM SULFATE 4 GM/100ML IV SOLN
4.0000 g | Freq: Once | INTRAVENOUS | Status: AC
  Administered 2020-11-14: 4 g via INTRAVENOUS
  Filled 2020-11-14: qty 100

## 2020-11-14 MED ORDER — BISACODYL 5 MG PO TBEC
10.0000 mg | DELAYED_RELEASE_TABLET | Freq: Every day | ORAL | Status: DC
  Administered 2020-11-15 – 2020-11-21 (×4): 10 mg via ORAL
  Filled 2020-11-14 (×6): qty 2

## 2020-11-14 MED ORDER — LIDOCAINE 2% (20 MG/ML) 5 ML SYRINGE
INTRAMUSCULAR | Status: DC | PRN
  Administered 2020-11-14: 80 mg via INTRAVENOUS

## 2020-11-14 MED ORDER — FAMOTIDINE IN NACL 20-0.9 MG/50ML-% IV SOLN
20.0000 mg | Freq: Two times a day (BID) | INTRAVENOUS | Status: AC
  Administered 2020-11-14 (×2): 20 mg via INTRAVENOUS
  Filled 2020-11-14 (×3): qty 50

## 2020-11-14 MED ORDER — POTASSIUM CHLORIDE 10 MEQ/50ML IV SOLN: 10.0000 meq | INTRAVENOUS | Status: AC

## 2020-11-14 MED ORDER — ASPIRIN EC 325 MG PO TBEC: 325.0000 mg | DELAYED_RELEASE_TABLET | Freq: Every day | ORAL | Status: DC

## 2020-11-14 MED ORDER — MIDAZOLAM HCL 2 MG/2ML IJ SOLN: 2.0000 mg | INTRAMUSCULAR | Status: DC | PRN

## 2020-11-14 SURGICAL SUPPLY — 106 items
BAG DECANTER FOR FLEXI CONT (MISCELLANEOUS) ×5 IMPLANT
BLADE STERNUM SYSTEM 6 (BLADE) ×5 IMPLANT
BLADE SURG 11 STRL SS (BLADE) ×5 IMPLANT
BLADE SURG 15 STRL LF DISP TIS (BLADE) ×4 IMPLANT
BLADE SURG 15 STRL SS (BLADE) ×3
BNDG ELASTIC 4X5.8 VLCR STR LF (GAUZE/BANDAGES/DRESSINGS) ×10 IMPLANT
BNDG ELASTIC 6X5.8 VLCR STR LF (GAUZE/BANDAGES/DRESSINGS) ×5 IMPLANT
BNDG GAUZE ELAST 4 BULKY (GAUZE/BANDAGES/DRESSINGS) ×10 IMPLANT
CABLE PACING FASLOC BIEGE (MISCELLANEOUS) ×5 IMPLANT
CABLE PACING FASLOC BLUE (MISCELLANEOUS) ×5 IMPLANT
CANISTER SUCT 3000ML PPV (MISCELLANEOUS) ×5 IMPLANT
CANNULA EZ GLIDE AORTIC 21FR (CANNULA) ×5 IMPLANT
CANNULA GUNDRY RCSP 15FR (MISCELLANEOUS) ×5 IMPLANT
CATH CPB KIT OWEN (MISCELLANEOUS) ×5 IMPLANT
CATH THORACIC 36FR (CATHETERS) ×5 IMPLANT
CLIP FOGARTY SPRING 6M (CLIP) ×5 IMPLANT
CONN ST 1/4X3/8  BEN (MISCELLANEOUS) ×6
CONN ST 1/4X3/8 BEN (MISCELLANEOUS) ×8 IMPLANT
CONTAINER PROTECT SURGISLUSH (MISCELLANEOUS) ×5 IMPLANT
COVER MAYO STAND STRL (DRAPES) ×5 IMPLANT
DERMABOND ADVANCED (GAUZE/BANDAGES/DRESSINGS) ×2
DERMABOND ADVANCED .7 DNX12 (GAUZE/BANDAGES/DRESSINGS) ×8 IMPLANT
DRAIN CHANNEL 32F RND 10.7 FF (WOUND CARE) ×15 IMPLANT
DRAPE CARDIOVASCULAR INCISE (DRAPES) ×3
DRAPE EXTREMITY T 121X128X90 (DISPOSABLE) ×5 IMPLANT
DRAPE HALF SHEET 40X57 (DRAPES) ×5 IMPLANT
DRAPE INCISE IOBAN 66X45 STRL (DRAPES) ×5 IMPLANT
DRAPE SRG 135X102X78XABS (DRAPES) ×4 IMPLANT
DRAPE WARM FLUID 44X44 (DRAPES) ×5 IMPLANT
DRSG AQUACEL AG ADV 3.5X14 (GAUZE/BANDAGES/DRESSINGS) ×5 IMPLANT
ELECT BLADE 4.0 EZ CLEAN MEGAD (MISCELLANEOUS) ×5
ELECT REM PT RETURN 9FT ADLT (ELECTROSURGICAL) ×10
ELECTRODE BLDE 4.0 EZ CLN MEGD (MISCELLANEOUS) ×4 IMPLANT
ELECTRODE REM PT RTRN 9FT ADLT (ELECTROSURGICAL) ×8 IMPLANT
FELT TEFLON 1X6 (MISCELLANEOUS) ×5 IMPLANT
FIBERTAPE STERNAL CLSR 2 36IN (SUTURE) ×20 IMPLANT
FIBERTAPE STERNAL CLSR 2X36 (SUTURE) ×20 IMPLANT
GAUZE SPONGE 4X4 12PLY STRL (GAUZE/BANDAGES/DRESSINGS) ×5 IMPLANT
GAUZE SPONGE 4X4 12PLY STRL LF (GAUZE/BANDAGES/DRESSINGS) ×10 IMPLANT
GLOVE ORTHO TXT STRL SZ7.5 (GLOVE) ×10 IMPLANT
GOWN STRL REUS W/ TWL LRG LVL3 (GOWN DISPOSABLE) ×16 IMPLANT
GOWN STRL REUS W/TWL LRG LVL3 (GOWN DISPOSABLE) ×16
HEMOSTAT POWDER SURGIFOAM 1G (HEMOSTASIS) ×15 IMPLANT
INSERT FOGARTY XLG (MISCELLANEOUS) ×5 IMPLANT
KIT BASIN OR (CUSTOM PROCEDURE TRAY) ×5 IMPLANT
KIT SUCTION CATH 14FR (SUCTIONS) ×15 IMPLANT
KIT TURNOVER KIT B (KITS) ×5 IMPLANT
KIT VASOVIEW HEMOPRO 2 VH 4000 (KITS) ×5 IMPLANT
LEAD PACING MYOCARDI (MISCELLANEOUS) ×5 IMPLANT
MARKER GRAFT CORONARY BYPASS (MISCELLANEOUS) ×15 IMPLANT
NDL SUT PASSING CERCLAGE MED (SUTURE) ×15
NEEDLE SUT PASSING CERCLAG MED (SUTURE) ×12 IMPLANT
NS IRRIG 1000ML POUR BTL (IV SOLUTION) ×25 IMPLANT
PACK E OPEN HEART (SUTURE) ×5 IMPLANT
PACK OPEN HEART (CUSTOM PROCEDURE TRAY) ×5 IMPLANT
PAD ARMBOARD 7.5X6 YLW CONV (MISCELLANEOUS) ×10 IMPLANT
PAD ELECT DEFIB RADIOL ZOLL (MISCELLANEOUS) ×5 IMPLANT
PENCIL BUTTON HOLSTER BLD 10FT (ELECTRODE) ×5 IMPLANT
POSITIONER HEAD DONUT 9IN (MISCELLANEOUS) ×5 IMPLANT
PUNCH AORTIC ROTATE 4.0MM (MISCELLANEOUS) ×5 IMPLANT
SET CARDIOPLEGIA MPS 5001102 (MISCELLANEOUS) ×5 IMPLANT
SHEARS HARMONIC 9CM CVD (BLADE) ×5 IMPLANT
SOL ANTI FOG 6CC (MISCELLANEOUS) ×4 IMPLANT
SOLUTION ANTI FOG 6CC (MISCELLANEOUS) ×1
SPONGE LAP 18X18 RF (DISPOSABLE) ×15 IMPLANT
SPONGE LAP 4X18 RFD (DISPOSABLE) ×10 IMPLANT
SUPPORT HEART JANKE-BARRON (MISCELLANEOUS) ×10 IMPLANT
SUT BONE WAX W31G (SUTURE) ×5 IMPLANT
SUT ETHIBOND X763 2 0 SH 1 (SUTURE) ×10 IMPLANT
SUT ETHILON 3 0 PS 1 (SUTURE) ×5 IMPLANT
SUT MNCRL AB 3-0 PS2 18 (SUTURE) ×10 IMPLANT
SUT MNCRL AB 4-0 PS2 18 (SUTURE) IMPLANT
SUT PDS AB 1 CTX 36 (SUTURE) ×10 IMPLANT
SUT PROLENE 2 0 SH DA (SUTURE) IMPLANT
SUT PROLENE 3 0 SH DA (SUTURE) ×5 IMPLANT
SUT PROLENE 3 0 SH1 36 (SUTURE) IMPLANT
SUT PROLENE 4 0 RB 1 (SUTURE)
SUT PROLENE 4 0 SH DA (SUTURE) ×5 IMPLANT
SUT PROLENE 4-0 RB1 .5 CRCL 36 (SUTURE) IMPLANT
SUT PROLENE 5 0 C 1 36 (SUTURE) IMPLANT
SUT PROLENE 6 0 C 1 30 (SUTURE) ×30 IMPLANT
SUT PROLENE 7.0 RB 3 (SUTURE) ×15 IMPLANT
SUT PROLENE 8 0 BV175 6 (SUTURE) ×25 IMPLANT
SUT PROLENE BLUE 7 0 (SUTURE) ×5 IMPLANT
SUT PROLENE POLY MONO (SUTURE) ×5 IMPLANT
SUT SILK  1 MH (SUTURE) ×9
SUT SILK 1 MH (SUTURE) ×12 IMPLANT
SUT SILK 2 0 SH (SUTURE) ×5 IMPLANT
SUT VIC AB 1 CTX 36 (SUTURE)
SUT VIC AB 1 CTX36XBRD ANBCTR (SUTURE) IMPLANT
SUT VIC AB 2-0 CT1 27 (SUTURE)
SUT VIC AB 2-0 CT1 TAPERPNT 27 (SUTURE) IMPLANT
SUT VIC AB 2-0 CTX 27 (SUTURE) IMPLANT
SUT VIC AB 3-0 SH 27 (SUTURE)
SUT VIC AB 3-0 SH 27X BRD (SUTURE) IMPLANT
SUT VIC AB 3-0 X1 27 (SUTURE) IMPLANT
SUT VICRYL 4-0 PS2 18IN ABS (SUTURE) IMPLANT
SYSTEM SAHARA CHEST DRAIN ATS (WOUND CARE) ×10 IMPLANT
TAPE CLOTH SURG 4X10 WHT LF (GAUZE/BANDAGES/DRESSINGS) ×5 IMPLANT
TAPE PAPER 2X10 WHT MICROPORE (GAUZE/BANDAGES/DRESSINGS) ×5 IMPLANT
TOWEL GREEN STERILE (TOWEL DISPOSABLE) ×5 IMPLANT
TOWEL GREEN STERILE FF (TOWEL DISPOSABLE) ×5 IMPLANT
TRAY FOLEY SLVR 16FR TEMP STAT (SET/KITS/TRAYS/PACK) ×5 IMPLANT
TUBING LAP HI FLOW INSUFFLATIO (TUBING) ×5 IMPLANT
UNDERPAD 30X36 HEAVY ABSORB (UNDERPADS AND DIAPERS) ×5 IMPLANT
WATER STERILE IRR 1000ML POUR (IV SOLUTION) ×10 IMPLANT

## 2020-11-14 NOTE — Progress Notes (Signed)
EVENING ROUNDS NOTE :     [address]       [place] [address]             [phone]                 Day of Surgery Procedure(s) (LRB): CORONARY ARTERY BYPASS GRAFTING (CABG), ON PUMP, TIMES FOUR, USING BILATERAL INTERNAL MAMMARY ARTERIES, LEFT RADIAL ARTERY HARVESTED OPEN, AND RIGHT ENDOSCOPICALLY HARVESTED GREATER SAPHENOUS VEIN (N/A) TRANSESOPHAGEAL ECHOCARDIOGRAM (TEE) (N/A) RADIAL ARTERY HARVEST (Left) ENDOVEIN HARVEST OF GREATER SAPHENOUS VEIN (Right)   Total Length of Stay:  LOS: 1 day  Events:   Weaning from vent Minimal CT output    BP 95/66   Pulse 86   Temp 99.86 F (37.7 C)   Resp 18   Ht 6' (1.829 m)   Wt 97.7 kg   SpO2 100%   BMI 29.20 kg/m   PAP: (20-35)/(12-21) 25/20 CO:  [4.3 L/min] 4.3 L/min CI:  [1.9 L/min/m2-2 L/min/m2] 2 L/min/m2  Vent Mode: SIMV;PRVC;PSV FiO2 (%):  [40 %-50 %] 40 % Set Rate:  [4 bmp-12 bmp] 4 bmp Vt Set:  [620 mL] 620 mL PEEP:  [5 cmH20] 5 cmH20 Pressure Support:  [10 cmH20] 10 cmH20 Plateau Pressure:  [23 cmH20] 23 cmH20  . sodium chloride 20 mL/hr at 11/14/20 1600  . [START ON 11/15/2020] sodium chloride    . sodium chloride 20 mL/hr at 11/14/20 1505  . sodium chloride 100 mL/hr at 11/14/20 1600  . albumin human 12.5 g (11/14/20 1530)  .  ceFAZolin (ANCEF) IV    . dexmedetomidine (PRECEDEX) IV infusion 0.6 mcg/kg/hr (11/14/20 1600)  . famotidine (PEPCID) IV 20 mg (11/14/20 1607)  . insulin 1.6 mL/hr at 11/14/20 1600  . lactated ringers    . lactated ringers Stopped (11/14/20 1702)  . lactated ringers Stopped (11/14/20 1702)  . magnesium sulfate 20 mL/hr at 11/14/20 1600  . nitroGLYCERIN 5 mcg/min (11/14/20 1600)  . phenylephrine (NEO-SYNEPHRINE) Adult infusion Stopped (11/14/20 1524)  . potassium chloride    . vancomycin      I/O last 3 completed shifts: In: 633.7 [I.V.:633.7] Out: 2250 [Urine:2250]   CBC Latest Ref Rng & Units 11/14/2020 11/14/2020 11/14/2020  WBC 4.0 - 10.5 K/uL 12.5(H) - -   Hemoglobin 13.0 - 17.0 g/dL 11.2(L) 11.6(L) 9.2(L)  Hematocrit 39.0 - 52.0 % 32.5(L) 34.0(L) 27.0(L)  Platelets 150 - 400 K/uL 115(L) - -    BMP Latest Ref Rng & Units 11/14/2020 11/14/2020 11/14/2020  Glucose 70 - 99 mg/dL 935(T) - -  BUN 6 - 20 mg/dL 11 - -  Creatinine 0.17 - 1.24 mg/dL 7.93(J) - -  Sodium 030 - 145 mmol/L 137 137 134(L)  Potassium 3.5 - 5.1 mmol/L 4.1 4.1 4.9  Chloride 98 - 111 mmol/L 102 - -  CO2 22 - 32 mmol/L - - -  Calcium 8.9 - 10.3 mg/dL - - -    ABG    Component Value Date/Time   PHART 7.305 (L) 11/14/2020 1347   PCO2ART 48.2 (H) 11/14/2020 1347   PO2ART 146 (H) 11/14/2020 1347   HCO3 24.0 11/14/2020 1347   TCO2 24 11/14/2020 1351   ACIDBASEDEF 2.0 11/14/2020 1347   O2SAT 99.0 11/14/2020 1347       Brynda Greathouse, MD 11/14/2020 5:46 PM

## 2020-11-14 NOTE — Anesthesia Procedure Notes (Signed)
Arterial Line Insertion Start/End5/11/2020 6:55 AM, 11/14/2020 7:10 AM Performed by: Atilano Median, DO, Jodell Cipro, CRNA, CRNA  Preanesthetic checklist: patient identified, IV checked, site marked, risks and benefits discussed, surgical consent, monitors and equipment checked, pre-op evaluation, timeout performed and anesthesia consent Lidocaine 1% used for infiltration and patient sedated Right, radial was placed Catheter size: 20 G Hand hygiene performed  and maximum sterile barriers used   Attempts: 2 Procedure performed without using ultrasound guided technique. Following insertion, dressing applied and Biopatch. Post procedure assessment: normal  Patient tolerated the procedure well with no immediate complications.

## 2020-11-14 NOTE — Brief Op Note (Signed)
11/12/2020 - 11/14/2020  12:32 PM  PATIENT:  Gerald Andrade  50 y.o. male  PRE-OPERATIVE DIAGNOSIS:  Coronary Artery Disease  POST-OPERATIVE DIAGNOSIS:  Coronary Artery Disease  PROCEDURE:  Procedure(s):  CORONARY ARTERY BYPASS GRAFTING x 4 -Free RIMA to PDA -Left Radial Artery to OM 2 -SVG to DIAGONAL -LIMA to LAD  ENDOSCOPIC HARVEST GREATER SAPHENOUS VEIN -Right thigh to below the knee  OPEN LEFT RADIAL ARTERY HARVEST   TRANSESOPHAGEAL ECHOCARDIOGRAM (TEE) (N/A)   SURGEON:  Surgeon(s) and Role:    Purcell Nails, MD - Primary  PHYSICIAN ASSISTANT: Lowella Dandy PA-C  ASSISTANTS: Tanda Rockers RNFA   ANESTHESIA:   general  EBL:  Per perfusion record  BLOOD ADMINISTERED: CELLSAVER  DRAINS: Pleural Chest Tubes, Mediastinal Chest Drains   LOCAL MEDICATIONS USED:  NONE  SPECIMEN:  No Specimen  DISPOSITION OF SPECIMEN:  N/A  COUNTS:  YES  TOURNIQUET:  * No tourniquets in log *  DICTATION: .Dragon Dictation  PLAN OF CARE: Admit to inpatient   PATIENT DISPOSITION:  ICU - intubated and hemodynamically stable.   Delay start of Pharmacological VTE agent (>24hrs) due to surgical blood loss or risk of bleeding: yes

## 2020-11-14 NOTE — Hospital Course (Addendum)
History of Present Illness:  Patient is a 50 year old mildly obese male with no previous history of coronary artery disease but risk factors notable for history of hypertension and a strong family history of coronary artery disease.  He presents with greater than 69-month history of exertional shortness of breath and sudden onset substernal chest pain consistent with unstable angina pectoris.  He presented for evaluation on 5/4 after experiencing chest pain (middle of chest and did not radiate) that began at 1:30 am.  According to patient, he had mowed 4 yards Monday and tried to rest after that.  After the initial chest pain began, he took 2 puffs of Albuterol as he thought the cause might be from pollen, which did not really help. He then took ec asa 325 mg, which again, did not alleviate the chest pain. Finally, he took two 5 mg Valium and asked his wife to call 911 as chest pain continued.  Workup in the ED consisted of:  EKG showed subtle ST elevation in lead V2 with minimal ST depression in lead II and III. His chest pain did improve with Nitroglycerin. Troponin I (high sensitivity) highest has been 20. CT scan of the chest was negative for PE but did show multifocal coronary calcification. Echo showed LVEF 55-60% and no significant valvular disease. He then underwent a cardiac catheterization which showed LVEF 45%, proximal LAD to Mid LAD lesion is 100% stenosed, 1st Diag lesion is 90% stenosed, proximal Circumflex to mid Circumflx lesion is 100% stenosed, and proximal RCA to Mid RCA lesion is 100% stenosed. A cardiothoracic consultation has been requested with Dr. Cornelius Moras who ultimately felt the patient would benefit from coronary artery bypass grafting procedure.  The risks and benefits of the procedure were explained to the patient and he was agreeable to proceed.  Hospital Course:  The patient had no further chest pain prior to surgery.  He was taken to the operating room on 11/14/2020.  He underwent  CABG x 4 utilizing LIMA to LAD, SVG to Diagonal, Radial Artery to OM 2, and Free RIMA to PDA.  He underwent open harvest of left radial artery and endoscopic harvest of greater saphenous vein from his right thigh to below his knee.  He tolerated the procedure without difficulty and was taken to the SICU in stable condition.  The patient was extubated the evening of surgery without difficulty.  During his stay in the SICU the patient was weaned off Neo-synephrine as hemodynamics allowed.  He was started on Imdur for his radial artery graft.  He chest tubes and arterial lines were removed without difficulty.  He had quite a bit of post operative pain and was treated with Toradol with good response.  He was in NSR and transferred to the progressive care unit on 11/16/2020.  He developed Atrial Fibrillation with RVR.  He was treated with IV Amiodarone drip for this.  He converted to NSR.  He was having difficulty with anxiety.  He was started on prn xanax for this.  He is a newly diagnosed diabetic.  His A1c was 7.9 on admission.  Diabetes education/coordinator were consulted.  They recommended starting Metformin at discharge.  However, patient states he has researched this and is unwilling to take this medication.  Repeat follow up with Coordinator it was recommended the patient start Glipizide 10 mg BID.  He remains in NSR.  His pacing wires were removed without difficulty.  He was diuresed for volume overload state.   Unfortunately the patient continued  to experience PAF.  His Imdur was discontinued to allow titration of his Lopressor dose.  He was also initiated on Cardizem CD for additional HR control.  Finally he was started on Eliquis for stroke prophylaxis.  The patient converted to NSR.  His blood pressure was well controlled.  His blood sugars remain controlled with Glipizide.  He is ambulating without difficulty.  His surgical incisions are healing without evidence of infection.  He is medically stable for  discharge home today.

## 2020-11-14 NOTE — Anesthesia Postprocedure Evaluation (Signed)
Anesthesia Post Note  Patient: Maricela Kawahara  Procedure(s) Performed: CORONARY ARTERY BYPASS GRAFTING (CABG), ON PUMP, TIMES FOUR, USING BILATERAL INTERNAL MAMMARY ARTERIES, LEFT RADIAL ARTERY HARVESTED OPEN, AND RIGHT ENDOSCOPICALLY HARVESTED GREATER SAPHENOUS VEIN (N/A Chest) TRANSESOPHAGEAL ECHOCARDIOGRAM (TEE) (N/A ) RADIAL ARTERY HARVEST (Left Arm Lower) ENDOVEIN HARVEST OF GREATER SAPHENOUS VEIN (Right Leg Upper)     Patient location during evaluation: SICU Anesthesia Type: General Level of consciousness: sedated Pain management: pain level controlled Vital Signs Assessment: post-procedure vital signs reviewed and stable Respiratory status: patient remains intubated per anesthesia plan Cardiovascular status: stable Postop Assessment: no apparent nausea or vomiting Anesthetic complications: no   No complications documented.  Last Vitals:  Vitals:   11/14/20 1635 11/14/20 1645  BP:  95/66  Pulse: 84 86  Resp: 20 18  Temp: 37.6 C 37.7 C  SpO2: 100% 100%    Last Pain:  Vitals:   11/14/20 1605  TempSrc: Core  PainSc:                  Jaycub Noorani S

## 2020-11-14 NOTE — Plan of Care (Signed)
  Problem: Clinical Measurements: Goal: Will remain free from infection Outcome: Progressing Goal: Diagnostic test results will improve Outcome: Progressing Goal: Respiratory complications will improve Outcome: Progressing Goal: Cardiovascular complication will be avoided Outcome: Progressing   Problem: Coping: Goal: Level of anxiety will decrease Outcome: Progressing   Problem: Elimination: Goal: Will not experience complications related to urinary retention Outcome: Progressing   Problem: Pain Managment: Goal: General experience of comfort will improve Outcome: Progressing   Problem: Cardiac: Goal: Will achieve and/or maintain hemodynamic stability Outcome: Progressing   Problem: Clinical Measurements: Goal: Postoperative complications will be avoided or minimized Outcome: Progressing   Problem: Respiratory: Goal: Respiratory status will improve Outcome: Progressing   Problem: Skin Integrity: Goal: Wound healing without signs and symptoms of infection Outcome: Progressing Goal: Risk for impaired skin integrity will decrease Outcome: Progressing   Problem: Urinary Elimination: Goal: Ability to achieve and maintain adequate renal perfusion and functioning will improve Outcome: Progressing

## 2020-11-14 NOTE — Anesthesia Procedure Notes (Signed)
Central Venous Catheter Insertion Performed by: Darral Dash, DO, anesthesiologist Start/End5/11/2020 6:55 AM, 11/14/2020 7:10 AM Patient location: Pre-op. Preanesthetic checklist: patient identified, IV checked, site marked, risks and benefits discussed, surgical consent, monitors and equipment checked, pre-op evaluation, timeout performed and anesthesia consent Position: Trendelenburg Lidocaine 1% used for infiltration and patient sedated Hand hygiene performed  and maximum sterile barriers used  Catheter size: 9 Fr Central line was placed.MAC introducer Procedure performed using ultrasound guided technique. Ultrasound Notes:anatomy identified, needle tip was noted to be adjacent to the nerve/plexus identified, no ultrasound evidence of intravascular and/or intraneural injection and image(s) printed for medical record Attempts: 1 Following insertion, line sutured, dressing applied and Biopatch. Post procedure assessment: blood return through all ports, free fluid flow and no air  Patient tolerated the procedure well with no immediate complications.

## 2020-11-14 NOTE — Procedures (Signed)
Extubation Procedure Note  Patient Details:   Name: Gerald Andrade DOB: 01/25/1971 MRN: [id number]   Airway Documentation:    Vent end date: 11/14/20 Vent end time: 1835   Evaluation  O2 sats: stable throughout Complications: No apparent complications Patient did tolerate procedure well. Bilateral Breath Sounds: Clear,Diminished   Yes   RT extubated patient to 4L [place] per MD order/rapid wean protocol with RN at bedside. Positive cuff leak noted. NIF -25 and VC 710. Patient tolerated well. No stridor noted at this time. RT will continue to monitor as needed.   Jaquelyn Bitter 11/14/2020, 6:55 PM

## 2020-11-14 NOTE — Progress Notes (Signed)
      [address]       [place] [address]             [phone]     CARDIOTHORACIC SURGERY PROGRESS NOTE  Subjective: Gerald Andrade has been scheduled for CORONARY ARTERY BYPASS GRAFTING today.   Objective: Vital signs in last 24 hours: Temp:  [98.3 F (36.8 C)-99.4 F (37.4 C)] 98.3 F (36.8 C) (05/04 2011) Pulse Rate:  [87-105] 87 (05/04 2011) Cardiac Rhythm: Normal sinus rhythm (05/04 2019) Resp:  [19-20] 19 (05/04 2011) BP: (109-130)/(67-71) 116/69 (05/04 2011) SpO2:  [93 %-95 %] 95 % (05/04 2011)  Physical Exam: Unchanged from previously   Intake/Output from previous day: 05/04 0701 - 05/05 0700 In: 633.7 [I.V.:633.7] Out: 400 [Urine:400] Intake/Output this shift: No intake/output data recorded.  Lab Results: Recent Labs    11/13/20 0611 11/14/20 0354  WBC 10.7* 8.4  HGB 13.9 13.2  HCT 41.0 39.2  PLT 205 185   BMET:  Recent Labs    11/13/20 0611 11/14/20 0354  NA 134* 134*  K 3.8 3.8  CL 104 101  CO2 23 24  GLUCOSE 157* 124*  BUN 12 11  CREATININE 1.00 0.94  CALCIUM 8.6* 8.6*    CBG (last 3)  No results for input(s): GLUCAP in the last 72 hours. PT/INR:   Recent Labs    11/13/20 0611  LABPROT 13.9  INR 1.1    Assessment/Plan:   The various methods of treatment have been discussed with the patient. After consideration of the risks, benefits and treatment options the patient has consented to the planned procedure.   The patient has been seen and labs reviewed. There are no changes in the patient's condition to prevent proceeding with the planned procedure today.   Purcell Nails, MD 11/14/2020 6:01 AM

## 2020-11-14 NOTE — Op Note (Signed)
CARDIOTHORACIC SURGERY OPERATIVE NOTE  Date of Procedure: 11/14/2020  Preoperative Diagnosis:   Severe 3-vessel Coronary Artery Disease  Unstable Angina Pectoris  Postoperative Diagnosis: Same  Procedure:    Coronary Artery Bypass Grafting x 4   Left Internal Mammary Artery to Distal Left Anterior Descending Coronary Artery  Free Right Internal Mammary Artery to Posterior Descending Coronary Artery  Left Radial Artery to Second Obtuse Marginal Branch of Left Circumflex Coronary Artery  Sapheonous Vein Graft to Diagonal Branch Coronary Artery  Endoscopic Vein Harvest from Right Thigh  Surgeon: Salvatore Decent. Cornelius Moras, MD  Assistant: Lowella Dandy, PA-C  Anesthesia: Erick Blinks, DO  Operative Findings:  Mild left ventricular systolic dysfunction  Good quality left and right internal mammary artery conduits  Good quality left radial artery conduit  Good quality saphenous vein conduit  Good quality target vessels for grafting    BRIEF CLINICAL NOTE AND INDICATIONS FOR SURGERY  Patient is a 50 year old mildly obese male with no previous history of coronary artery disease but risk factors notable for history of hypertension and a strong family history of coronary artery disease.  He presents with greater than 22-month history of exertional shortness of breath and sudden onset substernal chest pain consistent with unstable angina pectoris.  I have personally reviewed the patient's history, admission laboratory data and diagnostic tests including CT scan, transthoracic echocardiogram and diagnostic cardiac catheterization.  CT angiogram of the chest was negative for pulmonary embolus and otherwise remarkable only for the presence of coronary calcification.  Transthoracic echocardiogram reveals normal left ventricular systolic function with no significant wall motion abnormalities and normal functioning heart valves.  Diagnostic cardiac catheterization reveals severe three-vessel  coronary artery disease with mildly reduced left ventricular function.  Patient appears to have reasonably good distal target vessels for grafting.  I agree the patient would best be treated with surgical revascularization.  The patient has been seen in consultation and counseled at length regarding the indications, risks and potential benefits of surgery.  All questions have been answered, and the patient provides full informed consent for the operation as described.    DETAILS OF THE OPERATIVE PROCEDURE  Preparation:  The patient is brought to the operating room on the above mentioned date and central monitoring was established by the anesthesia team including placement of Swan-Ganz catheter and radial arterial line. The patient is placed in the supine position on the operating table.  Intravenous antibiotics are administered. General endotracheal anesthesia is induced uneventfully. A Foley catheter is placed.  Baseline transesophageal echocardiogram was performed.  Findings were notable for mild left ventricular systolic dysfunction.  There were no significant wall motion abnormalities although there was hypokinesis of the inferior wall.  There was trace to mild central mitral regurgitation.  The patient's chest, abdomen, both groins, and both lower extremities are prepared and draped in a sterile manner. A time out procedure is performed.   Surgical Approach and Conduit Harvest:  The left radial artery was harvested for use as a bypass graft through a longitudinal incision along the volar aspect of the left forearm.  All intervening side branches of the radial artery and its associated veins were divided using the harmonic scalpel.  After systemic heparinization the radial artery was transected proximally and distally and both stumps were oversewn using suture ligatures.  The forearm incision was closed using running absorbable suture.  A median sternotomy incision was performed and the left  and right internal mammary arteries are dissected from the chest wall and prepared for  bypass grafting. Both internal mammary arteries are notably good quality conduit. Simultaneously, the greater saphenous vein is obtained from the patient's right thigh using endoscopic vein harvest technique. The saphenous vein is notably good quality conduit. After removal of the saphenous vein, the small surgical incisions in the lower extremity are closed with absorbable suture. Following systemic heparinization, the both internal mammary arteries were transected distally noted to have excellent flow.   Extracorporeal Cardiopulmonary Bypass and Myocardial Protection:  The pericardium is opened. The ascending aorta is normal in appearance. The ascending aorta and the right atrium are cannulated for cardiopulmonary bypass.  Adequate heparinization is verified.   A retrograde cardioplegia cannula is placed through the right atrium into the coronary sinus.  The entire pre-bypass portion of the operation was notable for stable hemodynamics.  Cardiopulmonary bypass was begun and the surface of the heart is inspected. Distal target vessels are selected for coronary artery bypass grafting.  The right internal mammary artery will not reach any of the appropriate target vessels as an in situ graft.  Therefore the vessel is transected proximally close to its takeoff from the subclavian artery and the proximal vessel was oversewn.  A cardioplegia cannula is placed in the ascending aorta.  A temperature probe was placed in the interventricular septum.  The patient is allowed to cool passively to [facility] systemic temperature.  The aortic cross clamp is applied and cold blood cardioplegia is delivered initially in an antegrade fashion through the aortic root.   Supplemental cardioplegia is given retrograde through the coronary sinus catheter.  Iced saline slush is applied for topical hypothermia.  The initial cardioplegic arrest is  rapid with early diastolic arrest.  Repeat doses of cardioplegia are administered intermittently throughout the entire cross clamp portion of the operation through the aortic root,  through the coronary sinus catheter, and through subsequently placed vein graft in order to maintain completely flat electrocardiogram and septal myocardial temperature below 15C.  Myocardial protection was felt to be excellent.   Coronary Artery Bypass Grafting:   The posterior descending branch of the right coronary artery was grafted using the right internal mammary artery as a free graft in an end-to-side fashion.  At the site of distal anastomosis the target vessel was good quality and measured approximately 1.8 mm in diameter.  The second obtuse marginal branch of the left circumflex coronary artery was grafted using the left radial artery in an end-to-side fashion.  At the site of distal anastomosis the target vessel was good quality and measured approximately 2.0 mm in diameter.  The diagonal branch of the left anterior descending coronary artery was grafted using a reversed saphenous vein graft in an end-to-side fashion.  At the site of distal anastomosis the target vessel was good quality and measured approximately 2.0 mm in diameter.  The distal left anterior coronary artery was grafted with the left internal mammary artery in an end-to-side fashion.  At the site of distal anastomosis the target vessel was good quality and measured approximately 2.2 mm in diameter.  The single proximal vein graft anastomosis and the left radial artery proximal anastomosis were both placed directly to the ascending aorta prior to removal of the aortic cross clamp.  Another short piece of greater saphenous vein is so directly to the proximal aorta to be utilized at the proximal end of the free right internal mammary artery graft.  The septal myocardial temperature rose rapidly after reperfusion of the left internal mammary artery  graft.  The aortic  cross clamp was removed after a total cross clamp time of 97 minutes.  After removal of the aortic cross-clamp the proximal end of the free right internal mammary artery graft was sewn to the short proximal segment of saphenous vein in an end-to-end fashion.   Procedure Completion:  All proximal and distal coronary anastomoses were inspected for hemostasis and appropriate graft orientation. Epicardial pacing wires are fixed to the right ventricular outflow tract and to the right atrial appendage. The patient is rewarmed to 37C temperature. The patient is weaned and disconnected from cardiopulmonary bypass.  The patient's rhythm at separation from bypass was sinus.  The patient was weaned from cardiopulmonary bypass without any inotropic support. Total cardiopulmonary bypass time for the operation was 146 minutes.  Followup transesophageal echocardiogram performed after separation from bypass revealed no changes from the preoperative exam.  The aortic and venous cannula were removed uneventfully. Protamine was administered to reverse the anticoagulation. The mediastinum and pleural space were inspected for hemostasis and irrigated with saline solution. The mediastinum and both pleural spaces were drained using 4 chest tubes placed through separate stab incisions inferiorly.  The soft tissues anterior to the aorta were reapproximated loosely. The sternum is closed with Fibertape cerclage. The soft tissues anterior to the sternum were closed in multiple layers and the skin is closed with a running subcuticular skin closure.  The post-bypass portion of the operation was notable for stable rhythm and hemodynamics.  No blood products were administered during the operation.   Disposition:  The patient tolerated the procedure well and is transported to the surgical intensive care in stable condition. There are no intraoperative complications. All sponge instrument and needle counts are  verified correct at completion of the operation.    Salvatore Decent. Cornelius Moras MD 11/14/2020 3:05 PM

## 2020-11-14 NOTE — Anesthesia Procedure Notes (Signed)
Procedure Name: Intubation Date/Time: 11/14/2020 7:52 AM Performed by: Leonor Liv, CRNA Pre-anesthesia Checklist: Patient identified, Emergency Drugs available, Suction available and Patient being monitored Patient Re-evaluated:Patient Re-evaluated prior to induction Oxygen Delivery Method: Circle System Utilized Preoxygenation: Pre-oxygenation with 100% oxygen Induction Type: IV induction Ventilation: Mask ventilation without difficulty and Oral airway inserted - appropriate to patient size Laryngoscope Size: Mac and 4 Grade View: Grade II Tube type: Oral Tube size: 8.0 mm Number of attempts: 1 Airway Equipment and Method: Stylet and Oral airway Placement Confirmation: ETT inserted through vocal cords under direct vision,  positive ETCO2 and breath sounds checked- equal and bilateral Secured at: 23 cm Tube secured with: Tape Dental Injury: Teeth and Oropharynx as per pre-operative assessment

## 2020-11-14 NOTE — Transfer of Care (Signed)
Immediate Anesthesia Transfer of Care Note  Patient: Gerald Andrade  Procedure(s) Performed: CORONARY ARTERY BYPASS GRAFTING (CABG), ON PUMP, TIMES FOUR, USING BILATERAL INTERNAL MAMMARY ARTERIES, LEFT RADIAL ARTERY HARVESTED OPEN, AND RIGHT ENDOSCOPICALLY HARVESTED GREATER SAPHENOUS VEIN (N/A Chest) TRANSESOPHAGEAL ECHOCARDIOGRAM (TEE) (N/A ) RADIAL ARTERY HARVEST (Left Arm Lower) ENDOVEIN HARVEST OF GREATER SAPHENOUS VEIN (Right Leg Upper)  Patient Location: SICU  Anesthesia Type:General  Level of Consciousness: sedated and Patient remains intubated per anesthesia plan  Airway & Oxygen Therapy: Patient remains intubated per anesthesia plan and Patient placed on Ventilator (see vital sign flow sheet for setting)  Post-op Assessment: Report given to RN and Post -op Vital signs reviewed and stable  Post vital signs: Reviewed and stable  Last Vitals:  Vitals Value Taken Time  BP    Temp 37.2 C 11/14/20 1512  Pulse 81 11/14/20 1512  Resp 17 11/14/20 1512  SpO2 100 % 11/14/20 1512  Vitals shown include unvalidated device data.  Last Pain:  Vitals:   11/14/20 0605  TempSrc: Oral  PainSc:       Patients Stated Pain Goal: 2 (11/13/20 1110)  Complications: No complications documented.

## 2020-11-14 NOTE — Anesthesia Procedure Notes (Signed)
Central Venous Catheter Insertion Performed by: Atilano Median, DO, anesthesiologist Start/End5/11/2020 7:11 AM, 11/14/2020 7:12 AM Patient location: Pre-op. Preanesthetic checklist: patient identified, IV checked, site marked, risks and benefits discussed, surgical consent, monitors and equipment checked, pre-op evaluation, timeout performed and anesthesia consent Position: supine Hand hygiene performed  and maximum sterile barriers used  PA cath was placed.Swan type:thermodilution PA Cath depth:50 Procedure performed without using ultrasound guided technique. Attempts: 1 Patient tolerated the procedure well with no immediate complications.

## 2020-11-15 ENCOUNTER — Inpatient Hospital Stay (HOSPITAL_COMMUNITY): Payer: Self-pay

## 2020-11-15 ENCOUNTER — Encounter (HOSPITAL_COMMUNITY): Payer: Self-pay | Admitting: Thoracic Surgery (Cardiothoracic Vascular Surgery)

## 2020-11-15 LAB — BASIC METABOLIC PANEL
Anion gap: 8 (ref 5–15)
Anion gap: 7 (ref 5–15)
BUN: 15 mg/dL (ref 6–20)
BUN: 19 mg/dL (ref 6–20)
CO2: 20 mmol/L — ABNORMAL LOW (ref 22–32)
CO2: 22 mmol/L (ref 22–32)
Calcium: 7.4 mg/dL — ABNORMAL LOW (ref 8.9–10.3)
Calcium: 7.6 mg/dL — ABNORMAL LOW (ref 8.9–10.3)
Chloride: 106 mmol/L (ref 98–111)
Chloride: 103 mmol/L (ref 98–111)
Creatinine, Ser: 0.94 mg/dL (ref 0.61–1.24)
Creatinine, Ser: 1.13 mg/dL (ref 0.61–1.24)
GFR, Estimated: >60 mL/min (ref >60)
GFR, Estimated: >60 mL/min (ref >60)
Glucose, Bld: 98 mg/dL (ref 70–99)
Glucose, Bld: 206 mg/dL — ABNORMAL HIGH (ref 70–99)
Potassium: 4.6 mmol/L (ref 3.5–5.1)
Potassium: 4.6 mmol/L (ref 3.5–5.1)
Sodium: 134 mmol/L — ABNORMAL LOW (ref 135–145)
Sodium: 132 mmol/L — ABNORMAL LOW (ref 135–145)

## 2020-11-15 LAB — ECHO INTRAOPERATIVE TEE
AR max vel: 2.53 cm2
AV Area VTI: 2.55 cm2
AV Area mean vel: 2.48 cm2
AV Mean grad: 3 mmHg
AV Peak grad: 5.3 mmHg
Ao pk vel: 1.15 m/s
Area-P 1/2: 4.49 cm2
Height: 72 in
MV VTI: 3.63 cm2
S' Lateral: 4.1 cm
Weight: 3444.8 oz

## 2020-11-15 LAB — CBC
HCT: 33.6 % — ABNORMAL LOW (ref 39.0–52.0)
HCT: 34.5 % — ABNORMAL LOW (ref 39.0–52.0)
Hemoglobin: 11.4 g/dL — ABNORMAL LOW (ref 13.0–17.0)
Hemoglobin: 11.4 g/dL — ABNORMAL LOW (ref 13.0–17.0)
MCH: 30.3 pg (ref 26.0–34.0)
MCH: 30.3 pg (ref 26.0–34.0)
MCHC: 33.9 g/dL (ref 30.0–36.0)
MCHC: 33 g/dL (ref 30.0–36.0)
MCV: 89.4 fL (ref 80.0–100.0)
MCV: 91.8 fL (ref 80.0–100.0)
Platelets: 183 10*3/uL (ref 150–400)
Platelets: 153 10*3/uL (ref 150–400)
RBC: 3.76 MIL/uL — ABNORMAL LOW (ref 4.22–5.81)
RBC: 3.76 MIL/uL — ABNORMAL LOW (ref 4.22–5.81)
RDW: 13 % (ref 11.5–15.5)
RDW: 13.2 % (ref 11.5–15.5)
WBC: 18.5 10*3/uL — ABNORMAL HIGH (ref 4.0–10.5)
WBC: 16.4 10*3/uL — ABNORMAL HIGH (ref 4.0–10.5)
nRBC: 0 % (ref 0.0–0.2)
nRBC: 0 % (ref 0.0–0.2)

## 2020-11-15 LAB — MAGNESIUM
Magnesium: 2.8 mg/dL — ABNORMAL HIGH (ref 1.7–2.4)
Magnesium: 2.7 mg/dL — ABNORMAL HIGH (ref 1.7–2.4)

## 2020-11-15 LAB — GLUCOSE, CAPILLARY
Glucose-Capillary: 108 mg/dL — ABNORMAL HIGH (ref 70–99)
Glucose-Capillary: 112 mg/dL — ABNORMAL HIGH (ref 70–99)
Glucose-Capillary: 113 mg/dL — ABNORMAL HIGH (ref 70–99)
Glucose-Capillary: 136 mg/dL — ABNORMAL HIGH (ref 70–99)
Glucose-Capillary: 196 mg/dL — ABNORMAL HIGH (ref 70–99)
Glucose-Capillary: 207 mg/dL — ABNORMAL HIGH (ref 70–99)

## 2020-11-15 LAB — LIPOPROTEIN A (LPA): Lipoprotein (a): 9.3 nmol/L (ref <75.0)

## 2020-11-15 MED ORDER — CLOPIDOGREL BISULFATE 75 MG PO TABS
75.0000 mg | ORAL_TABLET | Freq: Every day | ORAL | Status: DC
  Administered 2020-11-16 – 2020-11-19 (×4): 75 mg via ORAL
  Filled 2020-11-15 (×5): qty 1

## 2020-11-15 MED ORDER — INSULIN ASPART 100 UNIT/ML IJ SOLN
0.0000 [IU] | INTRAMUSCULAR | Status: DC
  Administered 2020-11-15: 4 [IU] via SUBCUTANEOUS
  Administered 2020-11-15: 8 [IU] via SUBCUTANEOUS
  Administered 2020-11-16 (×2): 2 [IU] via SUBCUTANEOUS

## 2020-11-15 MED ORDER — INSULIN ASPART 100 UNIT/ML IJ SOLN: 0.0000 [IU] | INTRAMUSCULAR | Status: DC

## 2020-11-15 MED ORDER — ENOXAPARIN SODIUM 40 MG/0.4ML IJ SOSY
40.0000 mg | PREFILLED_SYRINGE | Freq: Every day | INTRAMUSCULAR | Status: DC
  Administered 2020-11-16 – 2020-11-19 (×3): 40 mg via SUBCUTANEOUS
  Filled 2020-11-15 (×4): qty 0.4

## 2020-11-15 MED ORDER — ASPIRIN EC 81 MG PO TBEC
81.0000 mg | DELAYED_RELEASE_TABLET | Freq: Every day | ORAL | Status: DC
  Administered 2020-11-16 – 2020-11-22 (×7): 81 mg via ORAL
  Filled 2020-11-15 (×7): qty 1

## 2020-11-15 MED ORDER — ISOSORBIDE MONONITRATE ER 30 MG PO TB24
15.0000 mg | ORAL_TABLET | Freq: Every day | ORAL | Status: DC
  Administered 2020-11-16 – 2020-11-19 (×4): 15 mg via ORAL
  Filled 2020-11-15 (×5): qty 1

## 2020-11-15 MED ORDER — ASPIRIN EC 325 MG PO TBEC: 325.0000 mg | DELAYED_RELEASE_TABLET | Freq: Every day | ORAL | Status: DC

## 2020-11-15 MED ORDER — KETOROLAC TROMETHAMINE 15 MG/ML IJ SOLN
15.0000 mg | Freq: Four times a day (QID) | INTRAMUSCULAR | Status: AC
  Administered 2020-11-15 – 2020-11-16 (×5): 15 mg via INTRAVENOUS
  Filled 2020-11-15 (×5): qty 1

## 2020-11-15 MED FILL — Heparin Sod (Porcine)-NaCl IV Soln 1000 Unit/500ML-0.9%: INTRAVENOUS | Qty: 500 | Status: AC

## 2020-11-15 NOTE — Progress Notes (Addendum)
      [address]       [place] [address]             [phone]      1 Day Post-Op Procedure(s) (LRB): CORONARY ARTERY BYPASS GRAFTING (CABG), ON PUMP, TIMES FOUR, USING BILATERAL INTERNAL MAMMARY ARTERIES, LEFT RADIAL ARTERY HARVESTED OPEN, AND RIGHT ENDOSCOPICALLY HARVESTED GREATER SAPHENOUS VEIN (N/A) TRANSESOPHAGEAL ECHOCARDIOGRAM (TEE) (N/A) RADIAL ARTERY HARVEST (Left) ENDOVEIN HARVEST OF GREATER SAPHENOUS VEIN (Right)   Subjective:  Patient complaining of pain. Not unexpected with bilateral mammaries. He denies numbness and tingling in his left arm.  Objective: Vital signs in last 24 hours: Temp:  [98.8 F (37.1 C)-101.8 F (38.8 C)] 100.6 F (38.1 C) (05/06 0700) Pulse Rate:  [80-98] 96 (05/06 0700) Cardiac Rhythm: Atrial paced (05/06 0400) Resp:  [10-32] 19 (05/06 0700) BP: (91-121)/(46-76) 118/69 (05/06 0700) SpO2:  [92 %-100 %] 93 % (05/06 0700) Arterial Line BP: (74-124)/(39-65) 108/50 (05/06 0700) FiO2 (%):  [40 %-50 %] 40 % (05/05 1755) Weight:  [103.7 kg] 103.7 kg (05/06 0500)  Hemodynamic parameters for last 24 hours: PAP: (20-46)/(9-23) 30/11 CO:  [4.3 L/min-5.7 L/min] 5 L/min CI:  [1.9 L/min/m2-2.6 L/min/m2] 2.3 L/min/m2  Intake/Output from previous day: 05/05 0701 - 05/06 0700 In: 5282 [I.V.:4043.2; Blood:419; IV Piggyback:819.9] Out: 3198 [Urine:2200; Blood:250; Chest Tube:748]  General appearance: alert, cooperative and no distress Heart: regular rate and rhythm Lungs: clear to auscultation bilaterally Abdomen: soft, non-tender; bowel sounds normal; no masses,  no organomegaly Extremities: edema trace Wound: clean and dry  Lab Results: Recent Labs    11/14/20 2105 11/15/20 0359  WBC 14.6* 18.5*  HGB 11.4* 11.4*  HCT 33.9* 33.6*  PLT 160 183   BMET:  Recent Labs    11/14/20 2105 11/15/20 0359  NA 134* 134*  K 4.8 4.6  CL 109 106  CO2 21* 20*  GLUCOSE 114* 98  BUN 14 15  CREATININE 0.99 0.94  CALCIUM 7.0*  7.4*    PT/INR:  Recent Labs    11/14/20 1523  LABPROT 17.3*  INR 1.4*   ABG    Component Value Date/Time   PHART 7.305 (L) 11/14/2020 1347   HCO3 24.0 11/14/2020 1347   TCO2 24 11/14/2020 1351   ACIDBASEDEF 2.0 11/14/2020 1347   O2SAT 99.0 11/14/2020 1347   CBG (last 3)  Recent Labs    11/15/20 0205 11/15/20 0405 11/15/20 0650  GLUCAP 112* 108* 113*    Assessment/Plan: S/P Procedure(s) (LRB): CORONARY ARTERY BYPASS GRAFTING (CABG), ON PUMP, TIMES FOUR, USING BILATERAL INTERNAL MAMMARY ARTERIES, LEFT RADIAL ARTERY HARVESTED OPEN, AND RIGHT ENDOSCOPICALLY HARVESTED GREATER SAPHENOUS VEIN (N/A) TRANSESOPHAGEAL ECHOCARDIOGRAM (TEE) (N/A) RADIAL ARTERY HARVEST (Left) ENDOVEIN HARVEST OF GREATER SAPHENOUS VEIN (Right)  1. CV- NSR, hypotensive- currently on Neo at 55 mcg, Imdur started for radial artery graft, lopressor ordered 2. Pulm- CT output 748, CXR w/o pneumothorax, + atelectasis, trace effusions 3. Renal- creatinine WNL, weight minimally elevated, no lasix while on Neo 4. Expected post operative blood loss anemia, mild  5. CBGs- controlled, stop insulin drip, start SSIP 6. Dispo- patient stable, Imdur for radial artery graft, weaning Neo as tolerated, pain as expected Toradol added, POD #1 progression orders   LOS: 2 days    Lowella Dandy, PA-C 11/15/2020   I have seen and examined the patient and agree with the assessment and plan as outlined.  Purcell Nails, MD 11/15/2020

## 2020-11-15 NOTE — Progress Notes (Signed)
Received pt alert orient x4, not in distress with O2 via HFNC at 6 LPM, with introducer at right IJ, with closed drainage system to wall suction with serosanguinous output, dressing still dry and intact, with AAI wires set to 80 noted HR 80-85 bpm, with foley catheter to bag with UO noted, pt complained of surgical pain in abdomen, with pain scale=5, no further concern was made

## 2020-11-15 NOTE — Progress Notes (Signed)
Shift summary: removed PAC, arterial line. Mobilized.   N:AAOx4, diaphoretic but afebrile. Complains of 10/10 with movement, managed to 3-4/10 with 10mg  oxycodone prn, 4mg  morphine prn, scheduled ketorolac. Ambulated 1 small lap around unit on 6LNC and satting 88-90%.   RL Weaned from 10L HFNC to [facility] this shift, lungs remain clear/dim. Pt tachypneic and shallow breathing while in pain resulting in desatting. Unlabored and satting 96% at rest.   CV: Weaned off neo gtt and NTG gtt today, BP now WDL. Removed arterial line per order. AAI wires set at 80, pt ST in 90s. Swan removed per order, introducer remains in place. Chest tubes x4 with moderate output, increased output after mobility. Pleural drains with output this shift and mediastinals with [facility] out this shift, both serosanguineous.   GI: advanced diet from clear liquid to heart healthy diet, tolerating PO intake. No BM, hypoactive tones. BG corrected with SS insulin.   GU: foley in place, out this shift.   Skin: No new skin issues, sternal wound with silver dressing and graft sites painted with betadine and covered with gauze/tape.   Wife and mother at bedside this shift.   Srinivas Lippman RN

## 2020-11-15 NOTE — Progress Notes (Signed)
[facility] BRIEF SICU PROGRESS NOTE  1 Day Post-Op  S/P Procedure(s) (LRB): CORONARY ARTERY BYPASS GRAFTING (CABG), ON PUMP, TIMES FOUR, USING BILATERAL INTERNAL MAMMARY ARTERIES, LEFT RADIAL ARTERY HARVESTED OPEN, AND RIGHT ENDOSCOPICALLY HARVESTED GREATER SAPHENOUS VEIN (N/A) TRANSESOPHAGEAL ECHOCARDIOGRAM (TEE) (N/A) RADIAL ARTERY HARVEST (Left) ENDOVEIN HARVEST OF GREATER SAPHENOUS VEIN (Right)   Stable day NSR w/ stable BP off Neo Breathing comfortably on nasal cannula UOP adequate  Plan: Continue current plan  Purcell Nails, MD 11/15/2020 6:48 PM

## 2020-11-16 ENCOUNTER — Inpatient Hospital Stay (HOSPITAL_COMMUNITY): Payer: Self-pay

## 2020-11-16 LAB — GLUCOSE, CAPILLARY
Glucose-Capillary: 140 mg/dL — ABNORMAL HIGH (ref 70–99)
Glucose-Capillary: 142 mg/dL — ABNORMAL HIGH (ref 70–99)
Glucose-Capillary: 143 mg/dL — ABNORMAL HIGH (ref 70–99)
Glucose-Capillary: 150 mg/dL — ABNORMAL HIGH (ref 70–99)
Glucose-Capillary: 153 mg/dL — ABNORMAL HIGH (ref 70–99)
Glucose-Capillary: 175 mg/dL — ABNORMAL HIGH (ref 70–99)

## 2020-11-16 LAB — CBC
HCT: 30.5 % — ABNORMAL LOW (ref 39.0–52.0)
Hemoglobin: 10.1 g/dL — ABNORMAL LOW (ref 13.0–17.0)
MCH: 30.5 pg (ref 26.0–34.0)
MCHC: 33.1 g/dL (ref 30.0–36.0)
MCV: 92.1 fL (ref 80.0–100.0)
Platelets: 131 10*3/uL — ABNORMAL LOW (ref 150–400)
RBC: 3.31 MIL/uL — ABNORMAL LOW (ref 4.22–5.81)
RDW: 13.3 % (ref 11.5–15.5)
WBC: 11.4 10*3/uL — ABNORMAL HIGH (ref 4.0–10.5)
nRBC: 0 % (ref 0.0–0.2)

## 2020-11-16 LAB — BASIC METABOLIC PANEL
Anion gap: 6 (ref 5–15)
BUN: 18 mg/dL (ref 6–20)
CO2: 25 mmol/L (ref 22–32)
Calcium: 7.9 mg/dL — ABNORMAL LOW (ref 8.9–10.3)
Chloride: 101 mmol/L (ref 98–111)
Creatinine, Ser: 0.98 mg/dL (ref 0.61–1.24)
GFR, Estimated: >60 mL/min (ref >60)
Glucose, Bld: 138 mg/dL — ABNORMAL HIGH (ref 70–99)
Potassium: 4.2 mmol/L (ref 3.5–5.1)
Sodium: 132 mmol/L — ABNORMAL LOW (ref 135–145)

## 2020-11-16 MED ORDER — POTASSIUM CHLORIDE CRYS ER 20 MEQ PO TBCR
20.0000 meq | EXTENDED_RELEASE_TABLET | Freq: Two times a day (BID) | ORAL | Status: DC
  Administered 2020-11-16 (×2): 20 meq via ORAL
  Filled 2020-11-16 (×2): qty 1

## 2020-11-16 MED ORDER — ALPRAZOLAM 0.5 MG PO TABS
0.5000 mg | ORAL_TABLET | Freq: Three times a day (TID) | ORAL | Status: DC | PRN
  Administered 2020-11-16 – 2020-11-21 (×7): 0.5 mg via ORAL
  Filled 2020-11-16 (×8): qty 1

## 2020-11-16 MED ORDER — METOPROLOL TARTRATE 25 MG PO TABS
25.0000 mg | ORAL_TABLET | Freq: Two times a day (BID) | ORAL | Status: DC
  Administered 2020-11-16 – 2020-11-17 (×3): 25 mg via ORAL
  Filled 2020-11-16 (×3): qty 1

## 2020-11-16 MED ORDER — FUROSEMIDE 40 MG PO TABS
40.0000 mg | ORAL_TABLET | Freq: Two times a day (BID) | ORAL | Status: AC
  Administered 2020-11-16 – 2020-11-17 (×4): 40 mg via ORAL
  Filled 2020-11-16 (×4): qty 1

## 2020-11-16 MED ORDER — INSULIN ASPART 100 UNIT/ML IJ SOLN
0.0000 [IU] | Freq: Three times a day (TID) | INTRAMUSCULAR | Status: DC
  Administered 2020-11-16: 4 [IU] via SUBCUTANEOUS
  Administered 2020-11-16 (×3): 2 [IU] via SUBCUTANEOUS

## 2020-11-16 MED ORDER — ~~LOC~~ CARDIAC SURGERY, PATIENT & FAMILY EDUCATION: Freq: Once | Status: AC

## 2020-11-16 NOTE — Progress Notes (Addendum)
      [address]       [place] [address]             [phone]        CARDIOTHORACIC SURGERY PROGRESS NOTE   R2 Days Post-Op Procedure(s) (LRB): CORONARY ARTERY BYPASS GRAFTING (CABG), ON PUMP, TIMES FOUR, USING BILATERAL INTERNAL MAMMARY ARTERIES, LEFT RADIAL ARTERY HARVESTED OPEN, AND RIGHT ENDOSCOPICALLY HARVESTED GREATER SAPHENOUS VEIN (N/A) TRANSESOPHAGEAL ECHOCARDIOGRAM (TEE) (N/A) RADIAL ARTERY HARVEST (Left) ENDOVEIN HARVEST OF GREATER SAPHENOUS VEIN (Right)  Subjective: Didn't sleep.  Sore in chest.  No SOB  Objective: Vital signs: BP Readings from Last 1 Encounters:  11/16/20 108/68   Pulse Readings from Last 1 Encounters:  11/16/20 86   Resp Readings from Last 1 Encounters:  11/16/20 19   Temp Readings from Last 1 Encounters:  11/16/20 98.2 F (36.8 C) (Oral)    Hemodynamics: PAP: (27-31)/(9-17) 31/16  Physical Exam:  Rhythm:   sinus  Breath sounds: clear  Heart sounds:  RRR  Incisions:  Dressings dry, intact  Abdomen:  Soft, non-distended, non-tender  Extremities:  Warm, well-perfused  Chest tubes:  low volume thin serosanguinous output, no air leak    Intake/Output from previous day: 05/06 0701 - 05/07 0700 In: 729.9 [P.O.:380; I.V.:149.8; IV Piggyback:200.1] Out: 1715 [Urine:1375; Chest Tube:340] Intake/Output this shift: No intake/output data recorded.  Lab Results:  CBC: Recent Labs    11/15/20 1628 11/16/20 0500  WBC 16.4* 11.4*  HGB 11.4* 10.1*  HCT 34.5* 30.5*  PLT 153 131*    BMET:  Recent Labs    11/15/20 1628 11/16/20 0500  NA 132* 132*  K 4.6 4.2  CL 103 101  CO2 22 25  GLUCOSE 206* 138*  BUN 19 18  CREATININE 1.13 0.98  CALCIUM 7.6* 7.9*     PT/INR:   Recent Labs    11/14/20 1523  LABPROT 17.3*  INR 1.4*    CBG (last 3)  Recent Labs    11/16/20 0003 11/16/20 0433 11/16/20 0656  GLUCAP 142* 140* 143*    ABG    Component Value Date/Time   PHART 7.305 (L) 11/14/2020 1347    PCO2ART 48.2 (H) 11/14/2020 1347   PO2ART 146 (H) 11/14/2020 1347   HCO3 24.0 11/14/2020 1347   TCO2 24 11/14/2020 1351   ACIDBASEDEF 2.0 11/14/2020 1347   O2SAT 99.0 11/14/2020 1347    CXR: Bibasilar atelectasis, otherwise clear  Assessment/Plan: S/P Procedure(s) (LRB): CORONARY ARTERY BYPASS GRAFTING (CABG), ON PUMP, TIMES FOUR, USING BILATERAL INTERNAL MAMMARY ARTERIES, LEFT RADIAL ARTERY HARVESTED OPEN, AND RIGHT ENDOSCOPICALLY HARVESTED GREATER SAPHENOUS VEIN (N/A) TRANSESOPHAGEAL ECHOCARDIOGRAM (TEE) (N/A) RADIAL ARTERY HARVEST (Left) ENDOVEIN HARVEST OF GREATER SAPHENOUS VEIN (Right)  Doing well POD2 s/p CABG for CAD w/ unstable angina Expected post op acute blood loss anemia, mild, stable Expected post op atelectasis Expected post op volume excess Newly diagnosed type II DM   Mobilize  D/C tubes and lines  Gentle diuresis  DAPT, beta blocker, statin  Low dose Imdur for RA graft  Transfer 4E   Purcell Nails, MD 11/16/2020 8:15 AM

## 2020-11-16 NOTE — Progress Notes (Signed)
Pt admitted to [facility] from [facility].  Pt placed on telemetry and CCMD notified.  Pt is A&OX4 and neuro intact.  Vitals taken and all within normal range.  Pt on HFNC at 1L.  CHG bath completed.

## 2020-11-16 NOTE — Progress Notes (Addendum)
Progress Note  Patient Name: Gerald Andrade Date of Encounter: 11/16/2020  Attending physician: Purcell Nails, MD Primary care provider: Kaleen Mask, MD   Subjective: Gerald Andrade is a 50 y.o. male who was seen and examined at bedside at approximately 1220 Patient accompanied by his wife Gerald Andrade at bedside. Chest tubes removed recently. Patient denies any chest pain but has soreness around his sternotomy site. Denies shortness of breath, orthopnea, paroxysmal nocturnal dyspnea or lower extremity swelling.  Objective: Vital Signs in the last 24 hours: Temp:  [98.1 F (36.7 C)-98.8 F (37.1 C)] 98.1 F (36.7 C) (05/07 1100) Pulse Rate:  [77-111] 96 (05/07 0900) Resp:  [11-39] 16 (05/07 0900) BP: (104-127)/(59-74) 107/74 (05/07 0900) SpO2:  [89 %-100 %] 95 % (05/07 0900) Arterial Line BP: (117-157)/(45-73) 138/73 (05/06 1600) FiO2 (%):  [40 %] 40 % (05/07 0400)  Intake/Output:  Intake/Output Summary (Last 24 hours) at 11/16/2020 1214 Last data filed at 11/16/2020 0800 Gross per 24 hour  Intake 1277.69 ml  Output 1780 ml  Net -502.31 ml    Net IO Since Admission: 1,860.64 mL [11/16/20 1214]  Weights:  Filed Weights   11/13/20 0600 11/14/20 0605 11/15/20 0500  Weight: 98.5 kg 97.7 kg 103.7 kg    Telemetry: Normal sinus rhythm-personally reviewed  Physical examination: PHYSICAL EXAM: Vitals with BMI 11/16/2020 11/16/2020 11/16/2020  Height - - -  Weight - - -  BMI - - -  Systolic 107 120 295  Diastolic 74 68 68  Pulse 96 99 86    CONSTITUTIONAL: Well-developed and well-nourished. No acute distress.  SKIN: Skin is warm and dry. No rash noted. No cyanosis. No pallor. No jaundice HEAD: Normocephalic and atraumatic.  EYES: No scleral icterus MOUTH/THROAT: Moist oral membranes.  NECK: No JVD present. No thyromegaly noted. No carotid bruits  LYMPHATIC: No visible cervical adenopathy.  CHEST Normal respiratory effort. No intercostal retractions.   Sternotomy site clean dry and intact. LUNGS: Clear to auscultation bilaterally.  No stridor. No wheezes. No rales.  CARDIOVASCULAR: Regular, positive S1-S2, no murmurs rubs or gallops appreciated. ABDOMINAL: Soft, nontender, nondistended, positive bowel sounds in all 4 quadrants no apparent ascites.  EXTREMITIES: Warm, 2+ dorsalis pedis and posterior tibial pulses, left radial graft site dressing present.  No peripheral edema  HEMATOLOGIC: No significant bruising NEUROLOGIC: Oriented to person, place, and time. Nonfocal. Normal muscle tone.  PSYCHIATRIC: Normal mood and affect. Normal behavior. Cooperative  Lab Results: Hematology Recent Labs  Lab 11/15/20 0359 11/15/20 1628 11/16/20 0500  WBC 18.5* 16.4* 11.4*  RBC 3.76* 3.76* 3.31*  HGB 11.4* 11.4* 10.1*  HCT 33.6* 34.5* 30.5*  MCV 89.4 91.8 92.1  MCH 30.3 30.3 30.5  MCHC 33.9 33.0 33.1  RDW 13.0 13.2 13.3  PLT 183 153 131*    Chemistry Recent Labs  Lab 11/12/20 0350 11/13/20 0611 11/14/20 0354 11/15/20 0359 11/15/20 1628 11/16/20 0500  NA 135 134*   < > 134* 132* 132*  K 3.7 3.8   < > 4.6 4.6 4.2  CL 102 104   < > 106 103 101  CO2 26 23   < > 20* 22 25  GLUCOSE 172* 157*   < > 98 206* 138*  BUN 15 12   < > 15 19 18   CREATININE 1.16 1.00   < > 0.94 1.13 0.98  CALCIUM 9.2 8.6*   < > 7.4* 7.6* 7.9*  PROT 6.7 6.9  --   --   --   --  ALBUMIN 3.6 3.8  --   --   --   --   AST 31 24  --   --   --   --   ALT 61* 53*  --   --   --   --   ALKPHOS 57 59  --   --   --   --   BILITOT 1.1 2.2*  --   --   --   --   GFRNONAA >60 >60   < > >60 >60 >60  ANIONGAP 7 7   < > 8 7 6    < > = values in this interval not displayed.     Cardiac Enzymes: Cardiac Panel (last 3 results) No results for input(s): CKTOTAL, CKMB, TROPONINIHS, RELINDX in the last 72 hours.  BNP (last 3 results) No results for input(s): BNP in the last 8760 hours.  ProBNP (last 3 results) No results for input(s): PROBNP in the last 8760  hours.   DDimer No results for input(s): DDIMER in the last 168 hours.   Hemoglobin A1c:  Lab Results  Component Value Date   HGBA1C 7.9 (H) 11/13/2020   MPG 180.03 11/13/2020    TSH  Recent Labs    11/13/20 0611  TSH 0.651    Lipid Panel     Component Value Date/Time   CHOL 181 11/12/2020 1501   TRIG 100 11/12/2020 1501   HDL 34 (L) 11/12/2020 1501   CHOLHDL 5.3 11/12/2020 1501   VLDL 20 11/12/2020 1501   LDLCALC 127 (H) 11/12/2020 1501    Imaging: DG Chest Port 1 View  Result Date: 11/16/2020 CLINICAL DATA:  Atelectasis. History of diabetes. Follow-up study. CABG surgery on 11/14/2020. EXAM: PORTABLE CHEST 1 VIEW COMPARISON:  11/15/2020 and older studies. FINDINGS: Swan-Ganz catheter has been removed. Right internal jugular introducer sheath, mediastinal tubes and bilateral chest tubes are stable. No mediastinal widening. Bilateral lung base opacities consistent with atelectasis similar to the previous day's exam. Remainder of the lungs is clear. No pneumothorax. IMPRESSION: 1. No acute finding or evidence of an operative complication. 2. Remaining support apparatus is well positioned and stable. 3. Persistent lung base opacities consistent with atelectasis. Electronically Signed   By: 01/15/2021 M.D.   On: 11/16/2020 09:01   DG Chest Port 1 View  Result Date: 11/15/2020 CLINICAL DATA:  Status post CABG EXAM: PORTABLE CHEST 1 VIEW COMPARISON:  11/14/2020 FINDINGS: Removal of ET tube. The Swan-Ganz catheter tip is in the right pulmonary artery. Mediastinal drain and bilateral chest tubes are in place. No pneumothorax visualized. Decreased lung volumes with small pleural effusions and bibasilar atelectasis. IMPRESSION: 1. Chest tubes remain in place.  No pneumothorax. 2. Small bilateral pleural effusions and bibasilar atelectasis. Electronically Signed   By: 01/14/2021 M.D.   On: 11/15/2020 08:39   DG Chest Port 1 View  Result Date: 11/14/2020 CLINICAL DATA:  Status post  coronary bypass grafting EXAM: PORTABLE CHEST 1 VIEW COMPARISON:  11/13/2020 FINDINGS: Postsurgical changes are now seen consistent with the given clinical history of coronary bypass grafting. Endotracheal tube, gastric catheter and mediastinal drain are noted in satisfactory position. Bilateral thoracostomy tubes are noted as well as Swan-Ganz catheter in the right pulmonary artery. No pneumothorax is seen. No atelectasis is noted. No acute bony abnormality is seen. Pericardial drain is noted as well. IMPRESSION: Tubes and lines as described above. No evidence of pneumothorax. Electronically Signed   By: 01/13/2021 M.D.   On: 11/14/2020 16:03  Cardiac database: EKG: 11/15/2020: NSR, 100bpm, without underlying injury pattern.   Echocardiogram: 11/12/2020: 1. Left ventricular ejection fraction, by estimation, is 55 to 60%. The left ventricle has normal function. The left ventricle has no regional wall motion abnormalities. Left ventricular diastolic parameters were normal.  2. Right ventricular systolic function is normal. The right ventricular size is normal.  3. The mitral valve is grossly normal. No evidence of mitral valve regurgitation.  4. The aortic valve is tricuspid. Aortic valve regurgitation is not visualized.   TEE 11/13/2020: Marland Kitchen Left Ventricle: has mildly reduced systolic function, 45-50%. The cavity size was normal. The wall motion is normal. . Right Ventricle: The right ventricle appears unchanged from pre-bypass. . Aorta: The aorta appears unchanged from pre-bypass. . Left Atrial Appendage: The left atrial appendage appears unchanged from pre-bypass. . Aortic Valve: The aortic valve appears unchanged from pre-bypass. . Mitral Valve: The mitral valve appears unchanged from pre-bypass. . Tricuspid Valve: The tricuspid valve appears unchanged from pre-bypass. . Pulmonic Valve: The pulmonic valve appears unchanged from pre-bypass. . Interatrial Septum: The interatrial septum  appears unchanged from pre-bypass. . Pericardium: The pericardium appears unchanged from pre-bypass. . Comments: S/P CABG X 4. No new or worsening wall motion or valvular abnormalities. Mildly reduced systolic function with global hypokinesis. Mild central MR unchanged.  Heart catheterization: Left Heart Catheterization 11/12/20:  LV: Mild LV systolic dysfunction, EF 45% with inferior akinesis.  No significant mitral regurgitation.  Normal EDP.  No pressure gradient across the aortic valve. LM: Large-caliber vessel, normal.  Short. LAD: Large vessel in the proximal segment.  Proximal LAD has an ulcerated lesion.  There is origin of a very large D1 at the proximal segment and a large septal perforator at which point the LAD is occluded and has faint ipsilateral collaterals from the circumflex, diagonal and the SP1, and after reconstitution of the LAD, there is a focal 80% stenosis.  The diagonal 1 is very large and has proximal high-grade 80 to 90% stenosis. CX: Large vessel, giving origin to small OM1 after which it is a CTO with bridging collaterals.  Distal circumflex also receives collaterals from the LAD.  Circumflex also gives collaterals to occluded RCA. RCA: It is occluded in the midsegment after the origin of RV branch.  Distal RCA including PL and PDA branches are large and are collateralized from circumflex and LAD.  Recommendation: Patient has complex multivessel disease and would benefit from CABG with arterial conduits if possible including LIMA to LAD and diagonal and RIMA to RCA and probably a free radial graft to the circumflex.  Surgical consultation has been requested.  Patient needs to be admitted inpatient in view of complex coronary anatomy and high risk for cardiac events.   Scheduled Meds: . acetaminophen  1,000 mg Oral Q6H  . aspirin EC  81 mg Oral Daily  . bisacodyl  10 mg Oral Daily   Or  . bisacodyl  10 mg Rectal Daily  . Chlorhexidine Gluconate Cloth  6 each Topical  Daily  . clopidogrel  75 mg Oral Daily  . docusate sodium  200 mg Oral Daily  . enoxaparin (LOVENOX) injection  40 mg Subcutaneous QHS  . furosemide  40 mg Oral BID  . insulin aspart  0-24 Units Subcutaneous TID AC & HS  . isosorbide mononitrate  15 mg Oral Daily  . metoprolol tartrate  25 mg Oral BID  . mupirocin ointment  1 application Nasal BID  . pantoprazole  40 mg Oral Daily  .  potassium chloride  20 mEq Oral BID  . [START ON 11/18/2020] rosuvastatin  40 mg Oral Daily  . sodium chloride flush  10-40 mL Intracatheter Q12H  . sodium chloride flush  3 mL Intravenous Q12H    Continuous Infusions: . sodium chloride    .  ceFAZolin (ANCEF) IV Stopped (11/16/20 0520)  . lactated ringers Stopped (11/14/20 1702)    PRN Meds: metoprolol tartrate, morphine injection, ondansetron (ZOFRAN) IV, oxyCODONE, sodium chloride flush, sodium chloride flush, traMADol   IMPRESSION & RECOMMENDATIONS: Gerald Andrade is a 50 y.o. male whose past medical history and cardiac risk factors include: Multivessel CAD, status post four-vessel bypass, newly diagnosed diabetes and hyperlipidemia, hypertension, ischemic cardiomyopathy  POD 2 4v CABG (LIMA to LAD, RIMA to PDA, LRadial to OM2, SVG to Diag): Current management by CTS team  Currently on imdur s/p radial graft harvesting.  Chest tubes recently removed. Plans to be transferred out of cardiac ICU to later today Continue medical management   Multivessel CAD: S/p 4v CABG 11/14/2020 Continue ASA, Plavix, metoprolol.  Recommend transitioning to toprol xl priro to dc.   Ischemic cardiomyopathy: Last LVEF 45% Started on Lopressor today. Recommend transitioning to toprol xl prior to dc.  Patient was on Tribenzor prior to the hospitalization. Based on his hemodynamics  and labs in the next 24 to 48 hours recommend up titration of beta-blockers and/or initiating ARB and Farxiga prior to discharge in a stepwise fashion.  Post-op Anemia: expected  / monitor for now.  Hyperlipidemia: most recent lipid profile from 11/12/2020 reviewed. Currently LDL is 127mg /dL recommend goal LDL 70mg /dL. Statin scheduled to start 11/18/2020  NIDDM: -most recent a1c reviewed -monitor glycemic control.   Recommend [facility]/ [facility] for longitudinal care.  CRITICAL CARE Performed by: Tessa LernerSunit Najeh Credit   Total critical care time: 35 minutes   Critical care time was exclusive of separately billable procedures and treating other patients.   Critical care was necessary to treat or prevent imminent or life-threatening deterioration.   Critical care was time spent personally by me on the following activities: development of treatment plan with patient and/or surrogate as well as nursing, discussions with consultants, evaluation of patient's response to treatment, examination of patient, obtaining history from patient or surrogate, ordering and performing treatments and interventions, ordering and review of laboratory studies, ordering and review of radiographic studies, pulse oximetry and re-evaluation of patient's condition.  Patient's questions and concerns were addressed to his satisfaction. He voices understanding of the instructions provided during this encounter.   This note was created using a voice recognition software as a result there may be grammatical errors inadvertently enclosed that do not reflect the nature of this encounter. Every attempt is made to correct such errors.  Delilah ShanSunit Jaliyah Fotheringham, DO, [facility]  Pager: [phone] Office: [phone] 11/16/2020, 12:14 PM

## 2020-11-17 ENCOUNTER — Inpatient Hospital Stay (HOSPITAL_COMMUNITY): Payer: Self-pay

## 2020-11-17 LAB — POCT I-STAT 7, (LYTES, BLD GAS, ICA,H+H)
Acid-base deficit: 3 mmol/L — ABNORMAL HIGH (ref 0.0–2.0)
Acid-base deficit: 5 mmol/L — ABNORMAL HIGH (ref 0.0–2.0)
Acid-base deficit: 5 mmol/L — ABNORMAL HIGH (ref 0.0–2.0)
Bicarbonate: 23.6 mmol/L (ref 20.0–28.0)
Bicarbonate: 21.3 mmol/L (ref 20.0–28.0)
Bicarbonate: 20.7 mmol/L (ref 20.0–28.0)
Calcium, Ion: 1.09 mmol/L — ABNORMAL LOW (ref 1.15–1.40)
Calcium, Ion: 1.08 mmol/L — ABNORMAL LOW (ref 1.15–1.40)
Calcium, Ion: 1.06 mmol/L — ABNORMAL LOW (ref 1.15–1.40)
HCT: 33 % — ABNORMAL LOW (ref 39.0–52.0)
HCT: 32 % — ABNORMAL LOW (ref 39.0–52.0)
HCT: 32 % — ABNORMAL LOW (ref 39.0–52.0)
Hemoglobin: 11.2 g/dL — ABNORMAL LOW (ref 13.0–17.0)
Hemoglobin: 10.9 g/dL — ABNORMAL LOW (ref 13.0–17.0)
Hemoglobin: 10.9 g/dL — ABNORMAL LOW (ref 13.0–17.0)
O2 Saturation: 99 %
O2 Saturation: 96 %
O2 Saturation: 95 %
Patient temperature: 37.3
Patient temperature: 37.8
Patient temperature: 38.5
Potassium: 4.4 mmol/L (ref 3.5–5.1)
Potassium: 5.1 mmol/L (ref 3.5–5.1)
Potassium: 5 mmol/L (ref 3.5–5.1)
Sodium: 138 mmol/L (ref 135–145)
Sodium: 138 mmol/L (ref 135–145)
Sodium: 138 mmol/L (ref 135–145)
TCO2: 25 mmol/L (ref 22–32)
TCO2: 23 mmol/L (ref 22–32)
TCO2: 22 mmol/L (ref 22–32)
pCO2 arterial: 46.5 mmHg (ref 32.0–48.0)
pCO2 arterial: 43.6 mmHg (ref 32.0–48.0)
pCO2 arterial: 41.4 mmHg (ref 32.0–48.0)
pH, Arterial: 7.315 — ABNORMAL LOW (ref 7.350–7.450)
pH, Arterial: 7.301 — ABNORMAL LOW (ref 7.350–7.450)
pH, Arterial: 7.314 — ABNORMAL LOW (ref 7.350–7.450)
pO2, Arterial: 157 mmHg — ABNORMAL HIGH (ref 83.0–108.0)
pO2, Arterial: 94 mmHg (ref 83.0–108.0)
pO2, Arterial: 90 mmHg (ref 83.0–108.0)

## 2020-11-17 LAB — BASIC METABOLIC PANEL
Anion gap: 9 (ref 5–15)
BUN: 21 mg/dL — ABNORMAL HIGH (ref 6–20)
CO2: 22 mmol/L (ref 22–32)
Calcium: 8.3 mg/dL — ABNORMAL LOW (ref 8.9–10.3)
Chloride: 100 mmol/L (ref 98–111)
Creatinine, Ser: 1.07 mg/dL (ref 0.61–1.24)
GFR, Estimated: >60 mL/min (ref >60)
Glucose, Bld: 171 mg/dL — ABNORMAL HIGH (ref 70–99)
Potassium: 4.8 mmol/L (ref 3.5–5.1)
Sodium: 131 mmol/L — ABNORMAL LOW (ref 135–145)

## 2020-11-17 LAB — CBC
HCT: 32.7 % — ABNORMAL LOW (ref 39.0–52.0)
Hemoglobin: 10.9 g/dL — ABNORMAL LOW (ref 13.0–17.0)
MCH: 30.2 pg (ref 26.0–34.0)
MCHC: 33.3 g/dL (ref 30.0–36.0)
MCV: 90.6 fL (ref 80.0–100.0)
Platelets: 215 10*3/uL (ref 150–400)
RBC: 3.61 MIL/uL — ABNORMAL LOW (ref 4.22–5.81)
RDW: 13.3 % (ref 11.5–15.5)
WBC: 17.7 10*3/uL — ABNORMAL HIGH (ref 4.0–10.5)
nRBC: 0 % (ref 0.0–0.2)

## 2020-11-17 LAB — GLUCOSE, CAPILLARY
Glucose-Capillary: 160 mg/dL — ABNORMAL HIGH (ref 70–99)
Glucose-Capillary: 195 mg/dL — ABNORMAL HIGH (ref 70–99)
Glucose-Capillary: 190 mg/dL — ABNORMAL HIGH (ref 70–99)
Glucose-Capillary: 169 mg/dL — ABNORMAL HIGH (ref 70–99)

## 2020-11-17 MED ORDER — AMIODARONE HCL IN DEXTROSE 360-4.14 MG/200ML-% IV SOLN
30.0000 mg/h | INTRAVENOUS | Status: DC
  Administered 2020-11-17: 30 mg/h via INTRAVENOUS
  Filled 2020-11-17: qty 200

## 2020-11-17 MED ORDER — METOPROLOL TARTRATE 50 MG PO TABS
50.0000 mg | ORAL_TABLET | Freq: Two times a day (BID) | ORAL | Status: DC
  Administered 2020-11-17 – 2020-11-19 (×4): 50 mg via ORAL
  Filled 2020-11-17 (×4): qty 1

## 2020-11-17 MED ORDER — AMIODARONE HCL IN DEXTROSE 360-4.14 MG/200ML-% IV SOLN
60.0000 mg/h | INTRAVENOUS | Status: AC
  Administered 2020-11-17: 60 mg/h via INTRAVENOUS
  Filled 2020-11-17: qty 200

## 2020-11-17 MED ORDER — AMIODARONE IV BOLUS ONLY 150 MG/100ML
150.0000 mg | Freq: Once | INTRAVENOUS | Status: DC
  Filled 2020-11-17: qty 100

## 2020-11-17 MED ORDER — METOPROLOL TARTRATE 25 MG PO TABS: 37.5000 mg | ORAL_TABLET | Freq: Two times a day (BID) | ORAL | Status: DC

## 2020-11-17 MED ORDER — TAMSULOSIN HCL 0.4 MG PO CAPS
0.4000 mg | ORAL_CAPSULE | Freq: Every day | ORAL | Status: DC
  Administered 2020-11-17 – 2020-11-22 (×6): 0.4 mg via ORAL
  Filled 2020-11-17 (×6): qty 1

## 2020-11-17 MED ORDER — KETOROLAC TROMETHAMINE 30 MG/ML IJ SOLN
30.0000 mg | Freq: Once | INTRAMUSCULAR | Status: AC
  Administered 2020-11-17: 30 mg via INTRAVENOUS
  Filled 2020-11-17: qty 1

## 2020-11-17 NOTE — Progress Notes (Signed)
Pt in Atrial fibrillation w/ RVR. PA notified.  Amiodarone protocol ordered.

## 2020-11-17 NOTE — Progress Notes (Signed)
Progress Note  Patient Name: Gerald Andrade Date of Encounter: 11/17/2020  Attending physician: Purcell Nails, MD Primary care provider: Kaleen Mask, MD Primary Cardiologist: Dr. Truett Mainland  Subjective: Gerald Andrade is a 50 y.o. male who was seen and examined at bedside at approximately 220pm No events overnight. Denies chest pain and shortness of breath at rest or with activities. Patient is anxious and chest soreness at the sternotomy site. Went into atrial fibrillation for several hours currently on amiodarone drip and transitioned back to NSR.  Case discussed and reviewed with his nurse.  Objective: Vital Signs in the last 24 hours: Temp:  [98 F (36.7 C)-99.2 F (37.3 C)] 98.5 F (36.9 C) (05/08 1107) Pulse Rate:  [79-102] 79 (05/08 1107) Resp:  [10-20] 19 (05/08 1107) BP: (118-135)/(71-88) 119/78 (05/08 1107) SpO2:  [91 %-94 %] 94 % (05/08 1107) Weight:  [107.8 kg] 107.8 kg (05/08 0418)  Intake/Output:  Intake/Output Summary (Last 24 hours) at 11/17/2020 1424 Last data filed at 11/17/2020 1100 Gross per 24 hour  Intake 110 ml  Output 300 ml  Net -190 ml    Net IO Since Admission: 1,670.64 mL [11/17/20 1424]  Weights:  Filed Weights   11/14/20 0605 11/15/20 0500 11/17/20 0418  Weight: 97.7 kg 103.7 kg 107.8 kg    Telemetry: Currently normal sinus rhythm-personally reviewed.  Episodes of Afib   Physical examination: PHYSICAL EXAM: Vitals with BMI 11/17/2020 11/17/2020 11/17/2020  Height - - -  Weight - - -  BMI - - -  Systolic 119 - 132  Diastolic 78 - 88  Pulse 79 91 99    CONSTITUTIONAL: Well-developed and well-nourished. No acute distress.  SKIN: Skin is warm and dry. No rash noted. No cyanosis. No pallor. No jaundice HEAD: Normocephalic and atraumatic.  EYES: No scleral icterus MOUTH/THROAT: Moist oral membranes.  NECK: No JVD present. No thyromegaly noted. No carotid bruits  LYMPHATIC: No visible cervical adenopathy.   CHEST Normal respiratory effort. No intercostal retractions.  Sternotomy site clean dry and intact. LUNGS: Clear to auscultation bilaterally.  No stridor. No wheezes. No rales.  CARDIOVASCULAR: Regular, positive S1-S2, no murmurs rubs or gallops appreciated. ABDOMINAL: Soft, nontender, nondistended, positive bowel sounds in all 4 quadrants no apparent ascites.  EXTREMITIES: Warm, 2+ dorsalis pedis and posterior tibial pulses, left radial graft site dressing present.  No peripheral edema  HEMATOLOGIC: No significant bruising NEUROLOGIC: Oriented to person, place, and time. Nonfocal. Normal muscle tone.  PSYCHIATRIC: Normal mood and affect. Normal behavior. Cooperative  Lab Results: Hematology Recent Labs  Lab 11/15/20 1628 11/16/20 0500 11/17/20 0035  WBC 16.4* 11.4* 17.7*  RBC 3.76* 3.31* 3.61*  HGB 11.4* 10.1* 10.9*  HCT 34.5* 30.5* 32.7*  MCV 91.8 92.1 90.6  MCH 30.3 30.5 30.2  MCHC 33.0 33.1 33.3  RDW 13.2 13.3 13.3  PLT 153 131* 215    Chemistry Recent Labs  Lab 11/12/20 0350 11/13/20 0611 11/14/20 0354 11/15/20 1628 11/16/20 0500 11/17/20 0035  NA 135 134*   < > 132* 132* 131*  K 3.7 3.8   < > 4.6 4.2 4.8  CL 102 104   < > 103 101 100  CO2 26 23   < > 22 25 22   GLUCOSE 172* 157*   < > 206* 138* 171*  BUN 15 12   < > 19 18 21*  CREATININE 1.16 1.00   < > 1.13 0.98 1.07  CALCIUM 9.2 8.6*   < > 7.6* 7.9* 8.3*  PROT 6.7 6.9  --   --   --   --   ALBUMIN 3.6 3.8  --   --   --   --   AST 31 24  --   --   --   --   ALT 61* 53*  --   --   --   --   ALKPHOS 57 59  --   --   --   --   BILITOT 1.1 2.2*  --   --   --   --   GFRNONAA >60 >60   < > >60 >60 >60  ANIONGAP 7 7   < > 7 6 9    < > = values in this interval not displayed.     Cardiac Enzymes: Cardiac Panel (last 3 results) No results for input(s): CKTOTAL, CKMB, TROPONINIHS, RELINDX in the last 72 hours.  BNP (last 3 results) No results for input(s): BNP in the last 8760 hours.  ProBNP (last 3  results) No results for input(s): PROBNP in the last 8760 hours.   DDimer No results for input(s): DDIMER in the last 168 hours.   Hemoglobin A1c:  Lab Results  Component Value Date   HGBA1C 7.9 (H) 11/13/2020   MPG 180.03 11/13/2020    TSH  Recent Labs    11/13/20 0611  TSH 0.651    Lipid Panel     Component Value Date/Time   CHOL 181 11/12/2020 1501   TRIG 100 11/12/2020 1501   HDL 34 (L) 11/12/2020 1501   CHOLHDL 5.3 11/12/2020 1501   VLDL 20 11/12/2020 1501   LDLCALC 127 (H) 11/12/2020 1501    Imaging: DG Chest 2 View  Result Date: 11/17/2020 CLINICAL DATA:  S/p CABG x 4, atelectasis EXAM: CHEST - 2 VIEW COMPARISON:  Chest x-ray 11/16/2020, CT angio chest 11/12/2020, chest x-ray 11/14/2022 FINDINGS: Pacing wires again noted. Interval removal of right internal jugular central venous Cordis, mediastinal drain, bilateral chest tubes (time 2 on the left). The heart size and mediastinal contours are unchanged. Cardiac surgical changes overlie the mediastinum. Slightly improved right base aeration/decreased atelectasis. Persistent patchy airspace opacity within the paramediastinal right middle lobe there is persistent cyst 11/14/2020. No focal consolidation. No pulmonary edema. Possible trace residual pleural effusions. No pneumothorax. No acute osseous abnormality. IMPRESSION: 1. Improved right basal aeration/decreased atelectasis. 2. Persistent patchy airspace opacity within the paramediastinal right middle lobe there is persistent cyst 11/14/2020. 3. Interval removal of a right internal jugular central venous catheter Cordis, mediastinal drain, and bilateral chest tubes. Electronically Signed   By: 01/14/2021 M.D.   On: 11/17/2020 06:43   DG Chest Port 1 View  Result Date: 11/16/2020 CLINICAL DATA:  Atelectasis. History of diabetes. Follow-up study. CABG surgery on 11/14/2020. EXAM: PORTABLE CHEST 1 VIEW COMPARISON:  11/15/2020 and older studies. FINDINGS: Swan-Ganz catheter  has been removed. Right internal jugular introducer sheath, mediastinal tubes and bilateral chest tubes are stable. No mediastinal widening. Bilateral lung base opacities consistent with atelectasis similar to the previous day's exam. Remainder of the lungs is clear. No pneumothorax. IMPRESSION: 1. No acute finding or evidence of an operative complication. 2. Remaining support apparatus is well positioned and stable. 3. Persistent lung base opacities consistent with atelectasis. Electronically Signed   By: 01/15/2021 M.D.   On: 11/16/2020 09:01    Cardiac database: EKG: 11/15/2020: NSR, 100bpm, without underlying injury pattern.   Echocardiogram: 11/12/2020: 1. Left ventricular ejection fraction, by estimation, is 55 to 60%.  The left ventricle has normal function. The left ventricle has no regional wall motion abnormalities. Left ventricular diastolic parameters were normal.  2. Right ventricular systolic function is normal. The right ventricular size is normal.  3. The mitral valve is grossly normal. No evidence of mitral valve regurgitation.  4. The aortic valve is tricuspid. Aortic valve regurgitation is not visualized.   TEE 11/13/2020: Marland Kitchen. Left Ventricle: has mildly reduced systolic function, 45-50%. The cavity size was normal. The wall motion is normal. . Right Ventricle: The right ventricle appears unchanged from pre-bypass. . Aorta: The aorta appears unchanged from pre-bypass. . Left Atrial Appendage: The left atrial appendage appears unchanged from pre-bypass. . Aortic Valve: The aortic valve appears unchanged from pre-bypass. . Mitral Valve: The mitral valve appears unchanged from pre-bypass. . Tricuspid Valve: The tricuspid valve appears unchanged from pre-bypass. . Pulmonic Valve: The pulmonic valve appears unchanged from pre-bypass. . Interatrial Septum: The interatrial septum appears unchanged from pre-bypass. . Pericardium: The pericardium appears unchanged from  pre-bypass. . Comments: S/P CABG X 4. No new or worsening wall motion or valvular abnormalities. Mildly reduced systolic function with global hypokinesis. Mild central MR unchanged.  Heart catheterization: Left Heart Catheterization 11/12/20:  LV: Mild LV systolic dysfunction, EF 45% with inferior akinesis.  No significant mitral regurgitation.  Normal EDP.  No pressure gradient across the aortic valve. LM: Large-caliber vessel, normal.  Short. LAD: Large vessel in the proximal segment.  Proximal LAD has an ulcerated lesion.  There is origin of a very large D1 at the proximal segment and a large septal perforator at which point the LAD is occluded and has faint ipsilateral collaterals from the circumflex, diagonal and the SP1, and after reconstitution of the LAD, there is a focal 80% stenosis.  The diagonal 1 is very large and has proximal high-grade 80 to 90% stenosis. CX: Large vessel, giving origin to small OM1 after which it is a CTO with bridging collaterals.  Distal circumflex also receives collaterals from the LAD.  Circumflex also gives collaterals to occluded RCA. RCA: It is occluded in the midsegment after the origin of RV branch.  Distal RCA including PL and PDA branches are large and are collateralized from circumflex and LAD.  Recommendation: Patient has complex multivessel disease and would benefit from CABG with arterial conduits if possible including LIMA to LAD and diagonal and RIMA to RCA and probably a free radial graft to the circumflex.  Surgical consultation has been requested.  Patient needs to be admitted inpatient in view of complex coronary anatomy and high risk for cardiac events.   Scheduled Meds: . acetaminophen  1,000 mg Oral Q6H  . aspirin EC  81 mg Oral Daily  . bisacodyl  10 mg Oral Daily   Or  . bisacodyl  10 mg Rectal Daily  . clopidogrel  75 mg Oral Daily  . docusate sodium  200 mg Oral Daily  . enoxaparin (LOVENOX) injection  40 mg Subcutaneous QHS  .  furosemide  40 mg Oral BID  . isosorbide mononitrate  15 mg Oral Daily  . metoprolol tartrate  50 mg Oral BID  . mupirocin ointment  1 application Nasal BID  . pantoprazole  40 mg Oral Daily  . [START ON 11/18/2020] rosuvastatin  40 mg Oral Daily  . sodium chloride flush  10-40 mL Intracatheter Q12H  . sodium chloride flush  3 mL Intravenous Q12H  . tamsulosin  0.4 mg Oral Daily    Continuous Infusions: . sodium chloride    .  amiodarone 60 mg/hr (11/17/20 1042)  . amiodarone    . amiodarone    . lactated ringers Stopped (11/14/20 1702)    PRN Meds: ALPRAZolam, metoprolol tartrate, ondansetron (ZOFRAN) IV, oxyCODONE, sodium chloride flush, sodium chloride flush, traMADol   IMPRESSION & RECOMMENDATIONS: Gerald Andrade is a 50 y.o. male whose past medical history and cardiac risk factors include: Multivessel CAD, status post four-vessel bypass, newly diagnosed diabetes and hyperlipidemia, hypertension, ischemic cardiomyopathy  POD 3 4v CABG (LIMA to LAD, RIMA to PDA, LRadial to OM2, SVG to Diag): Current management by CTS team  Currently on imdur s/p radial graft harvesting.  Chest tubes recently removed. EPW present  Continue medical management   Postop atrial fibrillation: Telemetry reviewed. Recommend obtaining an EKG if he were to have another episode of Afib. -spoke to nursing staff. Currently on IV amiodarone drip. Currently normal sinus rhythm. Continue telemetry  Consider OAC if he has prolong / increased burden of Afib during hospitalization and once EPW are d/c. Will defer to CT surgery.   Multivessel CAD: S/p 4v CABG 11/14/2020 Continue ASA, Plavix, metoprolol.  Recommend transitioning to Toprol XL prior to dc.   Ischemic cardiomyopathy: Last LVEF 45-50% Uptitrated beta-blocker therapy. Recommend transitioning to Toprol XL prior to dc.  Patient was on Tribenzor prior to the hospitalization. Recommend starting low-dose ARB and/or Farxiga prior to discharge  in a stepwise fashion as BP and Scr allows.  Post-op Anemia: expected / monitor for now.  Hyperlipidemia: most recent lipid profile from 11/12/2020 reviewed. Currently LDL is 127mg /dL recommend goal LDL 70mg /dL. Statin scheduled to start 11/18/2020  NIDDM: -most recent a1c reviewed -monitor glycemic control.   Will follow peripherally. Recommend outpatient follow up w/ [facility] for longitudinal care.  Patient's questions and concerns were addressed to his satisfaction. He voices understanding of the instructions provided during this encounter.   This note was created using a voice recognition software as a result there may be grammatical errors inadvertently enclosed that do not reflect the nature of this encounter. Every attempt is made to correct such errors.  [facility]  Pager: [phone] Office: [phone] 11/17/2020, 2:24 PM

## 2020-11-17 NOTE — Progress Notes (Signed)
Mobility Specialist: Progress Note   11/17/20 1800  Mobility  Activity  (Stood at chair)  Level of Assistance Contact guard assist, steadying assist  Assistive Device None  Mobility Response Tolerated well  Mobility performed by Mobility specialist  Bed Position Chair   Assisted pt with standing to use urinal with pt's wife. Pt slightly unsteady after standing for a few minutes. Pt overall tired and groggy today. Will let pt get some rest today and progress ambulation tomorrow.   [facility] Alliya Marcon Mobility Specialist Mobility Specialist Phone: [phone]

## 2020-11-17 NOTE — Progress Notes (Addendum)
[address]       [place] [address]             [phone]      3 Days Post-Op Procedure(s) (LRB): CORONARY ARTERY BYPASS GRAFTING (CABG), ON PUMP, TIMES FOUR, USING BILATERAL INTERNAL MAMMARY ARTERIES, LEFT RADIAL ARTERY HARVESTED OPEN, AND RIGHT ENDOSCOPICALLY HARVESTED GREATER SAPHENOUS VEIN (N/A) TRANSESOPHAGEAL ECHOCARDIOGRAM (TEE) (N/A) RADIAL ARTERY HARVEST (Left) ENDOVEIN HARVEST OF GREATER SAPHENOUS VEIN (Right)   Subjective:  Patient up on side of bed trying to void.  He states he felt like he had to go but is having difficulty.  He voided w/o issue yesterday after foley removal.  He complains of pain.  Also experiencing anxiety and I was contacted last night for medications.  Objective: Vital signs in last 24 hours: Temp:  [98 F (36.7 C)-99.2 F (37.3 C)] 98 F (36.7 C) (05/08 0817) Pulse Rate:  [85-102] 99 (05/08 0817) Cardiac Rhythm: Normal sinus rhythm (05/07 1900) Resp:  [10-20] 16 (05/08 0817) BP: (98-135)/(63-88) 132/88 (05/08 0817) SpO2:  [87 %-97 %] 93 % (05/08 0817) Weight:  [107.8 kg] 107.8 kg (05/08 0418)  Intake/Output from previous day: 05/07 0701 - 05/08 0700 In: 680 [P.O.:480; IV Piggyback:200] Out: 445 [Urine:215; Chest Tube:230]  General appearance: alert, cooperative and no distress Heart: regular rate and rhythm Lungs: clear to auscultation bilaterally Abdomen: soft, non-tender; bowel sounds normal; no masses,  no organomegaly Extremities: edema trace Wound: clean and dry  Lab Results: Recent Labs    11/16/20 0500 11/17/20 0035  WBC 11.4* 17.7*  HGB 10.1* 10.9*  HCT 30.5* 32.7*  PLT 131* 215   BMET:  Recent Labs    11/16/20 0500 11/17/20 0035  NA 132* 131*  K 4.2 4.8  CL 101 100  CO2 25 22  GLUCOSE 138* 171*  BUN 18 21*  CREATININE 0.98 1.07  CALCIUM 7.9* 8.3*    PT/INR:  Recent Labs    11/14/20 1523  LABPROT 17.3*  INR 1.4*   ABG    Component Value Date/Time   PHART 7.314 (L)  11/14/2020 1954   HCO3 20.7 11/14/2020 1954   TCO2 22 11/14/2020 1954   ACIDBASEDEF 5.0 (H) 11/14/2020 1954   O2SAT 95.0 11/14/2020 1954   CBG (last 3)  Recent Labs    11/16/20 1557 11/16/20 2113 11/17/20 0625  GLUCAP 175* 150* 160*    Assessment/Plan: S/P Procedure(s) (LRB): CORONARY ARTERY BYPASS GRAFTING (CABG), ON PUMP, TIMES FOUR, USING BILATERAL INTERNAL MAMMARY ARTERIES, LEFT RADIAL ARTERY HARVESTED OPEN, AND RIGHT ENDOSCOPICALLY HARVESTED GREATER SAPHENOUS VEIN (N/A) TRANSESOPHAGEAL ECHOCARDIOGRAM (TEE) (N/A) RADIAL ARTERY HARVEST (Left) ENDOVEIN HARVEST OF GREATER SAPHENOUS VEIN (Right)  1. CV- NSR, brief episode of elevated HR, returned very quickly- will increase Lopressor to 37.5 mg BID, continue Imdur for radial artery graft 2. Pulm- no acute issues, CTs out, CXR w/o pneumothorax, no significant effusions, continue IS 3. Renal- creatinine WNL, remains volume overloaded, lasix ordered, K is at 4.8, will d/c potassium supplement 4. GU- difficulty voiding, will add flomax, nurse to bladder scan if needed 5. CBGs controlled, not a diabetic d/c SSIP 6. Dispo- patient stable, will titrate BB to 37.5 mg for additional HR control, will plan to remove EPW in AM if no arrhythmia develops, stop potassium as K is 4.8, add flomax for difficulty with voiding, patient needs to ambulate requires quite a bit of encouragement, continue current care, possibly for d/c home Tuesday   LOS: 4 days  Lowella Dandy, PA-C 11/17/2020   I have seen and examined the patient and agree with the assessment and plan as outlined.  Brief episode PAF this morning, now on IV amiodarone.  Continue to mobilize.  Increase beta blocker.  Purcell Nails, MD 11/17/2020 10:31 AM

## 2020-11-18 LAB — GLUCOSE, CAPILLARY
Glucose-Capillary: 178 mg/dL — ABNORMAL HIGH (ref 70–99)
Glucose-Capillary: 177 mg/dL — ABNORMAL HIGH (ref 70–99)
Glucose-Capillary: 197 mg/dL — ABNORMAL HIGH (ref 70–99)
Glucose-Capillary: 152 mg/dL — ABNORMAL HIGH (ref 70–99)
Glucose-Capillary: 149 mg/dL — ABNORMAL HIGH (ref 70–99)

## 2020-11-18 LAB — BASIC METABOLIC PANEL
Anion gap: 6 (ref 5–15)
BUN: 26 mg/dL — ABNORMAL HIGH (ref 6–20)
CO2: 28 mmol/L (ref 22–32)
Calcium: 8.3 mg/dL — ABNORMAL LOW (ref 8.9–10.3)
Chloride: 99 mmol/L (ref 98–111)
Creatinine, Ser: 0.94 mg/dL (ref 0.61–1.24)
GFR, Estimated: >60 mL/min (ref >60)
Glucose, Bld: 210 mg/dL — ABNORMAL HIGH (ref 70–99)
Potassium: 4.4 mmol/L (ref 3.5–5.1)
Sodium: 133 mmol/L — ABNORMAL LOW (ref 135–145)

## 2020-11-18 MED ORDER — AMIODARONE HCL 200 MG PO TABS
400.0000 mg | ORAL_TABLET | Freq: Two times a day (BID) | ORAL | Status: DC
  Administered 2020-11-18 – 2020-11-22 (×9): 400 mg via ORAL
  Filled 2020-11-18 (×9): qty 2

## 2020-11-18 MED ORDER — FUROSEMIDE 40 MG PO TABS
40.0000 mg | ORAL_TABLET | Freq: Two times a day (BID) | ORAL | Status: AC
  Administered 2020-11-18 – 2020-11-19 (×4): 40 mg via ORAL
  Filled 2020-11-18 (×4): qty 1

## 2020-11-18 MED ORDER — INSULIN ASPART 100 UNIT/ML IJ SOLN
0.0000 [IU] | Freq: Three times a day (TID) | INTRAMUSCULAR | Status: DC
  Administered 2020-11-18 (×2): 3 [IU] via SUBCUTANEOUS
  Administered 2020-11-19: 2 [IU] via SUBCUTANEOUS
  Administered 2020-11-19 – 2020-11-20 (×3): 3 [IU] via SUBCUTANEOUS
  Administered 2020-11-20: 2 [IU] via SUBCUTANEOUS

## 2020-11-18 MED ORDER — LIVING WELL WITH DIABETES BOOK
Freq: Once | Status: AC
  Filled 2020-11-18: qty 1

## 2020-11-18 MED ORDER — POTASSIUM CHLORIDE CRYS ER 20 MEQ PO TBCR
20.0000 meq | EXTENDED_RELEASE_TABLET | Freq: Two times a day (BID) | ORAL | Status: DC
  Administered 2020-11-18 (×2): 20 meq via ORAL
  Filled 2020-11-18 (×2): qty 1

## 2020-11-18 MED ORDER — INSULIN DETEMIR 100 UNIT/ML ~~LOC~~ SOLN
20.0000 [IU] | Freq: Every day | SUBCUTANEOUS | Status: DC
  Administered 2020-11-18 – 2020-11-19 (×2): 20 [IU] via SUBCUTANEOUS
  Filled 2020-11-18 (×3): qty 0.2

## 2020-11-18 MED FILL — Lidocaine HCl Local Soln Prefilled Syringe 100 MG/5ML (2%): INTRAMUSCULAR | Qty: 5 | Status: AC

## 2020-11-18 MED FILL — Sodium Chloride IV Soln 0.9%: INTRAVENOUS | Qty: 3000 | Status: AC

## 2020-11-18 MED FILL — Heparin Sodium (Porcine) Inj 1000 Unit/ML: INTRAMUSCULAR | Qty: 30 | Status: AC

## 2020-11-18 MED FILL — Mannitol IV Soln 20%: INTRAVENOUS | Qty: 500 | Status: AC

## 2020-11-18 MED FILL — Sodium Bicarbonate IV Soln 8.4%: INTRAVENOUS | Qty: 50 | Status: AC

## 2020-11-18 MED FILL — Albumin, Human Inj 5%: INTRAVENOUS | Qty: 250 | Status: AC

## 2020-11-18 MED FILL — Electrolyte-R (PH 7.4) Solution: INTRAVENOUS | Qty: 4000 | Status: AC

## 2020-11-18 MED FILL — Magnesium Sulfate Inj 50%: INTRAMUSCULAR | Qty: 10 | Status: AC

## 2020-11-18 MED FILL — Potassium Chloride Inj 2 mEq/ML: INTRAVENOUS | Qty: 40 | Status: AC

## 2020-11-18 NOTE — Evaluation (Signed)
Physical Therapy Evaluation Patient Details Name: Gerald Andrade MRN: [id number] DOB: 1970/11/19 Today's Date: 11/18/2020   History of Present Illness  Pt is a 50 year old male with PMH of HTN and COPD. He presented to the ED for evaluation of chest pain. 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    Clinical Impression  Pt admitted with above diagnosis. PTA pt active and independent. 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 On eval, he required min assist bed mobility, min assist transfers, and min guard assist ambulation 250' with RW. SpO2 97% at rest on 2L. Mobilized on 3L with SpO2 95%. Max HR 110. Pt currently with functional limitations due to the deficits listed below (see PT Problem List). Pt will benefit from skilled PT to increase their independence and safety with mobility to allow discharge to the venue listed below.       Follow Up Recommendations Home health PT;Supervision for mobility/OOB    Equipment Recommendations  Rolling walker with 5" wheels    Recommendations for Other Services       Precautions / Restrictions Precautions Precautions: Sternal Precaution Comments: educated/reviewed sternal precautions      Mobility  Bed Mobility Overal bed mobility: Needs Assistance Bed Mobility: Supine to Sit;Sit to Supine     Supine to sit: Min assist Sit to supine: Min assist;HOB elevated   General bed mobility comments: increased time, cues for sequencing/precautions    Transfers Overall transfer level: Needs assistance Equipment used: Rolling walker (2 wheeled) Transfers: Sit to/from Stand Sit to Stand: Min assist         General transfer comment: assist to power up, cues for hand placement/precautions  Ambulation/Gait Ambulation/Gait assistance: Min guard Gait Distance (Feet): 250 Feet Assistive device: Rolling walker (2 wheeled) Gait Pattern/deviations: Step-through  pattern;Decreased stride length Gait velocity: decreased Gait velocity interpretation: 1.31 - 2.62 ft/sec, indicative of limited community ambulator General Gait Details: min guard for safety, Amb on 3L with SpO2 95%  Stairs            Wheelchair Mobility    Modified Rankin (Stroke Patients Only)       Balance Overall balance assessment: Mild deficits observed, not formally tested                                           Pertinent Vitals/Pain Pain Assessment: Faces Faces Pain Scale: Hurts even more Pain Location: sternal incision Pain Descriptors / Indicators: Grimacing;Guarding Pain Intervention(s): Monitored during session;Repositioned    Home Living Family/patient expects to be discharged to:: Private residence Living Arrangements: Spouse/significant other;Parent Available Help at Discharge: Family;Available 24 hours/day Type of Home: House Home Access: Ramped entrance     Home Layout: Able to live on main level with bedroom/bathroom Home Equipment: None      Prior Function Level of Independence: Independent         Comments: works as a Conservation officer, nature Extremity Assessment Upper Extremity Assessment:  (sternal precautions)    Lower Extremity Assessment Lower Extremity Assessment: Generalized weakness    Cervical / Trunk Assessment Cervical / Trunk Assessment: Normal  Communication   Communication: No difficulties  Cognition Arousal/Alertness: Awake/alert Behavior During Therapy: WFL for tasks assessed/performed Overall Cognitive Status: Within  Functional Limits for tasks assessed                                        General Comments General comments (skin integrity, edema, etc.): SpO2 97% at rest on 2L    Exercises     Assessment/Plan    PT Assessment Patient needs continued PT services  PT Problem List Decreased strength;Decreased  mobility;Decreased knowledge of precautions;Decreased activity tolerance;Cardiopulmonary status limiting activity;Pain;Decreased balance;Decreased knowledge of use of DME       PT Treatment Interventions DME instruction;Therapeutic activities;Gait training;Therapeutic exercise;Patient/family education;Balance training;Functional mobility training    PT Goals (Current goals can be found in the Care Plan section)  Acute Rehab PT Goals Patient Stated Goal: home PT Goal Formulation: With patient/family Time For Goal Achievement: 12/02/20 Potential to Achieve Goals: Good    Frequency Min 3X/week   Barriers to discharge        Co-evaluation               AM-PAC PT "6 Clicks" Mobility  Outcome Measure Help needed turning from your back to your side while in a flat bed without using bedrails?: A Little Help needed moving from lying on your back to sitting on the side of a flat bed without using bedrails?: A Little Help needed moving to and from a bed to a chair (including a wheelchair)?: A Little Help needed standing up from a chair using your arms (e.g., wheelchair or bedside chair)?: A Little Help needed to walk in hospital room?: A Little Help needed climbing 3-5 steps with a railing? : A Lot 6 Click Score: 17    End of Session Equipment Utilized During Treatment: Gait belt;Oxygen Activity Tolerance: Patient tolerated treatment well Patient left: in bed;with call bell/phone within reach Nurse Communication: Mobility status PT Visit Diagnosis: Difficulty in walking, not elsewhere classified (R26.2);Pain;Muscle weakness (generalized) (M62.81)    Time: 7062-3762 PT Time Calculation (min) (ACUTE ONLY): 32 min   Charges:   PT Evaluation $PT Eval Moderate Complexity: 1 Mod PT Treatments $Gait Training: 8-22 mins        Gerald Andrade, PT  Office # [phone] Pager [phone]   Gerald Andrade 11/18/2020, 11:17 AM

## 2020-11-18 NOTE — Progress Notes (Signed)
Removed epicardial wires per order. 3 intact.  Pt tolerated procedure well.  Pt instructed to remain on bedrest for one hour.  Frequent vitals will be taken and documented. Pt resting with call bell within reach.  

## 2020-11-18 NOTE — Progress Notes (Addendum)
     [address]       [place] [address]             [phone]      4 Days Post-Op Procedure(s) (LRB): CORONARY ARTERY BYPASS GRAFTING (CABG), ON PUMP, TIMES FOUR, USING BILATERAL INTERNAL MAMMARY ARTERIES, LEFT RADIAL ARTERY HARVESTED OPEN, AND RIGHT ENDOSCOPICALLY HARVESTED GREATER SAPHENOUS VEIN (N/A) TRANSESOPHAGEAL ECHOCARDIOGRAM (TEE) (N/A) RADIAL ARTERY HARVEST (Left) ENDOVEIN HARVEST OF GREATER SAPHENOUS VEIN (Right)   Subjective:  Patient appears somnolent, sleepy up in chair.  Didn't walk yesterday.  No BM, + flatus  Objective: Vital signs in last 24 hours: Temp:  [97.9 F (36.6 C)-98.8 F (37.1 C)] 98.4 F (36.9 C) (05/09 0030) Pulse Rate:  [76-99] 88 (05/09 0402) Cardiac Rhythm: Atrial fibrillation;Sinus tachycardia (05/09 0330) Resp:  [11-19] 16 (05/09 0402) BP: (108-137)/(65-88) 137/74 (05/09 0402) SpO2:  [91 %-100 %] 100 % (05/09 0402) Weight:  [104.5 kg] 104.5 kg (05/09 0402)  Intake/Output from previous day: 05/08 0701 - 05/09 0700 In: 414.8 [P.O.:110; I.V.:304.8] Out: 1100 [Urine:1100]  General appearance: cooperative and no distress Heart: regular rate and rhythm Lungs: diminished breath sounds bibasilar Abdomen: soft, non-tender; bowel sounds normal; no masses,  no organomegaly Extremities: edema trace Wound: clean and dr  Lab Results: Recent Labs    11/16/20 0500 11/17/20 0035  WBC 11.4* 17.7*  HGB 10.1* 10.9*  HCT 30.5* 32.7*  PLT 131* 215   BMET:  Recent Labs    11/17/20 0035 11/18/20 0513  NA 131* 133*  K 4.8 4.4  CL 100 99  CO2 22 28  GLUCOSE 171* 210*  BUN 21* 26*  CREATININE 1.07 0.94  CALCIUM 8.3* 8.3*    PT/INR: No results for input(s): LABPROT, INR in the last 72 hours. ABG    Component Value Date/Time   PHART 7.314 (L) 11/14/2020 1954   HCO3 20.7 11/14/2020 1954   TCO2 22 11/14/2020 1954   ACIDBASEDEF 5.0 (H) 11/14/2020 1954   O2SAT 95.0 11/14/2020 1954   CBG (last 3)  Recent Labs     11/17/20 1621 11/17/20 2120 11/18/20 0606  GLUCAP 190* 169* 178*    Assessment/Plan: S/P Procedure(s) (LRB): CORONARY ARTERY BYPASS GRAFTING (CABG), ON PUMP, TIMES FOUR, USING BILATERAL INTERNAL MAMMARY ARTERIES, LEFT RADIAL ARTERY HARVESTED OPEN, AND RIGHT ENDOSCOPICALLY HARVESTED GREATER SAPHENOUS VEIN (N/A) TRANSESOPHAGEAL ECHOCARDIOGRAM (TEE) (N/A) RADIAL ARTERY HARVEST (Left) ENDOVEIN HARVEST OF GREATER SAPHENOUS VEIN (Right)  1. CV- A. Fib with RVR yesterday, brief episode overnight, in NSR currently will transition to oral Amiodarone, continue Lopressor, Imdur for radial artery graft 2. Pulm- no acute issues, using IS, weaning oxygen as tolerated 3. Renal- creatinine WNL, remains volume overloaded, but weight is trending down, K WNL 4. DM- sugars mostly controlled, will consult diabetes coordinator 5. Dispo- patient in NSR, transition to oral Amiodarone, continue diuretics, will consult diabetes educator patients A1c was 7.9, new diagnosis diabetes, patient needs to ambulate    LOS: 5 days    Lowella Dandy, PA-C 11/18/2020   I have seen and examined the patient and agree with the assessment and plan as outlined.  Purcell Nails, MD 11/18/2020 8:28 AM

## 2020-11-18 NOTE — Progress Notes (Signed)
CARDIAC REHAB PHASE I   Returned to ambulate with pt. RN getting ready to d/c EPW. Will continue to follow.  Reynold Bowen, RN BSN 11/18/2020 2:15 PM

## 2020-11-18 NOTE — Progress Notes (Signed)
CARDIAC REHAB PHASE I   Went to offer to walk with pt, pt up walking with PT. Will f/u to continue to encourage ambulation.  Reynold Bowen, RN BSN 11/18/2020 9:54 AM

## 2020-11-18 NOTE — Progress Notes (Signed)
Educated pt and wife on importance of sitting up for meals, ambulation at least three times a day and using incentive spirometer. Pt/wife receptive and plan to walk today. Educated pt on scheduled tylenol as opposed to oxy IR. Pt stated was upsetting him on empty stomach. Encouraged to take pills with crackers or applesauce. Pt resting with call bell within reach.  Will continue to monitor.

## 2020-11-18 NOTE — Progress Notes (Signed)
Mobility Specialist - Progress Note   11/18/20 1602  Mobility  Activity Ambulated in hall  Level of Assistance Minimal assist, patient does 75% or more  Assistive Device Front wheel walker  Distance Ambulated (ft) 230 ft  Mobility Response Tolerated well  Mobility performed by Mobility specialist  $Mobility charge 1 Mobility   Pre-mobility: 83 HR, 108/65 BP, 98% SpO2 Post-mobility: 87 HR, 116/67 BP, 96% SpO2  Pt min assist for bed mobility, standby during ambulation. Distance limited due to fatigue. Pt bathed by NT, then back into bed after walk. Pt left w/ RN and family in room.   Mamie Levers Mobility Specialist Mobility Specialist Phone: [phone]

## 2020-11-19 LAB — GLUCOSE, CAPILLARY
Glucose-Capillary: 131 mg/dL — ABNORMAL HIGH (ref 70–99)
Glucose-Capillary: 151 mg/dL — ABNORMAL HIGH (ref 70–99)
Glucose-Capillary: 141 mg/dL — ABNORMAL HIGH (ref 70–99)
Glucose-Capillary: 145 mg/dL — ABNORMAL HIGH (ref 70–99)

## 2020-11-19 LAB — BASIC METABOLIC PANEL
Anion gap: 11 (ref 5–15)
BUN: 19 mg/dL (ref 6–20)
CO2: 28 mmol/L (ref 22–32)
Calcium: 8.1 mg/dL — ABNORMAL LOW (ref 8.9–10.3)
Chloride: 95 mmol/L — ABNORMAL LOW (ref 98–111)
Creatinine, Ser: 0.92 mg/dL (ref 0.61–1.24)
GFR, Estimated: >60 mL/min (ref >60)
Glucose, Bld: 130 mg/dL — ABNORMAL HIGH (ref 70–99)
Potassium: 3.8 mmol/L (ref 3.5–5.1)
Sodium: 134 mmol/L — ABNORMAL LOW (ref 135–145)

## 2020-11-19 LAB — TSH: TSH: 1.479 u[IU]/mL (ref 0.350–4.500)

## 2020-11-19 LAB — MAGNESIUM: Magnesium: 2.1 mg/dL (ref 1.7–2.4)

## 2020-11-19 MED ORDER — POTASSIUM CHLORIDE CRYS ER 20 MEQ PO TBCR
40.0000 meq | EXTENDED_RELEASE_TABLET | Freq: Two times a day (BID) | ORAL | Status: AC
  Administered 2020-11-19 – 2020-11-20 (×4): 40 meq via ORAL
  Filled 2020-11-19 (×4): qty 2

## 2020-11-19 MED ORDER — MAGNESIUM SULFATE 2 GM/50ML IV SOLN
2.0000 g | Freq: Once | INTRAVENOUS | Status: AC
  Administered 2020-11-19: 2 g via INTRAVENOUS
  Filled 2020-11-19: qty 50

## 2020-11-19 MED ORDER — METOPROLOL TARTRATE 50 MG PO TABS
75.0000 mg | ORAL_TABLET | Freq: Two times a day (BID) | ORAL | Status: DC
  Administered 2020-11-19 – 2020-11-20 (×2): 75 mg via ORAL
  Filled 2020-11-19 (×2): qty 1

## 2020-11-19 NOTE — Progress Notes (Signed)
CARDIAC REHAB PHASE I   Offered to walk with pt. Pt states ambulation in room. Needing to use BR. Stressed importance of ambulation. Encouraged pt and wife to ambulate self. Will continue to follow.  Reynold Bowen, RN BSN 11/19/2020 11:34 AM

## 2020-11-19 NOTE — Progress Notes (Signed)
Mobility Specialist: Progress Note   11/19/20 1437  Mobility  Activity Ambulated in hall  Level of Assistance Minimal assist, patient does 75% or more  Assistive Device Front wheel walker  Distance Ambulated (ft) 400 ft  Mobility Response Tolerated well  Mobility performed by Mobility specialist  $Mobility charge 1 Mobility   Pre-Mobility: 124 HR, 90% SpO2 During Mobility: 146 HR Post-Mobility: 102 HR, 95% SpO2  Pt c/o feeling SOB halfway through ambulation and required a brief standing break. Pt otherwise asx. Pt back to bed after walk with wife present in room.   [facility] Valrie Jia Mobility Specialist Mobility Specialist Phone: [phone]

## 2020-11-19 NOTE — Progress Notes (Addendum)
      [address]       [place] [address]             [phone]      5 Days Post-Op Procedure(s) (LRB): CORONARY ARTERY BYPASS GRAFTING (CABG), ON PUMP, TIMES FOUR, USING BILATERAL INTERNAL MAMMARY ARTERIES, LEFT RADIAL ARTERY HARVESTED OPEN, AND RIGHT ENDOSCOPICALLY HARVESTED GREATER SAPHENOUS VEIN (N/A) TRANSESOPHAGEAL ECHOCARDIOGRAM (TEE) (N/A) RADIAL ARTERY HARVEST (Left) ENDOVEIN HARVEST OF GREATER SAPHENOUS VEIN (Right)   Subjective:  Patient up on side of bed.  Overall feeling better, off oxygen, ambulated in the hallway yesterday. + BM  Objective: Vital signs in last 24 hours: Temp:  [98 F (36.7 C)-99.9 F (37.7 C)] 98.7 F (37.1 C) (05/10 0745) Pulse Rate:  [77-91] 85 (05/10 0745) Cardiac Rhythm: Normal sinus rhythm (05/10 0751) Resp:  [9-20] 20 (05/10 0745) BP: (99-125)/(59-77) 116/59 (05/10 0745) SpO2:  [91 %-100 %] 93 % (05/10 0745) Weight:  [102.6 kg] 102.6 kg (05/10 0342)  Intake/Output from previous day: 05/09 0701 - 05/10 0700 In: 240 [P.O.:240] Out: 475 [Urine:475]  General appearance: alert, cooperative and no distress Heart: regularly irregular rhythm Lungs: clear to auscultation bilaterally Abdomen: soft, non-tender; bowel sounds normal; no masses,  no organomegaly Extremities: edema trace Wound: clean and dryu  Lab Results: Recent Labs    11/17/20 0035  WBC 17.7*  HGB 10.9*  HCT 32.7*  PLT 215   BMET:  Recent Labs    11/18/20 0513 11/19/20 0110  NA 133* 134*  K 4.4 3.8  CL 99 95*  CO2 28 28  GLUCOSE 210* 130*  BUN 26* 19  CREATININE 0.94 0.92  CALCIUM 8.3* 8.1*    PT/INR: No results for input(s): LABPROT, INR in the last 72 hours. ABG    Component Value Date/Time   PHART 7.314 (L) 11/14/2020 1954   HCO3 20.7 11/14/2020 1954   TCO2 22 11/14/2020 1954   ACIDBASEDEF 5.0 (H) 11/14/2020 1954   O2SAT 95.0 11/14/2020 1954   CBG (last 3)  Recent Labs    11/18/20 1605 11/18/20 2108 11/19/20 0624   GLUCAP 152* 149* 131*    Assessment/Plan: S/P Procedure(s) (LRB): CORONARY ARTERY BYPASS GRAFTING (CABG), ON PUMP, TIMES FOUR, USING BILATERAL INTERNAL MAMMARY ARTERIES, LEFT RADIAL ARTERY HARVESTED OPEN, AND RIGHT ENDOSCOPICALLY HARVESTED GREATER SAPHENOUS VEIN (N/A) TRANSESOPHAGEAL ECHOCARDIOGRAM (TEE) (N/A) RADIAL ARTERY HARVEST (Left) ENDOVEIN HARVEST OF GREATER SAPHENOUS VEIN (Right)  1. CV- A. Fib/flutter this morning- on Amiodarone 400 mg BID, increase Lopressor to 75 mg BID, on Imdur for radial artery.. will may need start NOAC, check TSH 2. Pulm- no acute issues, off oxygen, continue IS 3. Renal- creatinine, weight is at baseline, on lasix BID, potassium okay.. will give a dose of Mg 4. DM- sugars controlled, patient doesn't want Metformin, would like glyburide if possible 5. Dispo- patient in A. Flutter/Fib- increase Lopressor, may need NOAC, check TSH, continue diuretics as ordered, cbgs controlled, will need to get meds finalized prior to discharge   LOS: 6 days    Lowella Dandy, PA-C 11/19/2020   I have seen and examined the patient and agree with the assessment and plan as outlined.  Purcell Nails, MD 11/19/2020 8:36 AM

## 2020-11-19 NOTE — Progress Notes (Signed)
Inpatient Diabetes Program Recommendations  AACE/ADA: New Consensus Statement on Inpatient Glycemic Control (2015)  Target Ranges:  Prepandial:   less than 140 mg/dL      Peak postprandial:   less than 180 mg/dL (1-2 hours)      Critically ill patients:  140 - 180 mg/dL   Lab Results  Component Value Date   GLUCAP 151 (H) 11/19/2020   HGBA1C 7.9 (H) 11/13/2020    Review of Glycemic Control  - LATE Entry  Diabetes history: Pre-diabetes Outpatient Diabetes medications: None Current orders for Inpatient glycemic control: Levemir 20 units QHS, Novolog 0-15 units TID with meals  HgbA1C - 7.9% 130-197 mg/dL over past 16X.  Inpatient Diabetes Program Recommendations:     For home: Metformin 500 mg BID Lifestyle modification with diet, exercise and stress management  Spoke with pt at length about new diagnosis, HgbA1C of 7.9% Spoke with patient about new diabetes diagnosis.  Discussed A1C results  and explained what an A1C is and informed patient that his current A1C indicates an average glucose of 240 mg/dl over the past 2-3 months. Discussed basic pathophysiology of DM Type 2, basic home care, importance of checking CBGs and maintaining good CBG control to prevent long-term and short-term complications. Reviewed glucose and A1C goals and explained that patient will need to continue to monitor blood sugars daily.  Reviewed signs and symptoms of hyperglycemia and hypoglycemia along with treatment for both. Discussed impact of nutrition, exercise, stress, sickness, and medications on diabetes control. Reviewed Living Well with diabetes booklet and encouraged patient to read through entire book. Informed patient that he will be prescribed Novolin 70/30 since it is more affordable. IAsked patient to check his glucose 4 times per day (before meals and at bedtime) and to keep a log book of glucose readings and insulin taken. Explained how the doctor he follows up with can use the log book to  continue to make insulin adjustments if needed.    Patient verbalized understanding of information discussed and he states that he has no further questions at this time related to diabetes.   RNs to provide ongoing basic DM education at bedside with this patient and engage patient to actively check blood glucose and administer insulin injections.   Thank you. Ailene Ards, RD, LDN, CDE Inpatient Diabetes Coordinator [phone]

## 2020-11-19 NOTE — Evaluation (Signed)
Occupational Therapy Evaluation Patient Details Name: Gerald Andrade MRN: [id number] DOB: 1970-12-07 Today's Date: 11/19/2020    History of Present Illness Pt is a 50 year old male with PMH of HTN and COPD. He presented to the ED for evaluation of chest pain. 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   Clinical Impression   Pt PTA: Pt independent with ADL and mobility. Pt currently, pt minA overall for ADL and minguardA with mobility using RW ambulating ~125'. Pt limited by decreased activity tolerance, decreaase strength, and decreased ability to care for self. Pt with moderate pain from incision. SpO2  >90% on RA max HR 153 BPM with exertion Pt would benefit from continued OT skilled services for ADL and energy conservation. OT following acutely.    Follow Up Recommendations  No OT follow up    Equipment Recommendations  3 in 1 bedside commode;Tub/shower seat    Recommendations for Other Services       Precautions / Restrictions Precautions Precautions: Sternal Precaution Comments: educated/reviewed sternal precautions Restrictions Weight Bearing Restrictions: Yes Other Position/Activity Restrictions: no push/pull      Mobility Bed Mobility Overal bed mobility: Modified Independent             General bed mobility comments: Increased time and able to perform log roll    Transfers Overall transfer level: Needs assistance Equipment used: Rolling walker (2 wheeled) Transfers: Sit to/from Stand Sit to Stand: Min assist         General transfer comment: assist to power up, cues for hand placement/precautions    Balance Overall balance assessment: Mild deficits observed, not formally tested                                         ADL either performed or assessed with clinical judgement   ADL Overall ADL's : Needs assistance/impaired Eating/Feeding: Set up;Sitting   Grooming:  Supervision/safety;Standing   Upper Body Bathing: Supervision/ safety;Standing   Lower Body Bathing: Minimal assistance;Sitting/lateral leans;Sit to/from stand;Cueing for safety   Upper Body Dressing : Supervision/safety;Sitting;Standing;Cueing for safety   Lower Body Dressing: Minimal assistance;Cueing for safety;Cueing for sequencing;Sitting/lateral leans;Sit to/from stand   Toilet Transfer: Supervision/safety;Ambulation   Toileting- Clothing Manipulation and Hygiene: Minimal assistance;Cueing for safety;Sitting/lateral lean;Sit to/from stand       Functional mobility during ADLs: Min guard;Rolling walker;Cueing for safety General ADL Comments: Pt limited by decreased activity tolerance, decreaase strength, and decreased ability to care for self. Pt with moderate pain from incision.     Vision Baseline Vision/History: No visual deficits Patient Visual Report: No change from baseline Vision Assessment?: No apparent visual deficits     Perception     Praxis      Pertinent Vitals/Pain Pain Assessment: Faces Faces Pain Scale: Hurts little more Pain Location: sternal incision Pain Descriptors / Indicators: Grimacing;Guarding Pain Intervention(s): Monitored during session;Premedicated before session     Hand Dominance Right   Extremity/Trunk Assessment Upper Extremity Assessment Upper Extremity Assessment: Generalized weakness   Lower Extremity Assessment Lower Extremity Assessment: Generalized weakness   Cervical / Trunk Assessment Cervical / Trunk Assessment: Normal   Communication Communication Communication: No difficulties   Cognition Arousal/Alertness: Lethargic Behavior During Therapy: Flat affect Overall Cognitive Status: Within Functional Limits for tasks assessed  General Comments  SpO2  >90% on RA max HR 153 BPM with exertion    Exercises     Shoulder Instructions      Home Living Family/patient  expects to be discharged to:: Private residence Living Arrangements: Spouse/significant other;Parent Available Help at Discharge: Family;Available 24 hours/day Type of Home: House Home Access: Ramped entrance     Home Layout: Able to live on main level with bedroom/bathroom     Bathroom Shower/Tub: Chief Strategy Officer: Standard     Home Equipment: None          Prior Functioning/Environment Level of Independence: Independent        Comments: works as a Biomedical scientist List: Decreased activity tolerance;Impaired balance (sitting and/or standing);Pain;Increased edema;Cardiopulmonary status limiting activity;Decreased safety awareness      OT Treatment/Interventions: Self-care/ADL training;Therapeutic exercise;Energy conservation;DME and/or AE instruction;Therapeutic activities;Patient/family education;Balance training    OT Goals(Current goals can be found in the care plan section) Acute Rehab OT Goals Patient Stated Goal: home OT Goal Formulation: With patient Time For Goal Achievement: 12/03/20 Potential to Achieve Goals: Good ADL Goals Pt Will Perform Lower Body Dressing: with supervision;sit to/from stand Pt Will Perform Toileting - Clothing Manipulation and hygiene: with supervision;sitting/lateral leans;sit to/from stand Pt Will Perform Tub/Shower Transfer: Shower transfer;with supervision;ambulating Additional ADL Goal #1: Pt will state 3 energy conservation strategies in order to increase independence with ADL and mobility.  OT Frequency: Min 2X/week   Barriers to D/C:            Co-evaluation              AM-PAC OT "6 Clicks" Daily Activity     Outcome Measure Help from another person eating meals?: None Help from another person taking care of personal grooming?: A Little Help from another person toileting, which includes using toliet, bedpan, or urinal?: A Little Help from another person bathing (including washing,  rinsing, drying)?: A Little Help from another person to put on and taking off regular upper body clothing?: A Little Help from another person to put on and taking off regular lower body clothing?: A Little 6 Click Score: 19   End of Session Equipment Utilized During Treatment: Rolling walker Nurse Communication: Mobility status  Activity Tolerance: Patient tolerated treatment well;Patient limited by pain Patient left: Other (comment) (up with NT in room)  OT Visit Diagnosis: Unsteadiness on feet (R26.81);Muscle weakness (generalized) (M62.81);Pain                Time: 6160-7371 OT Time Calculation (min): 17 min Charges:  OT General Charges $OT Visit: 1 Visit OT Evaluation $OT Eval Moderate Complexity: 1 Mod  Flora Lipps, OTR/L Acute Rehabilitation Services Pager: [phone] Office: [phone]   Lucindia Lemley C 11/19/2020, 5:54 PM

## 2020-11-20 ENCOUNTER — Other Ambulatory Visit (HOSPITAL_COMMUNITY): Payer: Self-pay

## 2020-11-20 LAB — GLUCOSE, CAPILLARY
Glucose-Capillary: 138 mg/dL — ABNORMAL HIGH (ref 70–99)
Glucose-Capillary: 143 mg/dL — ABNORMAL HIGH (ref 70–99)
Glucose-Capillary: 188 mg/dL — ABNORMAL HIGH (ref 70–99)
Glucose-Capillary: 198 mg/dL — ABNORMAL HIGH (ref 70–99)

## 2020-11-20 MED ORDER — IRBESARTAN 150 MG PO TABS: 150.0000 mg | ORAL_TABLET | Freq: Every day | ORAL | Status: DC

## 2020-11-20 MED ORDER — APIXABAN 5 MG PO TABS
5.0000 mg | ORAL_TABLET | Freq: Two times a day (BID) | ORAL | Status: DC
  Administered 2020-11-20 – 2020-11-22 (×5): 5 mg via ORAL
  Filled 2020-11-20 (×5): qty 1

## 2020-11-20 MED ORDER — DILTIAZEM HCL ER COATED BEADS 120 MG PO CP24
120.0000 mg | ORAL_CAPSULE | Freq: Every day | ORAL | Status: DC
  Administered 2020-11-20 – 2020-11-22 (×3): 120 mg via ORAL
  Filled 2020-11-20 (×3): qty 1

## 2020-11-20 MED ORDER — DIAZEPAM 5 MG PO TABS
10.0000 mg | ORAL_TABLET | Freq: Every day | ORAL | Status: DC
  Administered 2020-11-20 – 2020-11-21 (×2): 10 mg via ORAL
  Filled 2020-11-20 (×2): qty 2

## 2020-11-20 MED ORDER — GLIPIZIDE 10 MG PO TABS
10.0000 mg | ORAL_TABLET | Freq: Two times a day (BID) | ORAL | Status: DC
  Administered 2020-11-20 – 2020-11-22 (×3): 10 mg via ORAL
  Filled 2020-11-20 (×3): qty 1

## 2020-11-20 MED ORDER — METOPROLOL TARTRATE 100 MG PO TABS
100.0000 mg | ORAL_TABLET | Freq: Two times a day (BID) | ORAL | Status: DC
  Administered 2020-11-20 – 2020-11-22 (×4): 100 mg via ORAL
  Filled 2020-11-20 (×4): qty 1

## 2020-11-20 NOTE — Discharge Instructions (Addendum)
Discharge Instructions:  1. You may shower, please wash incisions daily with soap and water and keep dry.  If you wish to cover wounds with dressing you may do so but please keep clean and change daily.  No tub baths or swimming until incisions have completely healed.  If your incisions become red or develop any drainage please call our office at [phone]  2. No Driving until cleared by Dr. Orvan July office and you are no longer using narcotic pain medications  3. Monitor your weight daily.. Please use the same scale and weigh at same time... If you gain 5-10 lbs in 48 hours with associated lower extremity swelling, please contact our office at [phone]  4. Fever of 101.5 for at least 24 hours with no source, please contact our office at [phone]  5. Activity- up as tolerated, please walk at least 3 times per day.  Avoid strenuous activity, no lifting, pushing, or pulling with your arms over 8-10 lbs for a minimum of 6 weeks  6. If any questions or concerns arise, please do not hesitate to contact our office at [phone]     Information on my medicine - ELIQUIS (apixaban) Why was Eliquis prescribed for you? Eliquis was prescribed for you to reduce the risk of a blood clot forming that can cause a stroke if you have a medical condition called atrial fibrillation (a type of irregular heartbeat).  What do You need to know about Eliquis ? Take your Eliquis TWICE DAILY - one tablet in the morning and one tablet in the evening with or without food. If you have difficulty swallowing the tablet whole please discuss with your pharmacist how to take the medication safely.  Take Eliquis exactly as prescribed by your doctor and DO NOT stop taking Eliquis without talking to the doctor who prescribed the medication.  Stopping may increase your risk of developing a stroke.  Refill your prescription before you run out.  After discharge, you should have regular check-up appointments with  your healthcare provider that is prescribing your Eliquis.  In the future your dose may need to be changed if your kidney function or weight changes by a significant amount or as you get older.  What do you do if you miss a dose? If you miss a dose, take it as soon as you remember on the same day and resume taking twice daily.  Do not take more than one dose of ELIQUIS at the same time to make up a missed dose.  Important Safety Information A possible side effect of Eliquis is bleeding. You should call your healthcare provider right away if you experience any of the following: ? Bleeding from an injury or your nose that does not stop. ? Unusual colored urine (red or dark brown) or unusual colored stools (red or black). ? Unusual bruising for unknown reasons. ? A serious fall or if you hit your head (even if there is no bleeding).  Some medicines may interact with Eliquis and might increase your risk of bleeding or clotting while on Eliquis. To help avoid this, consult your healthcare provider or pharmacist prior to using any new prescription or non-prescription medications, including herbals, vitamins, non-steroidal anti-inflammatory drugs (NSAIDs) and supplements.  This website has more information on Eliquis (apixaban): http://www.eliquis.com/eliquis/home

## 2020-11-20 NOTE — Progress Notes (Addendum)
[address]       [place] [address]             [phone]      6 Days Post-Op Procedure(s) (LRB): CORONARY ARTERY BYPASS GRAFTING (CABG), ON PUMP, TIMES FOUR, USING BILATERAL INTERNAL MAMMARY ARTERIES, LEFT RADIAL ARTERY HARVESTED OPEN, AND RIGHT ENDOSCOPICALLY HARVESTED GREATER SAPHENOUS VEIN (N/A) TRANSESOPHAGEAL ECHOCARDIOGRAM (TEE) (N/A) RADIAL ARTERY HARVEST (Left) ENDOVEIN HARVEST OF GREATER SAPHENOUS VEIN (Right)   Subjective:  Patient states he had a rough night.  He states that he had pain and couldn't get to sleep.  He states by the time he got his medication it was after 5 am before he got to sleep.  He usually takes valium nightly at bedtime.  Objective: Vital signs in last 24 hours: Temp:  [99.5 F (37.5 C)-100 F (37.8 C)] 99.5 F (37.5 C) (05/11 0339) Pulse Rate:  [79-107] 88 (05/11 0339) Cardiac Rhythm: Atrial fibrillation (05/11 0729) Resp:  [18-21] 18 (05/11 0339) BP: (104-151)/(68-88) 151/76 (05/11 0339) SpO2:  [93 %-98 %] 96 % (05/11 0339) Weight:  [98.9 kg] 98.9 kg (05/11 0452)  Intake/Output from previous day: 05/10 0701 - 05/11 0700 In: 240 [P.O.:240] Out: 250 [Urine:250]  General appearance: alert, cooperative and no distress Heart: irregularly irregular rhythm Lungs: clear to auscultation bilaterally Abdomen: soft, non-tender; bowel sounds normal; no masses,  no organomegaly Extremities: edema trace Wound: clean and dry  Lab Results: No results for input(s): WBC, HGB, HCT, PLT in the last 72 hours. BMET: Recent Labs    11/18/20 0513 11/19/20 0110  NA 133* 134*  K 4.4 3.8  CL 99 95*  CO2 28 28  GLUCOSE 210* 130*  BUN 26* 19  CREATININE 0.94 0.92  CALCIUM 8.3* 8.1*    PT/INR: No results for input(s): LABPROT, INR in the last 72 hours. ABG    Component Value Date/Time   PHART 7.314 (L) 11/14/2020 1954   HCO3 20.7 11/14/2020 1954   TCO2 22 11/14/2020 1954   ACIDBASEDEF 5.0 (H) 11/14/2020 1954   O2SAT  95.0 11/14/2020 1954   CBG (last 3)  Recent Labs    11/19/20 1638 11/19/20 2111 11/20/20 0600  GLUCAP 141* 145* 188*    Assessment/Plan: S/P Procedure(s) (LRB): CORONARY ARTERY BYPASS GRAFTING (CABG), ON PUMP, TIMES FOUR, USING BILATERAL INTERNAL MAMMARY ARTERIES, LEFT RADIAL ARTERY HARVESTED OPEN, AND RIGHT ENDOSCOPICALLY HARVESTED GREATER SAPHENOUS VEIN (N/A) TRANSESOPHAGEAL ECHOCARDIOGRAM (TEE) (N/A) RADIAL ARTERY HARVEST (Left) ENDOVEIN HARVEST OF GREATER SAPHENOUS VEIN (Right)  1. CV- PAF, rates in the 130s this morning- continue Amiodarone, Lopressor, will add Cardizem CD as discussed with Dr. Cornelius Moras... start Eliquis, TSH was WNL 2. Pulm- off oxygen, no acute issues,  3. Renal- weight is below admission, Lasix regimen completed, K was 3.8 yesterday, he was supplemented, repeat BMET in AM 4. DM- sugars are controlled, diabetes coordinator spoke with patient yesterday, note recs metformin for discharge, however patient is unwilling to take this medication, I reached out yesterday, but didn't receive a call back... will attempt again today to get new medication recommendations 5. Dispo- patient with PAF overnight, in A. Fib this morning- will add Cardizem for additional HR coverage, follow up with Diabetes coordinator for medication recs.. patients HR needs to be under better control prior to discharge, will arrange follow up in A. Fib clinic as discussed with Dr. Cornelius Moras   LOS: 7 days    Lowella Dandy, PA-C 11/20/2020   I have seen and examined the  patient and agree with the assessment and plan as outlined.  Still going in and out of Afib/Aflutter with elevated HR.  Will increase metoprolol further and add Cardizem, continue amiodarone.  Stop Plavix and start Eliquis.  I will be out of town remainder of this week, my partners will cover.  I expect that it will be at least another day or two before patient is ready for discharge home.  Will plan to arrange for f/u in Afib Clinic after  hospital discharge.  Purcell Nails, MD 11/20/2020 8:24 AM

## 2020-11-20 NOTE — Progress Notes (Signed)
Inpatient Diabetes Program Recommendations  AACE/ADA: New Consensus Statement on Inpatient Glycemic Control (2015)  Target Ranges:  Prepandial:   less than 140 mg/dL      Peak postprandial:   less than 180 mg/dL (1-2 hours)      Critically ill patients:  140 - 180 mg/dL   Lab Results  Component Value Date   GLUCAP 188 (H) 11/20/2020   HGBA1C 7.9 (H) 11/13/2020    Review of Glycemic Control  CBG 188 mg/dL this am  Spoke with pt late yesterday afternoon to see if he had any questions after reading Living Well with Diabetes book. Pt was very drowsy and did not talk very much. I spoke with wife who was at bedside.  Discussed HgbA1C of 7.9% and importance of reducing it to <7.0%. Wife states pt does not want to be on metformin after reading about it. Pt has no insurance and requested to be on glipizide. Willing to make major changes with diet, exercise and stress management. Discussed importance of checking blood sugars at various times throughout the day and taking logbook to PCP for review.   For discharge: Recommendations  Glipizide 10 mg BID Blood glucose meter (#[id number])  Monitor 2x/day, varying the times F/U with PCP within a week or two Exercise daily Eat healthy variety of foods, focusing on low sugar, high fiber, eliminating sodas, sweet tea  Discussed hypoglycemia s/s and treatment.  Continue to follow.  Thank you. Ailene Ards, RD, LDN, CDE Inpatient Diabetes Coordinator [phone]

## 2020-11-20 NOTE — Progress Notes (Signed)
PT Cancellation Note  Patient Details Name: Gerald Andrade MRN: [id number] DOB: Jan 10, 1971   Cancelled Treatment:    Reason Eval/Treat Not Completed: (P) Other (comment) (per chart review pt did not sleep until 5am so did not attempt until afternoon. Pt set up to eat dinner when therapist entered room and pt spouse asking therapist to try again later.) Will continue efforts per PT POC as schedule permits.   Dorathy Kinsman Kiah Keay 11/20/2020, 3:19 PM

## 2020-11-20 NOTE — Progress Notes (Signed)
Mobility Specialist - Progress Note   11/20/20 1628  Mobility  Activity Ambulated in hall  Level of Assistance Minimal assist, patient does 75% or more  Assistive Device Front wheel walker  Distance Ambulated (ft) 470 ft  Mobility Ambulated with assistance in hallway  Mobility Response Tolerated well  Mobility performed by Mobility specialist  $Mobility charge 1 Mobility   Pt agreeable to ambulation after some motivation, he was min assist to sit up on edge of bed. He was asx throughout, VSS. Pt sitting up on edge of bed after walk, family in room.    Mamie Levers Mobility Specialist Mobility Specialist Phone: [phone]

## 2020-11-20 NOTE — Progress Notes (Signed)
ANTICOAGULATION CONSULT NOTE - Initial Consult  Pharmacy Consult for apixaban  Indication: atrial fibrillation  Allergies  Allergen Reactions  . Shrimp [Shellfish Allergy] Swelling    Chest tighten     Patient Measurements: Height: 6' (182.9 cm) Weight: 98.9 kg (218 lb 1.6 oz) IBW/kg (Calculated) : 77.6  Vital Signs: Temp: 99.5 F (37.5 C) (05/11 0339) Temp Source: Oral (05/11 0339) BP: 151/76 (05/11 0339) Pulse Rate: 88 (05/11 0339)  Labs: Recent Labs    11/18/20 0513 11/19/20 0110  CREATININE 0.94 0.92    Estimated Creatinine Clearance: 117 mL/min (by C-G formula based on SCr of 0.92 mg/dL).   Medical History: Past Medical History:  Diagnosis Date  . Coronary artery disease    . Fatty liver   . 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 Type II diabetes mellitus (HCC) 11/13/2020    Assessment: 50 year old male sp CABG now noted to have post-op afib/flutter. Patient started on amiodarone, will start apixaban today. Hgb had been stable on last few checks post op in 10s, plt count wnl. No bleeding issues noted.  Goal of Therapy:  Monitor platelets by anticoagulation protocol: Yes   Plan:  Apixaban 5mg  bid Check CBC in am  Initially no insurance found on benefits check - will check with his primary pharmacy Will provide education prior to discharge  PharmD., BCPS Clinical Pharmacist 11/20/2020 7:47 AM

## 2020-11-20 NOTE — Progress Notes (Signed)
CARDIAC REHAB PHASE I   PRE:  Rate/Rhythm: 82 SR  BP:  Sitting: 98/67       SaO2: 94 RA  MODE:  Ambulation: 470 ft   POST:  Rate/Rhythm: 96 SR  BP:  Sitting: 128/74    SaO2: 96 RA  Pt ambulated 440ft in hallway standby assist with steady gait. Pt denies CP, SOB, or dizziness throughout walk. Pt returned to EOB for bath. Encouraged continued ambulation and IS use. Poss d/c Fri. Will continue to follow.  [id number] Reynold Bowen, RN BSN 11/20/2020 11:25 AM

## 2020-11-20 NOTE — TOC Initial Note (Addendum)
Transition of Care (TOC) - Initial/Assessment Note  Donn Pierini RN, BSN Transitions of Care Unit 4E- RN Case Manager See Treatment Team for direct phone #    Patient Details  Name: Gerald Andrade MRN: [id number] Date of Birth: [birth year]  Transition of Care [facility]) CM/SW Contact:    Darrold Span, RN Phone Number: 11/20/2020, 12:07 PM  Clinical Narrative:                 Pt s/p CABG with post op Afib. Pt has PCP showing as Dr Jeannetta Nap.  Noted plan for Eliquis- pt is uninsured- will need to have [facility] pharmacy fill scripts here for discharge in order to assist pt with medications.  Orders placed for DME and HH needs.  Call made to [employer] for DME referral- RW to be delivered to room prior to discharge.   Call made to [facility] with [facility] for HHPT referral- [facility] will speak with pt to see if he is eligible for charity [facility] services program.   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   Anticipate home on Friday, plan to return home with spouse.    Expected Discharge Plan: Home w Home Health Services Barriers to Discharge: Continued Medical Work up   Patient Goals and CMS Choice Patient states their goals for this hospitalization and ongoing recovery are:: return home   Choice offered to / list presented to : NA [facility] referral)  Expected Discharge Plan and Services Expected Discharge Plan: Home w Home Health Services In-house Referral: Clinical Social Work Discharge Planning Services: CM Consult Post Acute Care Choice: Durable Medical Equipment,Home Health Living arrangements for the past 2 months: Single Family Home                 DME Arranged: 3-N-1,Walker rolling DME Agency: [facility] Date DME Agency Contacted: 11/20/20 Time DME Agency Contacted: 1100 Representative spoke with at DME Agency: Duwayne Heck HH Arranged:  PT HH Agency: [facility] Date HH Agency Contacted: 11/20/20 Time HH Agency Contacted: 1206 Representative spoke with at [facility] Agency: Pearson Grippe  Prior Living Arrangements/Services Living arrangements for the past 2 months: Single Family Home Lives with:: Self,Parents, spouse Patient language and need for interpreter reviewed:: Yes Do you feel safe going back to the place where you live?: Yes      Need for Family Participation in Patient Care: Yes (Comment) Care giver support system in place?: Yes (comment)   Criminal Activity/Legal Involvement Pertinent to Current Situation/Hospitalization: No - Comment as needed  Activities of Daily Living Home Assistive Devices/Equipment: None ADL Screening (condition at time of admission) Patient's cognitive ability adequate to safely complete daily activities?: No Is the patient deaf or have difficulty hearing?: No Does the patient have difficulty seeing, even when wearing glasses/contacts?: No Does the patient have difficulty concentrating, remembering, or making decisions?: No Patient able to express need for assistance with ADLs?: Yes Does the patient have difficulty dressing or bathing?: No Independently performs ADLs?: Yes (appropriate for developmental age) Does the patient have difficulty walking or climbing stairs?: No Weakness of Legs: None Weakness of Arms/Hands: None  Permission Sought/Granted                  Emotional Assessment Appearance:: Appears stated age     Orientation: : Oriented to Self,Oriented to Place,Oriented to  Time,Oriented to Situation  Alcohol / Substance Use: Not Applicable Psych Involvement: No (comment)  Admission diagnosis:  Unstable angina (HCC) [I20.0] ACS (acute coronary syndrome) (HCC) [I24.9] Patient Active Problem List   Diagnosis Date Noted  . 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 Type II diabetes mellitus (HCC) 11/13/2020  . ACS (acute coronary syndrome) (HCC) 11/12/2020  .  Unstable angina (HCC)   . Coronary artery disease     PCP:  Kaleen Mask, MD Pharmacy:   [facility] [phone] - [address], [place] - [address] [place] [address] Phone: [phone] Fax: [phone]  [facility] # [address] [place] - [address] [place] [address] Phone: [phone] Fax: [phone]     Social Determinants of Health (SDOH) Interventions    Readmission Risk Interventions No flowsheet data found.

## 2020-11-21 LAB — CBC
HCT: 31.5 % — ABNORMAL LOW (ref 39.0–52.0)
Hemoglobin: 10.4 g/dL — ABNORMAL LOW (ref 13.0–17.0)
MCH: 29.8 pg (ref 26.0–34.0)
MCHC: 33 g/dL (ref 30.0–36.0)
MCV: 90.3 fL (ref 80.0–100.0)
Platelets: 348 10*3/uL (ref 150–400)
RBC: 3.49 MIL/uL — ABNORMAL LOW (ref 4.22–5.81)
RDW: 13.3 % (ref 11.5–15.5)
WBC: 10.9 10*3/uL — ABNORMAL HIGH (ref 4.0–10.5)
nRBC: 0 % (ref 0.0–0.2)

## 2020-11-21 LAB — GLUCOSE, CAPILLARY
Glucose-Capillary: 121 mg/dL — ABNORMAL HIGH (ref 70–99)
Glucose-Capillary: 109 mg/dL — ABNORMAL HIGH (ref 70–99)
Glucose-Capillary: 74 mg/dL (ref 70–99)

## 2020-11-21 LAB — BASIC METABOLIC PANEL
Anion gap: 8 (ref 5–15)
BUN: 13 mg/dL (ref 6–20)
CO2: 23 mmol/L (ref 22–32)
Calcium: 8.6 mg/dL — ABNORMAL LOW (ref 8.9–10.3)
Chloride: 101 mmol/L (ref 98–111)
Creatinine, Ser: 0.85 mg/dL (ref 0.61–1.24)
GFR, Estimated: >60 mL/min (ref >60)
Glucose, Bld: 106 mg/dL — ABNORMAL HIGH (ref 70–99)
Potassium: 4.6 mmol/L (ref 3.5–5.1)
Sodium: 132 mmol/L — ABNORMAL LOW (ref 135–145)

## 2020-11-21 LAB — MAGNESIUM: Magnesium: 2.2 mg/dL (ref 1.7–2.4)

## 2020-11-21 MED ORDER — FUROSEMIDE 40 MG PO TABS
40.0000 mg | ORAL_TABLET | Freq: Every day | ORAL | Status: DC
  Administered 2020-11-21 – 2020-11-22 (×2): 40 mg via ORAL
  Filled 2020-11-21 (×2): qty 1

## 2020-11-21 MED ORDER — POTASSIUM CHLORIDE CRYS ER 20 MEQ PO TBCR
20.0000 meq | EXTENDED_RELEASE_TABLET | Freq: Every day | ORAL | Status: DC
  Administered 2020-11-21 – 2020-11-22 (×2): 20 meq via ORAL
  Filled 2020-11-21 (×2): qty 1

## 2020-11-21 NOTE — TOC Progression Note (Signed)
Transition of Care (TOC) - Progression Note  Sander Radon, BSN Transitions of Care Unit 4E- RN Case Manager See Treatment Team for direct phone #    Patient Details  Name: Gerald Andrade MRN: [id number] Date of Birth: 1971/07/05  Transition of Care [facility]) CM/SW Contact  Zenda Alpers Lenn Sink, RN Phone Number: 11/21/2020, 3:12 PM  Clinical Narrative:    Follow up done with pt and wife for transition of care needs, potential d/c tomorrow. Pt requesting f/u appointment with [facility] - pt has PCP-Dr. Jeannetta Nap pt states he would prefer a PCP within the [facility] network. Call made to [facility] and f/u appointment made for June 22 at 2:30 with [facility]- pt has been placed on the wait list should a cancellation become available. The eligibility office with [facility] will also contact pt to review for potential assistance.  Info provided to pt and wife.   Pt also to d/c on Eliquis- TOC can fill first 30 day free on discharge- pt would need to apply for pt assistance program- application has been provided to pt/wife. (need provider to fill out provider section of application- wife has at bedside) Wife will fax form once completed.   RW has been delivered to room for pt to take home per [facility].   Referral for [facility] to [facility] completed however pt did not qualify for the charity program for HHPT. Pt and wife aware and will be provided with some exercise handouts by PT here to take home.   Also discussed with pt and wife regarding bill and possible programs to assist- call made to [facility] who will refer pt to [facility] to have them f/u with pt on his eligibility for assistance.   TOC to f/u tomorrow for any further transition needs- plan for meds to be sent to [facility] pharmacy for any medication assistance pt may need.    Expected Discharge Plan: Home w Home Health Services Barriers to Discharge: Continued Medical Work up  Expected Discharge Plan and Services Expected Discharge Plan: Home w Home  Health Services In-house Referral: [facility] Discharge Planning Services: [facility],Follow-up appt scheduled,Medication Assistance Post Acute Care Choice: Durable Medical Equipment,Home Health Living arrangements for the past 2 months: Single Family Home                 DME Arranged: 3-N-1,Walker rolling DME Agency: [facility] Date DME Agency Contacted: 11/20/20 Time DME Agency Contacted: 1100 Representative spoke with at DME Agency: Duwayne Heck HH Arranged: PT HH Agency: [facility] Date HH Agency Contacted: 11/20/20 Time HH Agency Contacted: 1206 Representative spoke with at [facility] Agency: Pearson Grippe   Social Determinants of Health (SDOH) Interventions    Readmission Risk Interventions No flowsheet data found.

## 2020-11-21 NOTE — Discharge Summary (Signed)
[address]       [place] [address]             [phone]    Physician Discharge Summary  Patient ID: Gerald Andrade MRN: [id number] DOB/AGE: [birth year] 50 y.o.  Admit date: 11/12/2020 Discharge date: 11/22/2020  Admission Diagnoses:  Patient Active Problem List   Diagnosis Date Noted  . Type II diabetes mellitus (HCC) 11/13/2020  . ACS (acute coronary syndrome) (HCC) 11/12/2020  . Unstable angina (HCC)   . Coronary artery disease     Discharge Diagnoses:  Patient Active Problem List   Diagnosis Date Noted  . 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 Type II diabetes mellitus (HCC) 11/13/2020  . ACS (acute coronary syndrome) (HCC) 11/12/2020  . Unstable angina (HCC)   . Coronary artery disease     Discharged Condition: good  History of Present Illness:  Patient is a 50 year old mildly obese male with no previous history of coronary artery disease but risk factors notable for history of hypertension and a strong family history of coronary artery disease.  He presents with greater than 49-month history of exertional shortness of breath and sudden onset substernal chest pain consistent with unstable angina pectoris.  He presented for evaluation on 5/4 after experiencing chest pain (middle of chest and did not radiate) that began at 1:30 am.  According to patient, he had mowed 4 yards Monday and tried to rest after that.  After the initial chest pain began, he took 2 puffs of Albuterol as he thought the cause might be from pollen, which did not really help. He then took ec asa 325 mg, which again, did not alleviate the chest pain. Finally, he took two 5 mg Valium and asked his wife to call [phone] as chest pain continued.  Workup in the ED consisted of:  EKG showed subtle ST elevation in lead V2 with minimal ST depression in lead II and III. His chest pain did improve with Nitroglycerin. Troponin I (high sensitivity) highest has been 20. CT scan of the chest was negative  for PE but did show multifocal coronary calcification. Echo showed LVEF 55-60% and no significant valvular disease. He then underwent a cardiac catheterization which showed LVEF 45%, proximal LAD to Mid LAD lesion is 100% stenosed, 1st Diag lesion is 90% stenosed, proximal Circumflex to mid Circumflx lesion is 100% stenosed, and proximal RCA to Mid RCA lesion is 100% stenosed. A cardiothoracic consultation has been requested with Dr. Cornelius Moras who ultimately felt the patient would benefit from coronary artery bypass grafting procedure.  The risks and benefits of the procedure were explained to the patient and he was agreeable to proceed.  Hospital Course:  The patient had no further chest pain prior to surgery.  He was taken to the operating room on 11/14/2020.  He underwent CABG x 4 utilizing LIMA to LAD, SVG to Diagonal, Radial Artery to OM 2, and Free RIMA to PDA.  He underwent open harvest of left radial artery and endoscopic harvest of greater saphenous vein from his right thigh to below his knee.  He tolerated the procedure without difficulty and was taken to the SICU in stable condition.  The patient was extubated the evening of surgery without difficulty.  During his stay in the SICU the patient was weaned off Neo-synephrine as hemodynamics allowed.  He was started on Imdur for his radial artery graft.  He chest tubes and arterial lines were removed without difficulty.  He had quite a bit of post operative pain and was treated with Toradol with good response.  He was in NSR and transferred to the progressive care unit on 11/16/2020.  He developed Atrial Fibrillation with RVR.  He was treated with IV Amiodarone drip for this.  He converted to NSR.  He was having difficulty with anxiety.  He was started on prn xanax for this.  He is a newly diagnosed diabetic.  His A1c was 7.9 on admission.  Diabetes education/coordinator were consulted.  They recommended starting Metformin at discharge.  However, patient states he  has researched this and is unwilling to take this medication.  Repeat follow up with Coordinator it was recommended the patient start Glipizide 10 mg BID.  He remains in NSR.  His pacing wires were removed without difficulty.  He was diuresed for volume overload state.   Unfortunately the patient continued to experience PAF.  His Imdur was discontinued to allow titration of his Lopressor dose.  He was also initiated on Cardizem CD for additional HR control.  Finally he was started on Eliquis for stroke prophylaxis.  The patient converted to NSR.  His blood pressure was well controlled.  His blood sugars remain controlled with Glipizide.  He is ambulating without difficulty.  His surgical incisions are healing without evidence of infection.  He is medically stable for discharge home today.   Consults: diabetes coordinator  Significant Diagnostic Studies: angiography:   Left Heart Catheterization 11/12/20:  LV: Mild LV systolic dysfunction, EF 45% with inferior akinesis.  No significant mitral regurgitation.  Normal EDP.  No pressure gradient across the aortic valve. LM: Large-caliber vessel, normal.  Short. LAD: Large vessel in the proximal segment.  Proximal LAD has an ulcerated lesion.  There is origin of a very large D1 at the proximal segment and a large septal perforator at which point the LAD is occluded and has faint ipsilateral collaterals from the circumflex, diagonal and the SP1, and after reconstitution of the LAD, there is a focal 80% stenosis.  The diagonal 1 is very large and has proximal high-grade 80 to 90% stenosis. CX: Large vessel, giving origin to small OM1 after which it is a CTO with bridging collaterals.  Distal circumflex also receives collaterals from the LAD.  Circumflex also gives collaterals to occluded RCA. RCA: It is occluded in the midsegment after the origin of RV branch.  Distal RCA including PL and PDA branches are large and are collateralized from circumflex and  LAD.  Recommendation: Patient has complex multivessel disease and would benefit from CABG with arterial conduits if possible including LIMA to LAD and diagonal and RIMA to RCA and probably a free radial graft to the circumflex.  Surgical consultation has been requested.  Patient needs to be admitted inpatient in view of complex coronary anatomy and high risk for cardiac events.  Treatments: surgery:   Date of Procedure:    11/14/2020  Preoperative Diagnosis:        Severe 3-vessel Coronary Artery Disease  Unstable Angina Pectoris  Postoperative Diagnosis:    Same  Procedure:        Coronary Artery Bypass Grafting x 4              Left Internal Mammary Artery to Distal Left Anterior Descending Coronary Artery             Free Right Internal Mammary Artery to Posterior Descending Coronary Artery  Left Radial Artery to Second Obtuse Marginal Branch of Left Circumflex Coronary Artery             Sapheonous Vein Graft to Diagonal Branch Coronary Artery             Endoscopic Vein Harvest from Right Thigh  Surgeon:        Salvatore Decentlarence H. Cornelius Moraswen, MD  Assistant:       Lowella DandyErin Bree Heinzelman, PA-C   Discharge Exam: Blood pressure 114/73, pulse 78, temperature 99.5 F (37.5 C), temperature source Oral, resp. rate 15, height 6' (1.829 m), weight 101.1 kg, SpO2 95 %.  General appearance: alert, cooperative and no distress Heart: regular rate and rhythm Lungs: clear to auscultation bilaterally Abdomen: soft, non-tender; bowel sounds normal; no masses,  no organomegaly Extremities: edema trace Wound: clean and dry  Discharge Medications:  The patient has been discharged on:   1.Beta Blocker:  Yes [  X ]                              No   [   ]                              If No, reason:  2.Ace Inhibitor/ARB: Yes [   ]                                     No  [ X   ]                                     If No, reason: titration of BB  3.Statin:   Yes [ X  ]                  No   [   ]                  If No, reason:  4.Ecasa:  Yes  [ X  ]                  No   [   ]                  If No, reason:    Discharge Instructions    Amb Referral to Cardiac Rehabilitation   Complete by: As directed    Diagnosis: CABG   CABG X ___: 4   After initial evaluation and assessments completed: Virtual Based Care may be provided alone or in conjunction with Phase 2 Cardiac Rehab based on patient barriers.: Yes     Allergies as of 11/22/2020      Reactions   Shrimp [shellfish Allergy] Swelling   Chest tighten      Medication List    STOP taking these medications   amphetamine-dextroamphetamine 20 MG tablet Commonly known as: ADDERALL   HYDROcodone-acetaminophen 10-325 MG tablet Commonly known as: NORCO   Olmesartan-amLODIPine-HCTZ 40-10-25 MG Tabs   OVER THE COUNTER MEDICATION     TAKE these medications   albuterol 108 (90 Base) MCG/ACT inhaler Commonly known as: VENTOLIN HFA Inhale 2 puffs into the lungs every 6 (six) hours as needed for wheezing or shortness of breath.   amiodarone 200 MG tablet  Commonly known as: PACERONE Take 2 tablets (400 mg total) by mouth 2 (two) times daily. X 3 days, then decrease to 200 mg (1 tab) by mouth twice daily for 7 days, then decrease to 200 mg daily   apixaban 5 MG Tabs tablet Commonly known as: ELIQUIS Take 1 tablet (5 mg total) by mouth 2 (two) times daily.   aspirin 81 MG EC tablet Take 1 tablet (81 mg total) by mouth daily. Swallow whole.   diazepam 10 MG tablet Commonly known as: VALIUM Take 1 tablet (10 mg total) by mouth at bedtime as needed for anxiety or sleep.   diltiazem 120 MG 24 hr capsule Commonly known as: CARDIZEM CD Take 1 capsule (120 mg total) by mouth daily.   diphenhydrAMINE 25 MG tablet Commonly known as: BENADRYL Take 25 mg by mouth every 6 (six) hours as needed for allergies.   furosemide 40 MG tablet Commonly known as: LASIX Take 1 tablet (40 mg total) by mouth daily.   glipiZIDE  10 MG tablet Commonly known as: GLUCOTROL Take 1 tablet (10 mg total) by mouth 2 (two) times daily before a meal.   metoprolol tartrate 100 MG tablet Commonly known as: LOPRESSOR Take 1 tablet (100 mg total) by mouth 2 (two) times daily.   montelukast 10 MG tablet Commonly known as: SINGULAIR Take 1 tablet by mouth at bedtime.   Oxycodone HCl 10 MG Tabs Take 1 tablet (10 mg total) by mouth every 6 (six) hours as needed for severe pain.   potassium chloride SA 20 MEQ tablet Commonly known as: KLOR-CON Take 1 tablet (20 mEq total) by mouth daily.   rosuvastatin 40 MG tablet Commonly known as: CRESTOR Take 1 tablet (40 mg total) by mouth daily.   VITAMIN B 12 PO Take 1 tablet by mouth daily.   Vitamin D (Cholecalciferol) 25 MCG (1000 UT) Tabs Take 1,000 Units by mouth daily.            Durable Medical Equipment  (From admission, onward)         Start     Ordered   11/20/20 0915  For home use only DME Walker rolling  Once       Question Answer Comment  Walker: With 5 Inch Wheels   Patient needs a walker to treat with the following condition Imbalance      11/19/20 0918   11/18/20 0923  For home use only DME Walker rolling  Once       Question Answer Comment  Walker: With 5 Inch Wheels   Patient needs a walker to treat with the following condition Physical deconditioning      11/18/20 0923          Follow-up Information    [facility] Follow up on 12/10/2020.   Specialty: Cardiothoracic Surgery Why: Appointment is at 1:00, please get CXR at 12:30 at [facility] located on first floor of our office building Contact information: [address] [place], [address] [place] [address] [phone]       Yates Decamp, MD Follow up on 12/25/2020.   Specialty: Cardiology Why: Appointment is at 3:45 Contact information: [address] [place] [address] [phone]        [facility] Follow up.   Why: Rolling walker arranged- to be delivered to room prior to discharge Contact information: [address] [place] [address] [phone]  [facility]. Go on 01/01/2021.   Why: f/u appointment at 2:30pm with Georgian Co (please arrive at 2:15pm)  Contact information: [address] [place] [address] [phone]              Signed:  Lowella Dandy, PA-C 11/22/2020, 7:31 AM

## 2020-11-21 NOTE — Progress Notes (Signed)
Mobility Specialist - Progress Note   11/21/20 1311  Mobility  Activity Ambulated in hall  Level of Assistance Standby assist, set-up cues, supervision of patient - no hands on  Assistive Device None  Distance Ambulated (ft) 470 ft  Mobility Ambulated with assistance in hallway  Mobility Response Tolerated well  Mobility performed by Mobility specialist  $Mobility charge 1 Mobility   Pt asx throughout ambulation. Pt sitting up on edge of bed after walk, wife in room. HR remained in 80s throughout.   Mamie Levers Mobility Specialist Mobility Specialist Phone: [phone]

## 2020-11-21 NOTE — Progress Notes (Signed)
Physical Therapy Treatment Patient Details Name: Gerald Andrade MRN: [id number] DOB: 01-07-1971 Today's Date: 11/21/2020    History of Present Illness Pt is a 50 year old male with PMH of HTN and COPD. He presented to the ED for evaluation of chest pain. 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    PT Comments    Pt received in supine, agreeable to therapy session and with good participation and tolerance for gait and transfer training. Pt given sternal precaution handout but able to perform without cues safely and demonstrates community distance ambulation with no AD and min guard or RW and Supervision, mild fatigue, DOE 1/4 and recovers quickly. Pt continues to benefit from PT services to progress toward functional mobility goals. Continue to recommend HHPT, anticipate pt safe to DC once medically cleared.   Follow Up Recommendations  Home health PT;Supervision for mobility/OOB     Equipment Recommendations  Rolling walker with 5" wheels;Other (comment) (may wean from RW, continue to assess)    Recommendations for Other Services       Precautions / Restrictions Precautions Precautions: Sternal Precaution Booklet Issued: Yes (comment) Precaution Comments: pt given handout and review/demo Restrictions Weight Bearing Restrictions: No Other Position/Activity Restrictions: sternal precs    Mobility  Bed Mobility Overal bed mobility: Modified Independent             General bed mobility comments: Increased time and able to perform log roll without cues, HOB elevated    Transfers Overall transfer level: Modified independent Equipment used: Rolling walker (2 wheeled) Transfers: Sit to/from Stand Sit to Stand: Modified independent (Device/Increase time)         General transfer comment: good hand placement on lap  Ambulation/Gait Ambulation/Gait assistance: Supervision;Min guard Gait Distance (Feet):  450 Feet Assistive device: Rolling walker (2 wheeled);None Gait Pattern/deviations: Step-through pattern;Decreased stride length   Gait velocity interpretation: 1.31 - 2.62 ft/sec, indicative of limited community ambulator General Gait Details: 286ft supervision with RW then 246ft no AD and min guard to supervision for safety, no LOB, mildly increased WOB/DOE 1/4 so cues for activity pacing and standing break, VSS on RA   Stairs             Wheelchair Mobility    Modified Rankin (Stroke Patients Only)       Balance                                 Standardized Balance Assessment Standardized Balance Assessment : Dynamic Gait Index   Dynamic Gait Index Level Surface: Mild Impairment Change in Gait Speed: Normal Gait with Horizontal Head Turns: Normal Gait with Vertical Head Turns: Normal Gait and Pivot Turn: Mild Impairment Step Over Obstacle: Normal Step Around Obstacles: Normal      Cognition Arousal/Alertness: Awake/alert Behavior During Therapy: Flat affect Overall Cognitive Status: Within Functional Limits for tasks assessed                                 General Comments: participatory      Exercises      General Comments General comments (skin integrity, edema, etc.): HR 94 bpm with exertion, RR 21 rpm; BP 117/73 (82) pre-exertion but no dizziness reported      Pertinent Vitals/Pain Pain Assessment: Faces Faces Pain Scale: Hurts little more Pain Location: sternal incision  esp with coughing Pain Descriptors / Indicators: Grimacing;Guarding Pain Intervention(s): Monitored during session;Repositioned;Other (comment) (education on manual splinting, per pt he's only been taking pain meds before bed)    Home Living                      Prior Function            PT Goals (current goals can now be found in the care plan section) Acute Rehab PT Goals Patient Stated Goal: home PT Goal Formulation: With  patient/family Time For Goal Achievement: 12/02/20 Potential to Achieve Goals: Good Progress towards PT goals: Progressing toward goals    Frequency    Min 3X/week      PT Plan Current plan remains appropriate       AM-PAC PT "6 Clicks" Mobility   Outcome Measure  Help needed turning from your back to your side while in a flat bed without using bedrails?: None Help needed moving from lying on your back to sitting on the side of a flat bed without using bedrails?: None Help needed moving to and from a bed to a chair (including a wheelchair)?: None Help needed standing up from a chair using your arms (e.g., wheelchair or bedside chair)?: None Help needed to walk in hospital room?: A Little Help needed climbing 3-5 steps with a railing? : A Little 6 Click Score: 22    End of Session Equipment Utilized During Treatment: Gait belt Activity Tolerance: Patient tolerated treatment well Patient left: in bed;with call bell/phone within reach;with family/visitor present (seated EOB, spouse present) Nurse Communication: Mobility status PT Visit Diagnosis: Difficulty in walking, not elsewhere classified (R26.2);Pain;Muscle weakness (generalized) (M62.81)     Time: 0912-0930 PT Time Calculation (min) (ACUTE ONLY): 18 min  Charges:  $Gait Training: 8-22 mins                     Vickee Mormino P., PTA Acute Rehabilitation Services Pager: [phone] Office: [phone]   Dorathy Kinsman Adolph Clutter 11/21/2020, 2:03 PM

## 2020-11-21 NOTE — Progress Notes (Signed)
      [address]       [place] [address]             [phone]      7 Days Post-Op Procedure(s) (LRB): CORONARY ARTERY BYPASS GRAFTING (CABG), ON PUMP, TIMES FOUR, USING BILATERAL INTERNAL MAMMARY ARTERIES, LEFT RADIAL ARTERY HARVESTED OPEN, AND RIGHT ENDOSCOPICALLY HARVESTED GREATER SAPHENOUS VEIN (N/A) TRANSESOPHAGEAL ECHOCARDIOGRAM (TEE) (N/A) RADIAL ARTERY HARVEST (Left) ENDOVEIN HARVEST OF GREATER SAPHENOUS VEIN (Right)   Subjective:  Patient had a better night.  Continues to feel a little better.  Ambulated yesterday without significant difficulty.  + BM  Objective: Vital signs in last 24 hours: Temp:  [98 F (36.7 C)-100.1 F (37.8 C)] 99.2 F (37.3 C) (05/12 0832) Pulse Rate:  [80-93] 87 (05/12 0832) Cardiac Rhythm: Normal sinus rhythm (05/12 0700) Resp:  [17-23] 18 (05/12 0832) BP: (98-128)/(67-74) 117/73 (05/12 0832) SpO2:  [92 %-94 %] 92 % (05/12 0832) Weight:  [102.7 kg] 102.7 kg (05/12 0402)  Intake/Output from previous day: 05/11 0701 - 05/12 0700 In: 480 [P.O.:480] Out: -  Intake/Output this shift: Total I/O In: 120 [P.O.:120] Out: -   General appearance: alert, cooperative and no distress Heart: regular rate and rhythm Lungs: clear to auscultation bilaterally Abdomen: soft, non-tender; bowel sounds normal; no masses,  no organomegaly Extremities: edema trace Wound: clean and dry  Lab Results: Recent Labs    11/21/20 0145  WBC 10.9*  HGB 10.4*  HCT 31.5*  PLT 348   BMET:  Recent Labs    11/19/20 0110 11/21/20 0145  NA 134* 132*  K 3.8 4.6  CL 95* 101  CO2 28 23  GLUCOSE 130* 106*  BUN 19 13  CREATININE 0.92 0.85  CALCIUM 8.1* 8.6*    PT/INR: No results for input(s): LABPROT, INR in the last 72 hours. ABG    Component Value Date/Time   PHART 7.314 (L) 11/14/2020 1954   HCO3 20.7 11/14/2020 1954   TCO2 22 11/14/2020 1954   ACIDBASEDEF 5.0 (H) 11/14/2020 1954   O2SAT 95.0 11/14/2020 1954   CBG (last 3)   Recent Labs    11/20/20 1542 11/20/20 2342 11/21/20 0609  GLUCAP 198* 143* 121*    Assessment/Plan: S/P Procedure(s) (LRB): CORONARY ARTERY BYPASS GRAFTING (CABG), ON PUMP, TIMES FOUR, USING BILATERAL INTERNAL MAMMARY ARTERIES, LEFT RADIAL ARTERY HARVESTED OPEN, AND RIGHT ENDOSCOPICALLY HARVESTED GREATER SAPHENOUS VEIN (N/A) TRANSESOPHAGEAL ECHOCARDIOGRAM (TEE) (N/A) RADIAL ARTERY HARVEST (Left) ENDOVEIN HARVEST OF GREATER SAPHENOUS VEIN (Right)  1. CV- PAF, currently in NSR- continue Lopressor, Cardizem, Amiodarone, Eliquis  2. Pulm- no acute issues, off oxygen, continue IS 3. Renal- creatinine WNL, K is at 4.6, some edema on exam start daily Lasix 40 mg daily 4. DM- started Glipizide yesterday, continue SSIP coverage, sugars mostly controlled 5. Dispo- patient stable, making progress in NSR this morning, will restart lasix today, monitor patient today, if HR remains controlled, will plan to d/c in AM   LOS: 8 days    Lowella Dandy, PA-C 11/21/2020

## 2020-11-21 NOTE — Progress Notes (Signed)
Occupational Therapy Treatment Patient Details Name: Gerald Andrade MRN: [id number] DOB: 06/05/1971 Today's Date: 11/21/2020    History of present illness Pt is a 50 year old male with PMH of HTN and COPD. He presented to the ED for evaluation of chest pain. 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   OT comments  Patient agreeable to OT session. Reviewed ADLs and compensatory techniques due to sternal precautions, completing LB dressing with supervision, transfers with supervision.  Reviewed and provided energy conservation techniques handout.  Voiced understanding with education.  Will follow acutely.    Follow Up Recommendations  No OT follow up    Equipment Recommendations  3 in 1 bedside commode;Tub/shower seat    Recommendations for Other Services      Precautions / Restrictions Precautions Precautions: Sternal Precaution Booklet Issued: Yes (comment) Precaution Comments: reviewed handout Restrictions Weight Bearing Restrictions: No Other Position/Activity Restrictions: sternal precautions       Mobility Bed Mobility Overal bed mobility: Modified Independent             General bed mobility comments: Increased time and able to perform log roll without cues, HOB elevated    Transfers Overall transfer level: Needs assistance Equipment used: Rolling walker (2 wheeled) Transfers: Sit to/from Stand Sit to Stand: Supervision         General transfer comment: sit to stand from EOB, side stepping with supervision to [facility]    Balance                                 Standardized Balance Assessment Standardized Balance Assessment : Dynamic Gait Index   Dynamic Gait Index Level Surface: Mild Impairment Change in Gait Speed: Normal Gait with Horizontal Head Turns: Normal Gait with Vertical Head Turns: Normal Gait and Pivot Turn: Mild Impairment Step Over Obstacle: Normal Step  Around Obstacles: Normal     ADL either performed or assessed with clinical judgement   ADL Overall ADL's : Needs assistance/impaired             Lower Body Bathing: Supervison/ safety;Sitting/lateral leans;Sit to/from stand   Upper Body Dressing : Supervision/safety;Sitting;Standing;Cueing for safety   Lower Body Dressing: Supervision/safety;Sitting/lateral leans;Sit to/from stand   Toilet Transfer: Supervision/safety;Ambulation Toilet Transfer Details (indicate cue type and reason): simulated side stepping in room         Functional mobility during ADLs: Supervision/safety General ADL Comments: pt limited by impaired activity tolerance and generalized weakness.  Reviewed LB bathing/dressing seated, techniques for sternal precautions and energy conservation.     Vision       Perception     Praxis      Cognition Arousal/Alertness: Awake/alert Behavior During Therapy: Flat affect Overall Cognitive Status: Within Functional Limits for tasks assessed                                 General Comments: participatory        Exercises Exercises: Other exercises (pt given post-CABG handout exercises, will review next session if he has any questions)   Shoulder Instructions       General Comments VSS    Pertinent Vitals/ Pain       Pain Assessment: Faces Faces Pain Scale: Hurts little more Pain Location: sternal incision esp with coughing Pain Descriptors / Indicators: Grimacing;Guarding Pain Intervention(s): Limited  activity within patient's tolerance;Monitored during session;Repositioned  Home Living                                          Prior Functioning/Environment              Frequency  Min 2X/week        Progress Toward Goals  OT Goals(current goals can now be found in the care plan section)  Progress towards OT goals: Progressing toward goals  Acute Rehab OT Goals Patient Stated Goal: home OT Goal  Formulation: With patient  Plan Discharge plan remains appropriate;Frequency remains appropriate    Co-evaluation                 AM-PAC OT "6 Clicks" Daily Activity     Outcome Measure   Help from another person eating meals?: None Help from another person taking care of personal grooming?: A Little Help from another person toileting, which includes using toliet, bedpan, or urinal?: A Little Help from another person bathing (including washing, rinsing, drying)?: A Little Help from another person to put on and taking off regular upper body clothing?: A Little Help from another person to put on and taking off regular lower body clothing?: A Little 6 Click Score: 19    End of Session    OT Visit Diagnosis: Unsteadiness on feet (R26.81);Muscle weakness (generalized) (M62.81);Pain   Activity Tolerance Patient tolerated treatment well   Patient Left in bed;with call bell/phone within reach;with family/visitor present   Nurse Communication Mobility status        Time: 1440-1501 OT Time Calculation (min): 21 min  Charges: OT General Charges $OT Visit: 1 Visit OT Treatments $Self Care/Home Management : 8-22 mins  Barry Brunner, OT Acute Rehabilitation Services Pager [phone] Office [phone]    Chancy Milroy 11/21/2020, 3:17 PM

## 2020-11-21 NOTE — Progress Notes (Signed)
Pt just ambulated with MT. Discussed with pt and wife IS, sternal precautions, diet, exercise, and CRPII. Pt receptive. Will refer to [facility] however might benefit from virtual program due to lack of insurance. Encouraged another walk this pm. [phone] Ethelda Chick CES, ACSM 2:31 PM 11/21/2020

## 2020-11-22 ENCOUNTER — Other Ambulatory Visit (HOSPITAL_COMMUNITY): Payer: Self-pay

## 2020-11-22 LAB — GLUCOSE, CAPILLARY
Glucose-Capillary: 104 mg/dL — ABNORMAL HIGH (ref 70–99)
Glucose-Capillary: 138 mg/dL — ABNORMAL HIGH (ref 70–99)

## 2020-11-22 MED ORDER — GLIPIZIDE 10 MG PO TABS
10.0000 mg | ORAL_TABLET | Freq: Two times a day (BID) | ORAL | 3 refills | Status: DC
  Filled 2020-11-22: qty 60, 30d supply, fill #0

## 2020-11-22 MED ORDER — DIAZEPAM 5 MG PO TABS
10.0000 mg | ORAL_TABLET | Freq: Every evening | ORAL | 0 refills | Status: DC | PRN
  Filled 2020-11-22: qty 60, 30d supply, fill #0

## 2020-11-22 MED ORDER — APIXABAN 5 MG PO TABS
5.0000 mg | ORAL_TABLET | Freq: Two times a day (BID) | ORAL | 3 refills | Status: DC
  Filled 2020-11-22: qty 60, 30d supply, fill #0

## 2020-11-22 MED ORDER — FUROSEMIDE 40 MG PO TABS
40.0000 mg | ORAL_TABLET | Freq: Every day | ORAL | 0 refills | Status: DC
  Filled 2020-11-22: qty 7, 7d supply, fill #0

## 2020-11-22 MED ORDER — ASPIRIN 81 MG PO TBEC: 81.0000 mg | DELAYED_RELEASE_TABLET | Freq: Every day | ORAL | 11 refills | Status: DC

## 2020-11-22 MED ORDER — DILTIAZEM HCL ER COATED BEADS 120 MG PO CP24
120.0000 mg | ORAL_CAPSULE | Freq: Every day | ORAL | 3 refills | Status: DC
  Filled 2020-11-22: qty 30, 30d supply, fill #0

## 2020-11-22 MED ORDER — POTASSIUM CHLORIDE CRYS ER 20 MEQ PO TBCR
20.0000 meq | EXTENDED_RELEASE_TABLET | Freq: Every day | ORAL | 0 refills | Status: DC
  Filled 2020-11-22: qty 7, 7d supply, fill #0

## 2020-11-22 MED ORDER — AMIODARONE HCL 200 MG PO TABS
400.0000 mg | ORAL_TABLET | Freq: Two times a day (BID) | ORAL | 1 refills | Status: DC
  Filled 2020-11-22: qty 46, 30d supply, fill #0

## 2020-11-22 MED ORDER — METOPROLOL TARTRATE 100 MG PO TABS
100.0000 mg | ORAL_TABLET | Freq: Two times a day (BID) | ORAL | 3 refills | Status: DC
  Filled 2020-11-22: qty 60, 30d supply, fill #0

## 2020-11-22 MED ORDER — ROSUVASTATIN CALCIUM 40 MG PO TABS
40.0000 mg | ORAL_TABLET | Freq: Every day | ORAL | 3 refills | Status: DC
  Filled 2020-11-22: qty 30, 30d supply, fill #0

## 2020-11-22 MED ORDER — OXYCODONE HCL 10 MG PO TABS
10.0000 mg | ORAL_TABLET | Freq: Four times a day (QID) | ORAL | 0 refills | Status: DC | PRN
  Filled 2020-11-22: qty 30, 7d supply, fill #0

## 2020-11-22 NOTE — Progress Notes (Signed)
CARDIAC REHAB PHASE I   Reinforced d/c ed with pt. Pt states he feels much stronger, excited to be going home. Walker at bedside for home use. Referred to CRP [facility] [place].   [phone] Reynold Bowen, RN BSN 11/22/2020 8:39 AM

## 2020-11-22 NOTE — Progress Notes (Signed)
Discharge instructions (including medications) discussed with and copy provided to patient/caregiver by Micael Hampshire RN

## 2020-11-22 NOTE — Progress Notes (Signed)
Mobility Specialist: Progress Note   11/22/20 1048  Mobility  Activity Ambulated in hall  Level of Assistance Standby assist, set-up cues, supervision of patient - no hands on  Assistive Device Front wheel walker  Distance Ambulated (ft) 240 ft  Mobility Ambulated with assistance in hallway  Mobility Response Tolerated well  Mobility performed by Mobility specialist  $Mobility charge 1 Mobility   Pt to BR before walk and then agreeable to ambulate. Pt c/o feeling dizzy before ambulation but otherwise asx. Pt is eager for discharge.   [facility] Jessejames Steelman Mobility Specialist Mobility Specialist Phone: [phone]

## 2020-11-22 NOTE — TOC Transition Note (Signed)
Transition of Care (TOC) - CM/SW Discharge Note Donn Pierini RN, BSN Transitions of Care Unit 4E- RN Case Manager See Treatment Team for direct phone #    Patient Details  Name: Gerald Andrade MRN: [id number] Date of Birth: [birth year]  Transition of Care [facility]) CM/SW Contact:  Darrold Span, RN Phone Number: 11/22/2020, 11:32 AM   Clinical Narrative:    Pt stable for transition home today, s/p CABG. Wife to transport home.  RW has been delivered to room.  TOC to fill meds for discharge, pt has been placed in [facility] for patient medication assistance needs. Wife to f/u on Eliquis patient assistance application.    Final next level of care: Home/Self Care Barriers to Discharge: Barriers Resolved   Patient Goals and CMS Choice Patient states their goals for this hospitalization and ongoing recovery are:: return home   Choice offered to / list presented to : NA [facility] referral)  Discharge Placement                 Home      Discharge Plan and Services In-house Referral: Financial Counselor Discharge Planning Services: [facility] Program Post Acute Care Choice: Durable Medical Equipment,Home Health          DME Arranged: 3-N-1,Walker rolling DME Agency: [employer] Date DME Agency Contacted: 11/20/20 Time DME Agency Contacted: 1100 Representative spoke with at DME Agency: Duwayne Heck HH Arranged: PT HH Agency: [facility] Date HH Agency Contacted: 11/20/20 Time HH Agency Contacted: 1206 Representative spoke with at [facility] Agency: Pearson Grippe  Social Determinants of Health (SDOH) Interventions     Readmission Risk Interventions Readmission Risk Prevention Plan 11/22/2020  Transportation Screening Complete  PCP or Specialist Appt within 5-7 Days Complete  Home Care Screening Complete  Medication Review (RN CM) Complete  Some recent data might be hidden

## 2020-11-22 NOTE — Progress Notes (Signed)
      [address]       [place] [address]             [phone]      8 Days Post-Op Procedure(s) (LRB): CORONARY ARTERY BYPASS GRAFTING (CABG), ON PUMP, TIMES FOUR, USING BILATERAL INTERNAL MAMMARY ARTERIES, LEFT RADIAL ARTERY HARVESTED OPEN, AND RIGHT ENDOSCOPICALLY HARVESTED GREATER SAPHENOUS VEIN (N/A) TRANSESOPHAGEAL ECHOCARDIOGRAM (TEE) (N/A) RADIAL ARTERY HARVEST (Left) ENDOVEIN HARVEST OF GREATER SAPHENOUS VEIN (Right)   Subjective:  No new complaints.  Continues to feel better.  + ambulation  + BM  Objective: Vital signs in last 24 hours: Temp:  [98.5 F (36.9 C)-99.5 F (37.5 C)] 99.5 F (37.5 C) (05/13 0452) Pulse Rate:  [74-89] 78 (05/13 0452) Cardiac Rhythm: Normal sinus rhythm (05/12 1900) Resp:  [15-20] 15 (05/13 0452) BP: (108-122)/(66-76) 114/73 (05/13 0452) SpO2:  [92 %-98 %] 95 % (05/13 0452) Weight:  [101.1 kg] 101.1 kg (05/13 0452)  Intake/Output from previous day: 05/12 0701 - 05/13 0700 In: 360 [P.O.:360] Out: -   General appearance: alert, cooperative and no distress Heart: regular rate and rhythm Lungs: clear to auscultation bilaterally Abdomen: soft, non-tender; bowel sounds normal; no masses,  no organomegaly Extremities: edema trace Wound: clean and dry  Lab Results: Recent Labs    11/21/20 0145  WBC 10.9*  HGB 10.4*  HCT 31.5*  PLT 348   BMET:  Recent Labs    11/21/20 0145  NA 132*  K 4.6  CL 101  CO2 23  GLUCOSE 106*  BUN 13  CREATININE 0.85  CALCIUM 8.6*    PT/INR: No results for input(s): LABPROT, INR in the last 72 hours. ABG    Component Value Date/Time   PHART 7.314 (L) 11/14/2020 1954   HCO3 20.7 11/14/2020 1954   TCO2 22 11/14/2020 1954   ACIDBASEDEF 5.0 (H) 11/14/2020 1954   O2SAT 95.0 11/14/2020 1954   CBG (last 3)  Recent Labs    11/21/20 1624 11/21/20 2126 11/22/20 0614  GLUCAP 74 104* 138*    Assessment/Plan: S/P Procedure(s) (LRB): CORONARY ARTERY BYPASS GRAFTING  (CABG), ON PUMP, TIMES FOUR, USING BILATERAL INTERNAL MAMMARY ARTERIES, LEFT RADIAL ARTERY HARVESTED OPEN, AND RIGHT ENDOSCOPICALLY HARVESTED GREATER SAPHENOUS VEIN (N/A) TRANSESOPHAGEAL ECHOCARDIOGRAM (TEE) (N/A) RADIAL ARTERY HARVEST (Left) ENDOVEIN HARVEST OF GREATER SAPHENOUS VEIN (Right)  1. CV- PAF, maintaining NSR for past 24 hours- will continue Amiodarone, Lopressor, Cardizem, Eliquis- financial assistance paper completed 2. Pulm- no acute issues, continue IS 3. Renal- weight is trending down, mild edema- will taper lasix off over next week 4. DM- sugars controlled, on glipizide, patient received education 5. Dispo- patient stable, will d/c home today   LOS: 9 days    Lowella Dandy, PA-C 11/22/2020

## 2020-11-22 NOTE — Progress Notes (Signed)
Physical Therapy Treatment Patient Details Name: Gerald Andrade MRN: [id number] DOB: Dec 14, 1970 Today's Date: 11/22/2020    History of Present Illness Pt is a 50 year old male with PMH of HTN and COPD. He presented to the ED for evaluation of chest pain. 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    PT Comments    Pt received in supine, agreeable to therapy session and with good participation in BLE therapeutic exercise instruction for strengthening. Pt performed supine/seated/standing exercises with good tolerance as detailed below, with minor LOB during standing exercises at RW needing minA to guide to seated position safely, reinforced importance of assist with HEP completion in stance and pt spouse agreeable to assist him. Reviewed sternal precautions and fall risk prevention, pt receptive. Pt continues to benefit from PT services to progress toward functional mobility goals. Discharge recommendations below remain appropriate, pt given HEP handout for strengthening/balance in case HHPT remains unavailable.  Follow Up Recommendations  Home health PT;Supervision for mobility/OOB     Equipment Recommendations  Rolling walker with 5" wheels    Recommendations for Other Services       Precautions / Restrictions Precautions Precautions: Sternal Precaution Booklet Issued: Yes (comment) Precaution Comments: reviewed handout Restrictions Weight Bearing Restrictions: No Other Position/Activity Restrictions: sternal precautions    Mobility  Bed Mobility Overal bed mobility: Modified Independent             General bed mobility comments: Increased time and able to perform log roll without cues, HOB elevated    Transfers Overall transfer level: Needs assistance Equipment used: Rolling walker (2 wheeled) Transfers: Sit to/from Stand Sit to Stand: Supervision         General transfer comment: sit<>stand, min  cues for UE placement  Ambulation/Gait Ambulation/Gait assistance: Supervision;Min assist Gait Distance (Feet): 10 Feet Assistive device: Rolling walker (2 wheeled);None Gait Pattern/deviations: Step-through pattern     General Gait Details: stepping in room near EOB for standing exercises, pt defers hallway ambulation due to fatigue/poor appetite   Stairs             Wheelchair Mobility    Modified Rankin (Stroke Patients Only)       Balance Overall balance assessment: Needs assistance Sitting-balance support: No upper extremity supported Sitting balance-Leahy Scale: Good     Standing balance support: Bilateral upper extremity supported Standing balance-Leahy Scale: Fair Standing balance comment: pt with LOB to R needing minA to correct during standing exercise despite RW support, pt reports mild dizziness, BP soft but stable seated/standing                            Cognition Arousal/Alertness:  (drowsy) Behavior During Therapy: Flat affect Overall Cognitive Status: Within Functional Limits for tasks assessed                                 General Comments: participatory      Exercises Other Exercises Other Exercises: standing BLE AROM; hip abduction, hamstring curls (L only due to RLE pain), hip extension, hip flexion, heel raises x5-10 reps ea Other Exercises: seated BLE AROM: LAQ, ankle pumps x10 reps ea Other Exercises: supine BLE AROM: SLR, hip abduction, ankle pumps    General Comments General comments (skin integrity, edema, etc.): HR 81 bpm RR 18 rpm BP 104/74 (83) seated EOB; BP 103/65 (77)  standing      Pertinent Vitals/Pain Pain Assessment: Faces Faces Pain Scale: Hurts little more Pain Location: sternal incision esp with coughing Pain Descriptors / Indicators: Grimacing;Guarding Pain Intervention(s): Monitored during session;Repositioned    Home Living                      Prior Function             PT Goals (current goals can now be found in the care plan section) Acute Rehab PT Goals Patient Stated Goal: home PT Goal Formulation: With patient/family Time For Goal Achievement: 12/02/20 Potential to Achieve Goals: Good Progress towards PT goals: Progressing toward goals    Frequency    Min 3X/week      PT Plan Current plan remains appropriate    Co-evaluation              AM-PAC PT "6 Clicks" Mobility   Outcome Measure  Help needed turning from your back to your side while in a flat bed without using bedrails?: None Help needed moving from lying on your back to sitting on the side of a flat bed without using bedrails?: None Help needed moving to and from a bed to a chair (including a wheelchair)?: None Help needed standing up from a chair using your arms (e.g., wheelchair or bedside chair)?: A Little Help needed to walk in hospital room?: A Little Help needed climbing 3-5 steps with a railing? : A Little 6 Click Score: 21    End of Session Equipment Utilized During Treatment: Gait belt Activity Tolerance: Patient tolerated treatment well Patient left: in bed;with call bell/phone within reach;with family/visitor present Nurse Communication: Mobility status PT Visit Diagnosis: Difficulty in walking, not elsewhere classified (R26.2);Pain;Muscle weakness (generalized) (M62.81)     Time: 0093-8182 PT Time Calculation (min) (ACUTE ONLY): 16 min  Charges:  $Therapeutic Exercise: 8-22 mins                     Jatinder Mcdonagh P., PTA Acute Rehabilitation Services Pager: [phone] Office: [phone]   Angus [place]ms 11/22/2020, 11:38 AM

## 2020-11-22 NOTE — Plan of Care (Signed)
  Problem: Acute Rehab PT Goals(only PT should resolve) Goal: Pt Will Go Supine/Side To Sit Outcome: Adequate for Discharge Goal: Pt Will Go Sit To Supine/Side Outcome: Adequate for Discharge Goal: Patient Will Transfer Sit To/From Stand Outcome: Adequate for Discharge Goal: Pt Will Ambulate Outcome: Adequate for Discharge Goal: Pt Will Verbalize and Adhere to Precautions While Description: PT Will Verbalize and Adhere to Precautions While Performing Mobility Outcome: Adequate for Discharge

## 2020-11-25 ENCOUNTER — Other Ambulatory Visit (HOSPITAL_BASED_OUTPATIENT_CLINIC_OR_DEPARTMENT_OTHER): Payer: Self-pay

## 2020-11-27 ENCOUNTER — Telehealth: Payer: Self-pay

## 2020-11-27 NOTE — Telephone Encounter (Signed)
Copied from CRM [id number]. Topic: General - Other >> Nov 21, 2020  2:26 PM Leafy Ro wrote: Reason for CRM: Pt would like to apply for orange card or financial assist  Called patient anf LVM x2 regarding financial. If patient calls back please advise patient that he needs to complete his upcoming appointment in June prior to applying for financial advising. Please advise patient that if he is approved for financial assistance that his first visit will be covered because the letter works retroactively if approved.

## 2020-11-28 ENCOUNTER — Other Ambulatory Visit: Payer: Self-pay | Admitting: Physician Assistant

## 2020-11-28 ENCOUNTER — Telehealth: Payer: Self-pay

## 2020-11-28 ENCOUNTER — Other Ambulatory Visit: Payer: Self-pay

## 2020-11-28 MED ORDER — OXYCODONE HCL 5 MG PO TABS: 5.0000 mg | ORAL_TABLET | ORAL | 0 refills | Status: DC | PRN

## 2020-11-28 NOTE — Telephone Encounter (Signed)
Patient's wife, Raynelle Fanning contacted the office requesting a refill on patient's behalf.  She requested refill of Oxycodone.  Patient is s/p CABGx4 with Dr. Cornelius Moras 11/14/20. Patient is not able to receive another refill of pain medication until 11/29/20 or after because it was filled 11/22/20. She stated that she was trying to be proactive so he would not run out over the weekend.  Per Lowella Dandy, PA, refill sent into the [facility] on [address] in [place] and will not be filled until 11/30/20. Patient's wife aware.

## 2020-11-29 ENCOUNTER — Ambulatory Visit (HOSPITAL_COMMUNITY)
Admit: 2020-11-29 | Discharge: 2020-11-29 | Disposition: A | Discharge status: Home / Self Care | Payer: Self-pay | Source: Ambulatory Visit | Attending: Physician Assistant | Admitting: Physician Assistant

## 2020-11-29 ENCOUNTER — Other Ambulatory Visit: Payer: Self-pay

## 2020-11-29 VITALS — BP 100/60 | HR 79 | Ht 72.0 in | Wt 197.8 lb

## 2020-11-29 DIAGNOSIS — I255 Ischemic cardiomyopathy: Secondary | ICD-10-CM | POA: Insufficient documentation

## 2020-11-29 DIAGNOSIS — Z951 Presence of aortocoronary bypass graft: Secondary | ICD-10-CM | POA: Insufficient documentation

## 2020-11-29 DIAGNOSIS — Z87891 Personal history of nicotine dependence: Secondary | ICD-10-CM | POA: Insufficient documentation

## 2020-11-29 DIAGNOSIS — D6869 Other thrombophilia: Secondary | ICD-10-CM | POA: Insufficient documentation

## 2020-11-29 DIAGNOSIS — E118 Type 2 diabetes mellitus with unspecified complications: Secondary | ICD-10-CM | POA: Insufficient documentation

## 2020-11-29 DIAGNOSIS — Z8679 Personal history of other diseases of the circulatory system: Secondary | ICD-10-CM | POA: Insufficient documentation

## 2020-11-29 DIAGNOSIS — Z7901 Long term (current) use of anticoagulants: Secondary | ICD-10-CM | POA: Insufficient documentation

## 2020-11-29 DIAGNOSIS — Z7982 Long term (current) use of aspirin: Secondary | ICD-10-CM | POA: Insufficient documentation

## 2020-11-29 DIAGNOSIS — Z79899 Other long term (current) drug therapy: Secondary | ICD-10-CM | POA: Insufficient documentation

## 2020-11-29 DIAGNOSIS — Z7984 Long term (current) use of oral hypoglycemic drugs: Secondary | ICD-10-CM | POA: Insufficient documentation

## 2020-11-29 DIAGNOSIS — I2510 Atherosclerotic heart disease of native coronary artery without angina pectoris: Secondary | ICD-10-CM | POA: Insufficient documentation

## 2020-11-29 DIAGNOSIS — I480 Paroxysmal atrial fibrillation: Secondary | ICD-10-CM | POA: Insufficient documentation

## 2020-11-29 MED ORDER — AMIODARONE HCL 200 MG PO TABS
ORAL_TABLET | ORAL | Status: DC
Start: 2020-11-29 — End: 2020-12-25

## 2020-11-29 NOTE — Progress Notes (Signed)
Primary Care Physician: Kaleen Mask, MD Primary Cardiologist: Dr Jacinto Halim Primary Electrophysiologist: none Referring Physician: Dr Fleet Contras Gerald Andrade is a 50 y.o. male with a history of CAD s/p CABG, DM, HLD, and atrial fibrillation who presents for consultation in the [facility].  The patient was initially diagnosed with atrial fibrillation 11/17/2020 postoperatively. He had CABG 11/14/20 and was noted on telemetry to be in afib RVR three days later. He was started on amiodarone and converted to SR. Patient is on Eliquis for a CHADS2VASC score of 2. He reports that he is slowly improving post surgery. He has expected incisional soreness. His appetite has been slow to return but he has had two bowel movements.   Today, he denies symptoms of palpitations, shortness of breath, orthopnea, PND, lower extremity edema, dizziness, presyncope, syncope, snoring, daytime somnolence, bleeding, or neurologic sequela. The patient is tolerating medications without difficulties and is otherwise without complaint today.    Atrial Fibrillation Risk Factors:  he does not have symptoms or diagnosis of sleep apnea. he does not have a history of rheumatic fever.   he has a BMI of Body mass index is 26.83 kg/m.Marland Kitchen Filed Weights   11/29/20 1046  Weight: 89.7 kg    Family History  Problem Relation Age of Onset  . CAD Mother   . CAD Father      Atrial Fibrillation Management history:  Previous antiarrhythmic drugs: amiodarone  Previous cardioversions: none Previous ablations: none CHADS2VASC score: 2 Anticoagulation history: Eliquis    Past Medical History:  Diagnosis Date  . Coronary artery disease    . Fatty liver   . 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 Type II diabetes mellitus (HCC) 11/13/2020   Past Surgical History:  Procedure Laterality Date  . CORONARY ARTERY BYPASS GRAFT N/A 11/14/2020    Procedure: CORONARY ARTERY BYPASS GRAFTING (CABG), ON PUMP, TIMES FOUR, USING BILATERAL INTERNAL MAMMARY ARTERIES, LEFT RADIAL ARTERY HARVESTED OPEN, AND RIGHT ENDOSCOPICALLY HARVESTED GREATER SAPHENOUS VEIN;  Surgeon: Purcell Nails, MD;  Location: [facility] OR;  Service: Open Heart Surgery;  Laterality: N/A;  . ENDOVEIN HARVEST OF GREATER SAPHENOUS VEIN Right 11/14/2020   Procedure: ENDOVEIN HARVEST OF GREATER SAPHENOUS VEIN;  Surgeon: Purcell Nails, MD;  Location: [facility] OR;  Service: Open Heart Surgery;  Laterality: Right;  . LEFT HEART CATH AND CORONARY ANGIOGRAPHY N/A 11/12/2020   Procedure: LEFT HEART CATH AND CORONARY ANGIOGRAPHY;  Surgeon: Yates Decamp, MD;  Location: [facility] INVAS[facility] CV LAB;  Service: Cardiovascular;  Laterality: N/A;  . RADIAL ARTERY HARVEST Left 11/14/2020   Procedure: RADIAL ARTERY HARVEST;  Surgeon: Purcell Nails, MD;  Location: [facility] OR;  Service: Open Heart Surgery;  Laterality: Left;  . TEE WITHOUT CARDIOVERSION N/A 11/14/2020   Procedure: TRANSESOPHAGEAL ECHOCARDIOGRAM (TEE);  Surgeon: Purcell Nails, MD;  Location: [facility] OR;  Service: Open Heart Surgery;  Laterality: N/A;    Current Outpatient Medications  Medication Sig Dispense Refill  . albuterol (PROVENTIL HFA;VENTOLIN HFA) 108 (90 BASE) MCG/ACT inhaler Inhale 2 puffs into the lungs every 6 (six) hours as needed for wheezing or shortness of breath.    Marland Kitchen amiodarone (PACERONE) 200 MG tablet Take 2 tablets (400 mg total) by mouth 2 (two) times daily. X 3 days, then decrease to 200 mg (1 tab) by mouth twice daily for 7 days, then decrease to 200 mg daily 90  tablet 1  . apixaban (ELIQUIS) 5 MG TABS tablet Take 1 tablet (5 mg total) by mouth 2 (two) times daily. 60 tablet 3  . aspirin EC 81 MG EC tablet Take 1 tablet (81 mg total) by mouth daily. Swallow whole. 30 tablet 11  . diazepam (VALIUM) 5 MG tablet Take 2 tablets (10 mg total) by mouth at bedtime as needed for anxiety. 60 tablet 0  . diltiazem (CARDIZEM CD) 120 MG 24 hr capsule  Take 1 capsule (120 mg total) by mouth daily. 30 capsule 3  . diphenhydrAMINE (BENADRYL) 25 MG tablet Take 25 mg by mouth as needed for allergies.    . furosemide (LASIX) 40 MG tablet Take 1 tablet (40 mg total) by mouth daily. 7 tablet 0  . glipiZIDE (GLUCOTROL) 10 MG tablet Take 1 tablet (10 mg total) by mouth 2 (two) times daily before a meal. 60 tablet 3  . metoprolol tartrate (LOPRESSOR) 100 MG tablet Take 1 tablet (100 mg total) by mouth 2 (two) times daily. 60 tablet 3  . oxyCODONE (ROXICODONE) 5 MG immediate release tablet Take 1 tablet (5 mg total) by mouth every 4 (four) hours as needed for severe pain. 30 tablet 0  . potassium chloride SA (KLOR-CON) 20 MEQ tablet Take 1 tablet (20 mEq total) by mouth daily. 7 tablet 0  . rosuvastatin (CRESTOR) 40 MG tablet Take 1 tablet (40 mg total) by mouth daily. 30 tablet 3  . Cyanocobalamin (VITAMIN B 12 PO) Take 1 tablet by mouth daily. (Patient not taking: Reported on 11/29/2020)    . montelukast (SINGULAIR) 10 MG tablet Take 1 tablet by mouth at bedtime. (Patient not taking: Reported on 11/29/2020)    . Vitamin D, Cholecalciferol, 25 MCG (1000 UT) TABS Take 1,000 Units by mouth daily. (Patient not taking: Reported on 11/29/2020)     No current facility-administered medications for this encounter.    Allergies  Allergen Reactions  . Shrimp [Shellfish Allergy] Swelling    Chest tighten     Social History   Socioeconomic History  . Marital status: Married    Spouse name: Not on file  . Number of children: Not on file  . Years of education: Not on file  . Highest education level: Not on file  Occupational History  . Not on file  Tobacco Use  . Smoking status: Former Smoker    Types: Cigarettes  . Smokeless tobacco: Former Neurosurgeon    Types: Chew  Substance and Sexual Activity  . Alcohol use: No  . Drug use: No  . Sexual activity: Not on file  Other Topics Concern  . Not on file  Social History Narrative  . Not on file   Social  Determinants of Health   Financial Resource Strain: Not on file  Food Insecurity: Not on file  Transportation Needs: Not on file  Physical Activity: Not on file  Stress: Not on file  Social Connections: Not on file  Intimate Partner Violence: Not on file     ROS- All systems are reviewed and negative except as per the HPI above.  Physical Exam: Vitals:   11/29/20 1046  BP: 100/60  Pulse: 79  Weight: 89.7 kg  Height: 6' (1.829 m)    GEN- The patient is a well appearing male, alert and oriented x 3 today.  Head- normocephalic, atraumatic Eyes-  Sclera clear, conjunctiva pink Ears- hearing intact Oropharynx- clear Neck- supple  Lungs- Clear to ausculation bilaterally, normal work of breathing Heart- Regular rate  and rhythm, no murmurs, rubs or gallops  GI- soft, NT, ND, + BS Extremities- no clubbing, cyanosis, or edema MS- no significant deformity or atrophy Skin- no rash or lesion Psych- euthymic mood, full affect Neuro- strength and sensation are intact  Wt Readings from Last 3 Encounters:  11/29/20 89.7 kg  11/22/20 101.1 kg    EKG today demonstrates  SR, prolonged QT Vent. rate 79 BPM PR interval 130 ms QRS duration 104 ms QT/QTcB 476/545 ms  Echo 11/12/20 demonstrated  1. Left ventricular ejection fraction, by estimation, is 55 to 60%. The  left ventricle has normal function. The left ventricle has no regional  wall motion abnormalities. Left ventricular diastolic parameters were  normal.  2. Right ventricular systolic function is normal. The right ventricular  size is normal.  3. The mitral valve is grossly normal. No evidence of mitral valve  regurgitation.  4. The aortic valve is tricuspid. Aortic valve regurgitation is not  visualized.   Epic records are reviewed at length today  CHA2DS2-VASc Score = 2  The patient's score is based upon: CHF History: No HTN History: No Diabetes History: Yes Stroke History: No Vascular Disease History:  Yes Age Score: 0 Gender Score: 0      ASSESSMENT AND PLAN: 1. Paroxysmal Atrial Fibrillation (ICD10:  I48.0) The patient's CHA2DS2-VASc score is 2, indicating a 2.2% annual risk of stroke.   Occurred perioperatively  He appears to be maintaining SR.  Will decrease amiodarone to 200 mg daily given SR and prolonged QT. Anticipate this medication will be short term.  Continue Eliquis 5 mg BID Continue Lopressor 100 mg BID Continue diltiazem 120 mg daily  2. Secondary Hypercoagulable State (ICD10:  D68.69) The patient is at significant risk for stroke/thromboembolism based upon his CHA2DS2-VASc Score of 2.  Continue Apixaban (Eliquis).   3. CAD/ischemic CM S/p CABG 11/14/20 No anginal symptoms. Follow up with CTS and primary cardiologist.    Follow up with Dr Jacinto Halim as scheduled. AF clinic as needed.    Jorja Loa PA-C Af[facility] [address] [place], [place] [address] [phone] 11/29/2020 11:08 AM

## 2020-11-29 NOTE — Patient Instructions (Signed)
Decrease Amiodarone to one tablet by mouth daily Stop Lasix

## 2020-12-04 ENCOUNTER — Other Ambulatory Visit: Payer: Self-pay | Admitting: Thoracic Surgery (Cardiothoracic Vascular Surgery)

## 2020-12-04 DIAGNOSIS — Z951 Presence of aortocoronary bypass graft: Secondary | ICD-10-CM

## 2020-12-06 ENCOUNTER — Telehealth: Payer: Self-pay | Admitting: *Deleted

## 2020-12-06 ENCOUNTER — Telehealth (HOSPITAL_COMMUNITY): Payer: Self-pay

## 2020-12-06 ENCOUNTER — Other Ambulatory Visit: Payer: Self-pay | Admitting: Physician Assistant

## 2020-12-06 MED ORDER — TRAMADOL HCL 50 MG PO TABS: 50.0000 mg | ORAL_TABLET | Freq: Four times a day (QID) | ORAL | 0 refills | Status: DC | PRN

## 2020-12-06 NOTE — Telephone Encounter (Signed)
Called patient to see if he is interested in the Cardiac Rehab Program([facility]). Patient expressed interest. Explained scheduling process, patient verbalized understanding. Will contact patient for scheduling once f/u has been completed.

## 2020-12-06 NOTE — Telephone Encounter (Signed)
Patient's wife, Raynelle Fanning, contacted the office requesting a refill of Oxycodone for Mr. Yett. He is s/p CABG 5/5 by Dr. Cornelius Moras. Per wife, patient is not able to take Tylenol solely for pain. Doree Fudge, PA sent in prescription of Ultram to patients preferred pharmacy. Raynelle Fanning made known that patient is to not take oxycodone and ultram together. He is only to begin the ultram once he is out of oxycodone. Raynelle Fanning verbalizes understanding and accepts receipt.

## 2020-12-06 NOTE — Progress Notes (Unsigned)
U

## 2020-12-10 ENCOUNTER — Ambulatory Visit (INDEPENDENT_AMBULATORY_CARE_PROVIDER_SITE_OTHER): Payer: Self-pay | Admitting: Physician Assistant

## 2020-12-10 ENCOUNTER — Ambulatory Visit
Admission: RE | Admit: 2020-12-10 | Discharge: 2020-12-10 | Disposition: A | Discharge status: Home / Self Care | Payer: Self-pay | Source: Ambulatory Visit | Attending: Thoracic Surgery (Cardiothoracic Vascular Surgery) | Admitting: Thoracic Surgery (Cardiothoracic Vascular Surgery)

## 2020-12-10 ENCOUNTER — Other Ambulatory Visit: Payer: Self-pay

## 2020-12-10 VITALS — BP 96/66 | HR 82 | Resp 20 | Ht 72.0 in | Wt 193.0 lb

## 2020-12-10 DIAGNOSIS — Z951 Presence of aortocoronary bypass graft: Secondary | ICD-10-CM

## 2020-12-10 MED ORDER — METOPROLOL TARTRATE 50 MG PO TABS: 50.0000 mg | ORAL_TABLET | Freq: Two times a day (BID) | ORAL | 1 refills | Status: DC

## 2020-12-10 NOTE — Patient Instructions (Signed)
Please take 50mg  BID of metoprolol. I will send a new prescription for this medication.   STOP Diltiazem for now  Continue Amiodarone 200mg  daily  F/u cards

## 2020-12-10 NOTE — Progress Notes (Signed)
[address]       [place] [address]             [phone]      Gerald Andrade is a 50 y.o. male patient who is s/p CABG x 4 on 5/5 with Dr. Cornelius Moras. He is here today for his routine follow-up visit. His hospital stay was complicated by Atrial fibrillation with RVR on POD 2 and converted with IV Amio. Despite having an open radial artery harvest, his Imdur was discontinued to allow titration of his metoprolol and the addition of Cardizem CD.    1. S/P CABG x 4    Past Medical History:  Diagnosis Date  . Coronary artery disease    . Fatty liver   . 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 Type II diabetes mellitus (HCC) 11/13/2020   No past surgical history pertinent negatives on file. Scheduled Meds: Current Outpatient Medications on File Prior to Visit  Medication Sig Dispense Refill  . albuterol (PROVENTIL HFA;VENTOLIN HFA) 108 (90 BASE) MCG/ACT inhaler Inhale 2 puffs into the lungs every 6 (six) hours as needed for wheezing or shortness of breath.    Marland Kitchen amiodarone (PACERONE) 200 MG tablet Take one tablet by mouth daily    . apixaban (ELIQUIS) 5 MG TABS tablet Take 1 tablet (5 mg total) by mouth 2 (two) times daily. 60 tablet 3  . aspirin EC 81 MG EC tablet Take 1 tablet (81 mg total) by mouth daily. Swallow whole. 30 tablet 11  . Cyanocobalamin (VITAMIN B 12 PO) Take 1 tablet by mouth daily.    . diazepam (VALIUM) 5 MG tablet Take 2 tablets (10 mg total) by mouth at bedtime as needed for anxiety. 60 tablet 0  . diltiazem (CARDIZEM CD) 120 MG 24 hr capsule Take 1 capsule (120 mg total) by mouth daily. 30 capsule 3  . diphenhydrAMINE (BENADRYL) 25 MG tablet Take 25 mg by mouth as needed for allergies.    Marland Kitchen glipiZIDE (GLUCOTROL) 10 MG tablet Take 1 tablet (10 mg total) by mouth 2 (two) times daily before a meal. 60 tablet 3  . metoprolol tartrate (LOPRESSOR) 100 MG tablet Take 1 tablet (100 mg total) by mouth 2 (two)  times daily. 60 tablet 3  . montelukast (SINGULAIR) 10 MG tablet Take 1 tablet by mouth at bedtime.    Marland Kitchen oxyCODONE (ROXICODONE) 5 MG immediate release tablet Take 1 tablet (5 mg total) by mouth every 4 (four) hours as needed for severe pain. 30 tablet 0  . potassium chloride SA (KLOR-CON) 20 MEQ tablet Take 1 tablet (20 mEq total) by mouth daily. 7 tablet 0  . rosuvastatin (CRESTOR) 40 MG tablet Take 1 tablet (40 mg total) by mouth daily. 30 tablet 3  . traMADol (ULTRAM) 50 MG tablet Take 1 tablet (50 mg total) by mouth every 6 (six) hours as needed for moderate pain. 30 tablet 0  . Vitamin D, Cholecalciferol, 25 MCG (1000 UT) TABS Take 1,000 Units by mouth daily.     No current facility-administered medications on file prior to visit.    Allergies  Allergen Reactions  . Shrimp [Shellfish Allergy] Swelling    Chest tighten    Active Problems:   * No active hospital problems. *  Blood pressure 96/66, pulse 82, resp. rate 20, height 6' (1.829 m), weight 193 lb (87.5 kg), SpO2 98 %.  Subjective Gerald Andrade is a 50 year old male patient who presented to the clinic today for his routine follow-up appointment.  He is status post coronary artery bypass grafting x4 with Dr. Cornelius Moras on 11/14/2020.  He states that he has been doing pretty well at home.  He has been checking his blood glucose and it has been around 110s.  His blood pressure has been running a little low at home however, he has not had any symptoms.  Objective   Cor: NSR, no murmur Pulm: CTA bilaterally and in all fields Ext: no edema Wound: sternal incision with a small amount of medial clear drainage, cleaned and dressed with sterile 2 x 2 dressing. Radial site is c/d/i   CXR: Small left pleural effusion   Assessment & Plan   Gerald Andrade is status post coronary artery bypass grafting x4 with Dr. Cornelius Moras on 11/14/2020.  He has been doing pretty good at home and has been getting around just fine without any significant shortness of  breath or chest pain.  Today in the clinic after removing his chest tube sutures the patient became sweaty, pale, and his blood pressure was 92/64.  We did check his blood glucose which was normal 130.  We offered him some cold ice water and laid him back in the chair and continue to take serial blood pressure readings about every 5 minutes.  This was the first time he had an incident like this however he did state that his blood pressure has been around 100 systolic consistently at home.  This did align with his recent A. fib clinic visit where he was 100/66 for his blood pressure reading that day.  I discussed the patient with Dr. Cornelius Moras and we decided to ultimately stop his diltiazem for now and change his metoprolol to 50 twice daily.  He is also on amiodarone 200 daily which she is to continue at this time.  He is on Eliquis and baby aspirin for his atrial fibrillation.  He has been in normal sinus rhythm and is in normal sinus rhythm on his exam today.  He still is requiring the occasional tramadol during the day for pain but mostly takes tramadol at night.  I asked him to wean to Tylenol during the day and the tramadol at night.  If he is able to do this, I have cleared him for driving as long as he feels up to it.  Certainly if he has any more incidences of hypotension like he did today in the clinic he is to notify us immediately.  Hopefully, with these medication changes he will feel better. Of note, he does have some left upper leg numbness around the IT band region. This is not associated with the vein harvest leg but it is something that started after being in the hospital. If this continues, I would have primary care address.   He is to follow-up with Dr. Jacinto Halim in 2 weeks.  We will arrange follow-up in our office in 2 months unless he has issues that need addressed before then.  Medication changes:  Stop diltiazem 120 mg daily Start to take metoprolol 50 mg twice daily-sent to pharmacy Continue  to take amiodarone 200 mg daily  Jari Favre, PA-C  Sharlene Dory 12/10/2020

## 2020-12-11 ENCOUNTER — Telehealth: Payer: Self-pay | Admitting: Physician Assistant

## 2020-12-11 NOTE — Telephone Encounter (Signed)
      [address]       [place] Andrade [id number]             [phone]       Called Gerald Andrade and he states he is doing better today. His father had an episode this morning and was transported via ambulance to the hospital. He has been stressed about that but recent blood pressure was 125 systolic.   We discussed wound care management and continued precautions. Encouraged to wash incision and pat dry with a clean towel. He is to slowly increase activity and slowly decrease tramadol use during the day before driving.   He will follow-up in the office with me in 2 months. He is welcome to come in before then if something new occurs. Our office will be in touch with an appointment.   Jari Favre, PA-C

## 2020-12-23 ENCOUNTER — Other Ambulatory Visit (HOSPITAL_COMMUNITY): Payer: Self-pay

## 2020-12-24 ENCOUNTER — Telehealth: Payer: Self-pay | Admitting: *Deleted

## 2020-12-24 NOTE — Telephone Encounter (Signed)
Returned phone call from last nights answering service report for patient c/o pain. Patient reports returning home from the beach last night and experiencing increased chest soreness. Per patient, he spoke with on call provider who suggested the traveling may have precipitated his pain. Patient states he took a tramadol but it does nothing for him. Advised patient to rest today and to contact our office if the pain increases or doesn't go away. Patient verbalizes understanding. No further questions at this time.

## 2020-12-25 ENCOUNTER — Encounter: Payer: Self-pay | Admitting: Cardiology

## 2020-12-25 ENCOUNTER — Other Ambulatory Visit: Payer: Self-pay

## 2020-12-25 ENCOUNTER — Ambulatory Visit: Payer: Self-pay | Admitting: Cardiology

## 2020-12-25 VITALS — BP 126/83 | HR 91 | Temp 98.2°F | Resp 17 | Ht 72.0 in | Wt 189.4 lb

## 2020-12-25 DIAGNOSIS — E119 Type 2 diabetes mellitus without complications: Secondary | ICD-10-CM

## 2020-12-25 DIAGNOSIS — G4701 Insomnia due to medical condition: Secondary | ICD-10-CM

## 2020-12-25 DIAGNOSIS — I480 Paroxysmal atrial fibrillation: Secondary | ICD-10-CM

## 2020-12-25 DIAGNOSIS — E7800 Pure hypercholesterolemia, unspecified: Secondary | ICD-10-CM

## 2020-12-25 DIAGNOSIS — Z951 Presence of aortocoronary bypass graft: Secondary | ICD-10-CM

## 2020-12-25 DIAGNOSIS — I25118 Atherosclerotic heart disease of native coronary artery with other forms of angina pectoris: Secondary | ICD-10-CM

## 2020-12-25 MED ORDER — DIAZEPAM 5 MG PO TABS: 10.0000 mg | ORAL_TABLET | Freq: Every evening | ORAL | 0 refills | Status: DC | PRN

## 2020-12-25 MED ORDER — ROSUVASTATIN CALCIUM 40 MG PO TABS: 40.0000 mg | ORAL_TABLET | Freq: Every day | ORAL | 3 refills | Status: DC

## 2020-12-25 MED ORDER — GLIPIZIDE 10 MG PO TABS: 10.0000 mg | ORAL_TABLET | Freq: Two times a day (BID) | ORAL | 3 refills | Status: DC

## 2020-12-25 NOTE — Progress Notes (Signed)
Primary Physician/Referring:  Leonard Downing, MD  Patient ID: Gerald Andrade, male    DOB: [birth year], 50 y.o.   MRN: [id number]  Chief Complaint  Patient presents with   follow up   cabg   HPI:    Gerald Andrade  is a 50 y.o. Caucasian male  with history of remote tobacco use, obesity, strong family history of premature coronary disease, admitted to the hospital with symptoms suggestive of unstable angina pectoris, new onset diabetes mellitus, remote tobacco use, admitted to the hospital on 11/12/2020, underwent cardiac catheterization the same morning and was found to have severe triple-vessel coronary artery disease for which he underwent CABG x4 on 11/14/20.  He had postop atrial fibrillation and was started on Eliquis and also amiodarone.  He now presents for office visit.  States that he is doing well, he has not had any recurrence of chest pain, symptoms of chest pain from operative site is also improved.  Diltiazem has been discontinued and he is continued on beta-blocker only in view of CAD although he has radial artery graft.  Accompanied by his wife.   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   LIMA to LAD, Free RIMA to PDA, LRA to OM2, SVG to Diag   Type II diabetes mellitus ([place]) 11/13/2020   Past Surgical History:  Procedure Laterality Date   CORONARY ARTERY BYPASS GRAFT N/A 11/14/2020   Procedure: CORONARY ARTERY BYPASS GRAFTING (CABG), ON PUMP, TIMES FOUR, USING BILATERAL INTERNAL MAMMARY ARTERIES, LEFT RADIAL ARTERY HARVESTED OPEN, AND RIGHT ENDOSCOPICALLY HARVESTED GREATER SAPHENOUS VEIN;  Surgeon: Rexene Alberts, MD;  Location: [place];  Service: Open Heart Surgery;  Laterality: N/A;   ENDOVEIN HARVEST OF GREATER SAPHENOUS VEIN Right 11/14/2020   Procedure: ENDOVEIN HARVEST OF GREATER SAPHENOUS VEIN;  Surgeon: Rexene Alberts, MD;  Location: [place];  Service: Open Heart Surgery;  Laterality:  Right;   LEFT HEART CATH AND CORONARY ANGIOGRAPHY N/A 11/12/2020   Procedure: LEFT HEART CATH AND CORONARY ANGIOGRAPHY;  Surgeon: Adrian Prows, MD;  Location: [place] [facility];  Service: Cardiovascular;  Laterality: N/A;   RADIAL ARTERY HARVEST Left 11/14/2020   Procedure: RADIAL ARTERY HARVEST;  Surgeon: Rexene Alberts, MD;  Location: [place];  Service: Open Heart Surgery;  Laterality: Left;   TEE WITHOUT CARDIOVERSION N/A 11/14/2020   Procedure: TRANSESOPHAGEAL ECHOCARDIOGRAM (TEE);  Surgeon: Rexene Alberts, MD;  Location: [place];  Service: Open Heart Surgery;  Laterality: N/A;   Family History  Problem Relation Age of Onset   CAD Mother    CAD Father     Social History   Tobacco Use   Smoking status: Former    Packs/day: 2.00    Years: 10.00    Pack years: 20.00    Types: Cigarettes    Quit date: 12/11/2000    Years since quitting: 20.0   Smokeless tobacco: Never  Substance Use Topics   Alcohol use: No   Marital Status: Married  ROS  Review of Systems  Cardiovascular:  Negative for chest pain, dyspnea on exertion and leg swelling.  Gastrointestinal:  Negative for melena.  Psychiatric/Behavioral:  The patient is nervous/anxious.   Objective  Blood pressure 126/83, pulse 91, temperature 98.2 F (36.8 C), temperature source Temporal, resp. rate 17, height 6' (1.829 m), weight 189 lb 6.4 oz (85.9 kg), SpO2 97 %. Body mass index is  25.69 kg/m.  Vitals with BMI 12/25/2020 12/10/2020 11/29/2020  Height 6' 0"  6' 0"  6' 0"   Weight 189 lbs 6 oz 193 lbs 197 lbs 13 oz  BMI 25.68 71.24 58.09  Systolic 983 96 382  Diastolic 83 66 60  Pulse 91 82 79     Physical Exam Neck:     Vascular: No carotid bruit or JVD.  Cardiovascular:     Rate and Rhythm: Normal rate and regular rhythm.     Pulses: Intact distal pulses.     Heart sounds: Normal heart sounds. No murmur heard.   No gallop.  Pulmonary:     Effort: Pulmonary effort is normal.     Breath sounds: Normal breath sounds.  Chest:      Comments: Thoracotomy scar has healed well Abdominal:     General: Bowel sounds are normal.     Palpations: Abdomen is soft.  Musculoskeletal:        General: No swelling.  Skin:    Capillary Refill: Capillary refill takes less than 2 seconds.     Laboratory examination:   Recent Labs    11/18/20 0513 11/19/20 0110 11/21/20 0145  NA 133* 134* 132*  K 4.4 3.8 4.6  CL 99 95* 101  CO2 28 28 23   GLUCOSE 210* 130* 106*  BUN 26* 19 13  CREATININE 0.94 0.92 0.85  CALCIUM 8.3* 8.1* 8.6*  GFRNONAA >60 >60 >60   CrCl cannot be calculated (Patient's most recent lab result is older than the maximum 21 days allowed.).  CMP Latest Ref Rng & Units 11/21/2020 11/19/2020 11/18/2020  Glucose 70 - 99 mg/dL 106(H) 130(H) 210(H)  BUN 6 - 20 mg/dL 13 19 26(H)  Creatinine 0.61 - 1.24 mg/dL 0.85 0.92 0.94  Sodium 135 - 145 mmol/L 132(L) 134(L) 133(L)  Potassium 3.5 - 5.1 mmol/L 4.6 3.8 4.4  Chloride 98 - 111 mmol/L 101 95(L) 99  CO2 22 - 32 mmol/L 23 28 28   Calcium 8.9 - 10.3 mg/dL 8.6(L) 8.1(L) 8.3(L)  Total Protein 6.5 - 8.1 g/dL - - -  Total Bilirubin 0.3 - 1.2 mg/dL - - -  Alkaline Phos 38 - 126 U/L - - -  AST 15 - 41 U/L - - -  ALT 0 - 44 U/L - - -   CBC Latest Ref Rng & Units 11/21/2020 11/17/2020 11/16/2020  WBC 4.0 - 10.5 K/uL 10.9(H) 17.7(H) 11.4(H)  Hemoglobin 13.0 - 17.0 g/dL 10.4(L) 10.9(L) 10.1(L)  Hematocrit 39.0 - 52.0 % 31.5(L) 32.7(L) 30.5(L)  Platelets 150 - 400 K/uL 348 215 131(L)    Lipid Panel Recent Labs    11/12/20 1501  CHOL 181  TRIG 100  LDLCALC 127*  VLDL 20  HDL 34*  CHOLHDL 5.3   Lipid Panel     Component Value Date/Time   CHOL 181 11/12/2020 1501   TRIG 100 11/12/2020 1501   HDL 34 (L) 11/12/2020 1501   CHOLHDL 5.3 11/12/2020 1501   VLDL 20 11/12/2020 1501   LDLCALC 127 (H) 11/12/2020 1501     HEMOGLOBIN A1C Lab Results  Component Value Date   HGBA1C 7.9 (H) 11/13/2020   MPG 180.03 11/13/2020   TSH Recent Labs    11/13/20 0611  11/19/20 1812  TSH 0.651 1.479   Medications and allergies   Allergies  Allergen Reactions   Shrimp [Shellfish Allergy] Swelling    Chest tighten     Medication prior to this encounter:   Outpatient Medications Prior to Visit  Medication  Sig Dispense Refill   albuterol (PROVENTIL HFA;VENTOLIN HFA) 108 (90 BASE) MCG/ACT inhaler Inhale 2 puffs into the lungs every 6 (six) hours as needed for wheezing or shortness of breath.     apixaban (ELIQUIS) 5 MG TABS tablet Take 1 tablet (5 mg total) by mouth 2 (two) times daily. 60 tablet 3   aspirin EC 81 MG EC tablet Take 1 tablet (81 mg total) by mouth daily. Swallow whole. 30 tablet 11   diphenhydrAMINE (BENADRYL) 25 MG tablet Take 25 mg by mouth as needed for allergies.     metoprolol tartrate (LOPRESSOR) 50 MG tablet Take 1 tablet (50 mg total) by mouth 2 (two) times daily. 60 tablet 1   traMADol (ULTRAM) 50 MG tablet Take 1 tablet (50 mg total) by mouth every 6 (six) hours as needed for moderate pain. 30 tablet 0   amiodarone (PACERONE) 200 MG tablet Take one tablet by mouth daily     diazepam (VALIUM) 5 MG tablet Take 2 tablets (10 mg total) by mouth at bedtime as needed for anxiety. 60 tablet 0   glipiZIDE (GLUCOTROL) 10 MG tablet Take 1 tablet (10 mg total) by mouth 2 (two) times daily before a meal. 60 tablet 3   rosuvastatin (CRESTOR) 40 MG tablet Take 1 tablet (40 mg total) by mouth daily. 30 tablet 3   Cyanocobalamin (VITAMIN B 12 Andrade) Take 1 tablet by mouth daily.     diltiazem (CARDIZEM CD) 120 MG 24 hr capsule Take 1 capsule (120 mg total) by mouth daily. 30 capsule 3   montelukast (SINGULAIR) 10 MG tablet Take 1 tablet by mouth at bedtime.     oxyCODONE (ROXICODONE) 5 MG immediate release tablet Take 1 tablet (5 mg total) by mouth every 4 (four) hours as needed for severe pain. 30 tablet 0   potassium chloride SA (KLOR-CON) 20 MEQ tablet Take 1 tablet (20 mEq total) by mouth daily. 7 tablet 0   Vitamin D, Cholecalciferol, 25 MCG  (1000 UT) TABS Take 1,000 Units by mouth daily.     No facility-administered medications prior to visit.     FINAL MEDICATION AS OF TODAY:   Medications after current encounter Current Outpatient Medications  Medication Instructions   albuterol (PROVENTIL HFA;VENTOLIN HFA) 108 (90 BASE) MCG/ACT inhaler 2 puffs, Inhalation, Every 6 hours PRN   apixaban (ELIQUIS) 5 mg, Oral, 2 times daily   aspirin 81 mg, Oral, Daily, Swallow whole.   diazepam (VALIUM) 10 mg, Oral, At bedtime PRN   diphenhydrAMINE (BENADRYL) 25 mg, Oral, As needed   glipiZIDE (GLUCOTROL) 10 mg, Oral, 2 times daily before meals   metoprolol tartrate (LOPRESSOR) 50 mg, Oral, 2 times daily   rosuvastatin (CRESTOR) 40 mg, Oral, Daily   traMADol (ULTRAM) 50 mg, Oral, Every 6 hours PRN    Radiology:   No results found.  Cardiac Studies:   Left Heart Catheterization 11/12/2020:  LV: Mild LV systolic dysfunction, EF 35% with inferior akinesis.  No significant mitral regurgitation.  Normal EDP.  No pressure gradient across the aortic valve. LM: Large-caliber vessel, normal.  Short. LAD: Large vessel in the proximal segment.  Proximal LAD has an ulcerated lesion.  There is origin of a very large D1 at the proximal segment and a large septal perforator at which point the LAD is occluded and has faint ipsilateral collaterals from the circumflex, diagonal and the SP1, and after reconstitution of the LAD, there is a focal 80% stenosis.  The diagonal 1 is very large  and has proximal high-grade 80 to 90% stenosis. CX: Large vessel, giving origin to small OM1 after which it is a CTO with bridging collaterals.  Distal circumflex also receives collaterals from the LAD.  Circumflex also gives collaterals to occluded RCA. RCA: It is occluded in the midsegment after the origin of RV branch.  Distal RCA including PL and PDA branches are large and are collateralized from circumflex and LAD.   Recommendation: Patient has complex multivessel  disease and would benefit from CABG with arterial conduits if possible including LIMA to LAD and diagonal and RIMA to RCA and probably a free radial graft to the circumflex.  Surgical consultation has been requested.  Patient needs to be admitted inpatient in view of complex coronary anatomy and high risk for cardiac events.      Pre-CABG vascular studies 11/13/2020: Carotid artery duplex and ABI revealed no abnormality with triphasic waveforms at the ankles.  Echocardiogram 11/12/2020:  1. Left ventricular ejection fraction, by estimation, is 55 to 60%. The left ventricle has normal function. The left ventricle has no regional wall motion abnormalities. Left ventricular diastolic parameters were normal.  2. Right ventricular systolic function is normal. The right ventricular size is normal.  3. The mitral valve is grossly normal. No evidence of mitral valve regurgitation.  4. The aortic valve is tricuspid. Aortic valve regurgitation is not visualized. EKG:    EKG 12/25/2020: Normal sinus rhythm at rate of 83 bpm, normal axis.  No evidence of ischemia, normal EKG.  Assessment     ICD-10-CM   1. Coronary artery disease of native artery of native heart with stable angina pectoris ([place])  I25.118 CMP14+EGFR    CBC    2. S/P CABG x 4  Z95.1     3. Paroxysmal atrial fibrillation (HCC)  I48.0 EKG 12-Lead    4. Hypercholesteremia  E78.00 Lipid Panel With LDL/HDL Ratio    LDL cholesterol, direct    Apo A1 + B + Ratio    Lipoprotein A (LPA)    rosuvastatin (CRESTOR) 40 MG tablet    5. Type 2 diabetes mellitus without complication, without long-term current use of insulin (HCC)  E11.9 glipiZIDE (GLUCOTROL) 10 MG tablet    6. Insomnia due to medical condition  G47.01 diazepam (VALIUM) 5 MG tablet       Medications Discontinued During This Encounter  Medication Reason   Cyanocobalamin (VITAMIN B 12 Andrade) Error   montelukast (SINGULAIR) 10 MG tablet Error   oxyCODONE (ROXICODONE) 5 MG immediate  release tablet Error   diltiazem (CARDIZEM CD) 120 MG 24 hr capsule Error   potassium chloride SA (KLOR-CON) 20 MEQ tablet Error   Vitamin D, Cholecalciferol, 25 MCG (1000 UT) TABS Error   amiodarone (PACERONE) 200 MG tablet Completed Course   rosuvastatin (CRESTOR) 40 MG tablet Reorder   diazepam (VALIUM) 5 MG tablet Reorder   glipiZIDE (GLUCOTROL) 10 MG tablet Reorder    Meds ordered this encounter  Medications   rosuvastatin (CRESTOR) 40 MG tablet    Sig: Take 1 tablet (40 mg total) by mouth daily.    Dispense:  30 tablet    Refill:  3   glipiZIDE (GLUCOTROL) 10 MG tablet    Sig: Take 1 tablet (10 mg total) by mouth 2 (two) times daily before a meal.    Dispense:  60 tablet    Refill:  3   diazepam (VALIUM) 5 MG tablet    Sig: Take 2 tablets (10 mg total) by mouth at  bedtime as needed for anxiety.    Dispense:  30 tablet    Refill:  0   Orders Placed This Encounter  Procedures   CMP14+EGFR   CBC   Lipid Panel With LDL/HDL Ratio   LDL cholesterol, direct   Apo A1 + B + Ratio   Lipoprotein A (LPA)   EKG 12-Lead   Recommendations:   Gerald Andrade is a 50 y.o. male  with history of remote tobacco use, obesity, strong family history of premature coronary disease, admitted to the hospital with symptoms suggestive of unstable angina pectoris, new onset diabetes mellitus, remote tobacco use, admitted to the hospital on 11/12/2020, underwent cardiac catheterization the same morning and was found to have severe triple-vessel coronary artery disease for which he underwent CABG x4 on 11/14/20.  He had postop atrial fibrillation and was started on Eliquis and also amiodarone.  He now presents for office visit.  States that he is doing well, he has not had any recurrence of chest pain, symptoms of chest pain from operative site is also improved.  Diltiazem has been discontinued and he is continued on beta-blocker only in view of CAD although he has radial artery graft.  Accompanied by  his wife.  No clinical evidence of heart failure, surgically has recuperated well.  Discontinue amiodarone, if he maintains sinus rhythm will discontinue Eliquis on his next visit in 4 weeks.  Refilled his prescription for glipizide, Valium and Crestor.  Patient has been on Valium due to postop amnesia and also his father is in hospice.  Do not plan on resuming it on a chronic basis.  Will obtain labs prior to his next office visit including CMP, CBC, lipids, LDL direct and ApoA-I: B ratio along with LPA although previous LPA was normal.  This was a 40-minute office visit encounter in reconciliation of his medical issues and evaluation of his hospital records.    Adrian Prows, MD, [facility] 12/25/2020, 10:35 PM Office: [phone]

## 2021-01-01 ENCOUNTER — Encounter: Payer: Self-pay | Admitting: Physician Assistant

## 2021-01-01 ENCOUNTER — Ambulatory Visit: Payer: Self-pay | Attending: Physician Assistant | Admitting: Physician Assistant

## 2021-01-01 ENCOUNTER — Other Ambulatory Visit: Payer: Self-pay

## 2021-01-01 DIAGNOSIS — I25110 Atherosclerotic heart disease of native coronary artery with unstable angina pectoris: Secondary | ICD-10-CM

## 2021-01-01 DIAGNOSIS — Z09 Encounter for follow-up examination after completed treatment for conditions other than malignant neoplasm: Secondary | ICD-10-CM

## 2021-01-01 DIAGNOSIS — Z951 Presence of aortocoronary bypass graft: Secondary | ICD-10-CM

## 2021-01-01 DIAGNOSIS — E1159 Type 2 diabetes mellitus with other circulatory complications: Secondary | ICD-10-CM

## 2021-01-01 DIAGNOSIS — E785 Hyperlipidemia, unspecified: Secondary | ICD-10-CM

## 2021-01-01 NOTE — Progress Notes (Signed)
Patient ID: Gerald Andrade, male   DOB: 11-Nov-1970, 50 y.o.   MRN: [id number]  AVirtual Visit via Telephone Note  I connected with Gerald Andrade on 01/01/21 at  2:30 PM EDT by telephone and verified that I am speaking with the correct person using two identifiers.  Location: Patient: home Provider: [facility]   I discussed the limitations, risks, security and privacy concerns of performing an evaluation and management service by telephone and the availability of in person appointments. I also discussed with the patient that there may be a patient responsible charge related to this service. The patient expressed understanding and agreed to proceed.   History of Present Illness: After hospitalization 5/3-5/13/2022 for CP. S/p CABG X4. Saw cardiology 12/25/2020 and labs have been ordered.  No CP.  He obtained a glucometer.  Blood sugars <150.  Compliant on all meds.  Appetite is good.  No SOB.  Has f/up with cardiology 7/19.  Does not need RF today.  Says previous PCP was a "small town doc" and wants to establish here.     History of Present Illness:   Patient is a 50 year old mildly obese male with no previous history of coronary artery disease but risk factors notable for history of hypertension and a strong family history of coronary artery disease.  He presents with greater than 54-month history of exertional shortness of breath and sudden onset substernal chest pain consistent with unstable angina pectoris.  He presented for evaluation on 5/4 after experiencing chest pain (middle of chest and did not radiate) that began at 1:30 am.  According to patient, he had mowed 4 yards Monday and tried to rest after that.  After the initial chest pain began, he took 2 puffs of Albuterol as he thought the cause might be from pollen, which did not really help. He then took ec asa 325 mg, which again, did not alleviate the chest pain. Finally, he took two 5 mg Valium and asked his wife to call [phone] as chest  pain continued.  Workup in the ED consisted of: EKG showed subtle ST elevation in lead V2 with minimal ST depression in lead II and III. His chest pain did improve with Nitroglycerin. Troponin I (high sensitivity) highest has been 20. CT scan of the chest was negative for PE but did show multifocal coronary calcification. Echo showed LVEF 55-60% and no significant valvular disease. He then underwent a cardiac catheterization which showed LVEF 45%, proximal LAD to Mid LAD lesion is 100% stenosed, 1st Diag lesion is 90% stenosed, proximal Circumflex to mid Circumflx lesion is 100% stenosed, and proximal RCA to Mid RCA lesion is 100% stenosed. A cardiothoracic consultation has been requested with Dr. Cornelius Moras who ultimately felt the patient would benefit from coronary artery bypass grafting procedure.  The risks and benefits of the procedure were explained to the patient and he was agreeable to proceed.   Hospital Course:   The patient had no further chest pain prior to surgery.  He was taken to the operating room on 11/14/2020.  He underwent CABG x 4 utilizing LIMA to LAD, SVG to Diagonal, Radial Artery to OM 2, and Free RIMA to PDA.  He underwent open harvest of left radial artery and endoscopic harvest of greater saphenous vein from his right thigh to below his knee.  He tolerated the procedure without difficulty and was taken to the SICU in stable condition.  The patient was extubated the evening of surgery without difficulty.  During his stay  in the SICU the patient was weaned off Neo-synephrine as hemodynamics allowed.  He was started on Imdur for his radial artery graft.  He chest tubes and arterial lines were removed without difficulty.  He had quite a bit of post operative pain and was treated with Toradol with good response.  He was in NSR and transferred to the progressive care unit on 11/16/2020.  He developed Atrial Fibrillation with RVR.  He was treated with IV Amiodarone drip for this.  He converted to NSR.   He was having difficulty with anxiety.  He was started on prn xanax for this.  He is a newly diagnosed diabetic.  His A1c was 7.9 on admission.  Diabetes education/coordinator were consulted.  They recommended starting Metformin at discharge.  However, patient states he has researched this and is unwilling to take this medication.  Repeat follow up with Coordinator it was recommended the patient start Glipizide 10 mg BID.  He remains in NSR.  His pacing wires were removed without difficulty.  He was diuresed for volume overload state.   Unfortunately the patient continued to experience PAF.  His Imdur was discontinued to allow titration of his Lopressor dose.  He was also initiated on Cardizem CD for additional HR control.  Finally he was started on Eliquis for stroke prophylaxis.  The patient converted to NSR.  His blood pressure was well controlled.  His blood sugars remain controlled with Glipizide.  He is ambulating without difficulty.  His surgical incisions are healing without evidence of infection.  He is medically stable for discharge home today.       Observations/Objective:  NAD>  A&Ox3   Assessment and Plan:  1. Coronary artery disease  Followed by cardiology and labs ordered by cardiology.    2. S/P CABG x 4 See #1.  No CP/SOB  3. Type 2 diabetes mellitus with other circulatory complication, without long-term current use of insulin (HCC) Continue glipizide 10mg  bid.  Verbalizes hypoglycemia plan.    4. Hyperlipidemia, unspecified hyperlipidemia type Continue crestor  5. Hospital discharge follow-up Doing well.  On valium for short term anxiety about health.  Will need safer options moving forward if his anxiety about health continues    Follow Up Instructions: Assign PCP in 2 months   I discussed the assessment and treatment plan with the patient. The patient was provided an opportunity to ask questions and all were answered. The patient agreed with the plan and demonstrated  an understanding of the instructions.   The patient was advised to call back or seek an in-person evaluation if the symptoms worsen or if the condition fails to improve as anticipated.  I provided 17 minutes of non-face-to-face time during this encounter.   , PA-C

## 2021-01-02 ENCOUNTER — Telehealth: Payer: Self-pay

## 2021-01-02 NOTE — Telephone Encounter (Signed)
**Note De-Identified Zyrell Carmean Obfuscation** Letter received Kalene Cutler fax from [facility] stating that they approved the pt for asst with Eliquis until 12/15/2021. BMSPAF Case #: [id number]

## 2021-01-28 ENCOUNTER — Ambulatory Visit: Payer: Self-pay | Admitting: Cardiology

## 2021-01-29 ENCOUNTER — Other Ambulatory Visit: Payer: Self-pay | Admitting: Physician Assistant

## 2021-02-07 ENCOUNTER — Other Ambulatory Visit: Payer: Self-pay | Admitting: Thoracic Surgery (Cardiothoracic Vascular Surgery)

## 2021-02-07 ENCOUNTER — Telehealth (HOSPITAL_COMMUNITY): Payer: Self-pay

## 2021-02-07 DIAGNOSIS — Z951 Presence of aortocoronary bypass graft: Secondary | ICD-10-CM

## 2021-02-07 NOTE — Telephone Encounter (Signed)
Called patient to see if he was interested in participating in the Cardiac Rehab Program. Patient stated yes to Virtual cardiac rehab only. Patient will come in for orientation on 02/18/2021@10 :30am.   Mailed package.

## 2021-02-10 ENCOUNTER — Ambulatory Visit
Admission: RE | Admit: 2021-02-10 | Discharge: 2021-02-10 | Disposition: A | Discharge status: Home / Self Care | Payer: Self-pay | Source: Ambulatory Visit | Attending: Surgery | Admitting: Surgery

## 2021-02-10 ENCOUNTER — Other Ambulatory Visit: Payer: Self-pay

## 2021-02-10 ENCOUNTER — Ambulatory Visit (INDEPENDENT_AMBULATORY_CARE_PROVIDER_SITE_OTHER): Payer: Self-pay | Admitting: Physician Assistant

## 2021-02-10 VITALS — BP 133/76 | HR 76 | Resp 20 | Wt 200.8 lb

## 2021-02-10 DIAGNOSIS — Z951 Presence of aortocoronary bypass graft: Secondary | ICD-10-CM

## 2021-02-10 MED ORDER — POTASSIUM CHLORIDE CRYS ER 20 MEQ PO TBCR: 20.0000 meq | EXTENDED_RELEASE_TABLET | Freq: Every day | ORAL | 0 refills | Status: DC

## 2021-02-10 MED ORDER — FUROSEMIDE 40 MG PO TABS: 40.0000 mg | ORAL_TABLET | Freq: Every day | ORAL | 0 refills | Status: DC

## 2021-02-10 NOTE — Progress Notes (Signed)
[address]       [place] [address]             [phone]        Gerald Andrade is a 50 y.o. male patient s/p CABG x 4 on 11/14/2020 with Dr. Cornelius Moras. In the hospital he did have some atrial fibrillation which was treated with Amio and Diltiazem. During his last office visit he became hypotensive, pale, and diaphoretic during a chest tube suture removal. We stopped his Diltiazem and lowered his metoprolol. He returns to the office for a follow-up visit.     1. 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 Scheduled Meds: Current Outpatient Medications on File Prior to Visit  Medication Sig Dispense Refill   albuterol (PROVENTIL HFA;VENTOLIN HFA) 108 (90 BASE) MCG/ACT inhaler Inhale 2 puffs into the lungs every 6 (six) hours as needed for wheezing or shortness of breath.     apixaban (ELIQUIS) 5 MG TABS tablet Take 1 tablet (5 mg total) by mouth 2 (two) times daily. 60 tablet 3   aspirin EC 81 MG EC tablet Take 1 tablet (81 mg total) by mouth daily. Swallow whole. 30 tablet 11   diazepam (VALIUM) 5 MG tablet Take 2 tablets (10 mg total) by mouth at bedtime as needed for anxiety. 30 tablet 0   diphenhydrAMINE (BENADRYL) 25 MG tablet Take 25 mg by mouth as needed for allergies.     glipiZIDE (GLUCOTROL) 10 MG tablet Take 1 tablet (10 mg total) by mouth 2 (two) times daily before a meal. 60 tablet 3   metoprolol tartrate (LOPRESSOR) 50 MG tablet Take 1 tablet (50 mg total) by mouth 2 (two) times daily. 60 tablet 1   rosuvastatin (CRESTOR) 40 MG tablet Take 1 tablet (40 mg total) by mouth daily. 30 tablet 3   traMADol (ULTRAM) 50 MG tablet Take 1 tablet (50 mg total) by mouth every 6 (six) hours as needed for moderate pain. 30 tablet  0   No current facility-administered medications on file prior to visit.     Allergies  Allergen Reactions   Shrimp [Shellfish Allergy] Swelling    Chest tighten    Active Problems:   * No active hospital problems. *  Blood pressure 133/76, pulse 76, resp. rate 20, weight 200 lb 12.8 oz (91.1 kg), SpO2 96 %.  CLINICAL DATA:  CABG.   EXAM: CHEST - 2 VIEW   COMPARISON:  12/10/2020 and CT chest 11/12/2020.   FINDINGS: Trachea is midline. Heart is enlarged, stable. Small to moderate left pleural effusion is loculated laterally and inferiorly. Minimal linear volume loss in the left lung base. Right lung is clear. No definite pneumothorax.   IMPRESSION: Small to moderate loculated left pleural effusion with minimal left basilar volume loss.     Electronically Signed   By: Gerald Andrade M.D.   On: 02/10/2021 13:10  Cor: NSR, no murmur Pulm: CTA bilaterally and in all fields Wound: clean and dry Ext: no edema  Subjective Objective: Vital signs (most recent): Blood pressure 133/76, pulse 76, resp. rate 20, weight 200 lb 12.8 oz (91.1 kg), SpO2 96 %.  Assessment & Plan  Gerald Andrade is a 50 year old male patient who returns to the office today status post coronary bypass grafting x4 on 11/14/2020.  He was brought back to the office today to check on his blood pressure and to make sure he is feeling better.  Last visit that I had with Gerald Andrade he had a near syncopal episode in the office and was hypotensive.  Today's blood pressure is much better.  He does state that occasionally it does get elevated at home and he takes an extra metoprolol 50 mg.  He did have a cardiology appointment however since he is uninsured at this time he canceled his appointment.  I do think that he would be okay to go back to light duty and needs to look into some health insurance.  It is very important that he follows up with cardiology for his hypertension.  He also has been going through some  personal issues with his dad passing away recently which has been really hard on him.  He does have folks to talk to about this.  Today he did have a slightly increased pleural effusion on chest x-ray.  Since his blood pressure has improved I decided to do 3 days of Lasix p.o. with potassium supplementation.  We will plan to see him next week in the office with a chest x-ray to reevaluate his pleural effusion.  If at that time it has not changed or gotten larger we may need to do a thoracentesis.  The patient is aware and would like to try to avoid this if possible.  Recommend follow-up in 1 week with a repeat chest x-ray to evaluate pleural effusion.  Sharlene Dory 02/10/2021

## 2021-02-11 ENCOUNTER — Telehealth: Payer: Self-pay | Admitting: Cardiology

## 2021-02-11 NOTE — Telephone Encounter (Signed)
Multiple attempts have been made to contact the patient to come to our office to be seen and evaluated.  Unable to leave voicemail as well, MyChart message has been sent.  Patient was evaluated by T CTS, Ms. Dagmar Hait, PA-C also tried to convince the patient regarding need for Cardiology F/U.   Yates Decamp, MD, [facility] 02/11/2021, 10:40 AM Office: [phone] Fax: [phone] Pager: [phone]

## 2021-02-13 ENCOUNTER — Encounter: Payer: Self-pay | Admitting: Student

## 2021-02-13 ENCOUNTER — Other Ambulatory Visit: Payer: Self-pay

## 2021-02-13 ENCOUNTER — Ambulatory Visit: Payer: Self-pay | Admitting: Student

## 2021-02-13 VITALS — BP 129/85 | HR 88 | Temp 98.2°F | Ht 72.0 in | Wt 199.0 lb

## 2021-02-13 DIAGNOSIS — I25118 Atherosclerotic heart disease of native coronary artery with other forms of angina pectoris: Secondary | ICD-10-CM

## 2021-02-13 DIAGNOSIS — I480 Paroxysmal atrial fibrillation: Secondary | ICD-10-CM

## 2021-02-13 MED ORDER — CLOPIDOGREL BISULFATE 75 MG PO TABS: 75.0000 mg | ORAL_TABLET | Freq: Every day | ORAL | 3 refills | Status: DC

## 2021-02-13 NOTE — Progress Notes (Signed)
Primary Physician/Referring:  Kaleen Mask, MD  Patient ID: Gerald Andrade, male    DOB: 29-Mar-1971, 50 y.o.   MRN: [id number]  Chief Complaint  Patient presents with   Atrial Fibrillation   Follow-up   Coronary Artery Disease   HPI:    Gerald Andrade  is a 50 y.o. Caucasian male  with history of remote tobacco use, obesity, strong family history of premature coronary disease, admitted to the hospital with symptoms suggestive of unstable angina pectoris, new onset diabetes mellitus, remote tobacco use, admitted to the hospital on 11/12/2020, underwent cardiac catheterization the same morning and was found to have severe triple-vessel coronary artery disease for which he underwent CABG x4 on 11/14/20.  He had postop atrial fibrillation and was started on Eliquis and also amiodarone.   Last office visit he had had no recurrence of atrial fibrillation and therefore amiodarone was discontinued, also discontinued.  Patient was seen by cardiothoracic surgery 02/10/2021 and chest x-ray noted small to moderate left pleural effusion.  Patient was therefore started on Lasix for 3 days with repeat chest x-ray and follow-up appointment with CT surgery scheduled for 02/17/2021.  Patient reports he was previously experiencing mild shortness of breath, which has improved with 3 days of Lasix.  He is otherwise doing well.  Denies chest pain, palpitations, syncope, near syncope, dizziness.  He continues to have numbness around sternotomy site.  He also has had low back pain since hospitalization, for which she plans to follow-up with his PCP at the end of this month.  Patient remains relatively inactive, however he starts cardiac rehab next week.  He is still in the process of applying for health insurance coverage.  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   LIMA to LAD, Free RIMA to PDA, LRA to OM2, SVG to Diag   Type II diabetes  mellitus (HCC) 11/13/2020   Past Surgical History:  Procedure Laterality Date   CORONARY ARTERY BYPASS GRAFT N/A 11/14/2020   Procedure: CORONARY ARTERY BYPASS GRAFTING (CABG), ON PUMP, TIMES FOUR, USING BILATERAL INTERNAL MAMMARY ARTERIES, LEFT RADIAL ARTERY HARVESTED OPEN, AND RIGHT ENDOSCOPICALLY HARVESTED GREATER SAPHENOUS VEIN;  Surgeon: Purcell Nails, MD;  Location: [facility] OR;  Service: Open Heart Surgery;  Laterality: N/A;   ENDOVEIN HARVEST OF GREATER SAPHENOUS VEIN Right 11/14/2020   Procedure: ENDOVEIN HARVEST OF GREATER SAPHENOUS VEIN;  Surgeon: Purcell Nails, MD;  Location: [facility] OR;  Service: Open Heart Surgery;  Laterality: Right;   LEFT HEART CATH AND CORONARY ANGIOGRAPHY N/A 11/12/2020   Procedure: LEFT HEART CATH AND CORONARY ANGIOGRAPHY;  Surgeon: Yates Decamp, MD;  Location: [facility] INVASIVE CV LAB;  Service: Cardiovascular;  Laterality: N/A;   RADIAL ARTERY HARVEST Left 11/14/2020   Procedure: RADIAL ARTERY HARVEST;  Surgeon: Purcell Nails, MD;  Location: [facility] OR;  Service: Open Heart Surgery;  Laterality: Left;   TEE WITHOUT CARDIOVERSION N/A 11/14/2020   Procedure: TRANSESOPHAGEAL ECHOCARDIOGRAM (TEE);  Surgeon: Purcell Nails, MD;  Location: [facility] OR;  Service: Open Heart Surgery;  Laterality: N/A;   Family History  Problem Relation Age of Onset   CAD Mother    CAD Father     Social History   Tobacco Use   Smoking status: Former    Packs/day: 2.00    Years: 10.00    Pack years: 20.00    Types: Cigarettes    Quit  date: 12/11/2000    Years since quitting: 20.1   Smokeless tobacco: Never  Substance Use Topics   Alcohol use: No   Marital Status: Married  ROS  Review of Systems  Cardiovascular:  Negative for chest pain, claudication, dyspnea on exertion, leg swelling, near-syncope, orthopnea, palpitations, paroxysmal nocturnal dyspnea and syncope.  Respiratory:  Negative for shortness of breath.   Neurological:  Negative for dizziness.  Psychiatric/Behavioral:  The patient is  nervous/anxious.   Objective  Blood pressure 129/85, pulse 88, temperature 98.2 F (36.8 C), height 6' (1.829 m), weight 199 lb (90.3 kg), SpO2 96 %. Body mass index is 26.99 kg/m.  Vitals with BMI 02/13/2021 02/10/2021 12/25/2020  Height 6\' 0"  - 6\' 0"   Weight 199 lbs 200 lbs 13 oz 189 lbs 6 oz  BMI 26.98 - 25.68  Systolic 129 133 [id number]  Diastolic 85 76 83  Pulse 88 76 91     Physical Exam Vitals reviewed.  Neck:     Vascular: No carotid bruit or JVD.  Cardiovascular:     Rate and Rhythm: Normal rate and regular rhythm.     Pulses: Intact distal pulses.     Heart sounds: Normal heart sounds. No murmur heard.   No gallop.  Pulmonary:     Effort: Pulmonary effort is normal.     Breath sounds: Normal breath sounds.  Chest:     Comments: Thoracotomy scar has healed well, small seroma noted Musculoskeletal:        General: No swelling.  Skin:    Capillary Refill: Capillary refill takes less than 2 seconds.  Neurological:     Mental Status: He is alert.     Laboratory examination:   Recent Labs    11/18/20 0513 11/19/20 0110 11/21/20 0145  NA 133* 134* 132*  K 4.4 3.8 4.6  CL 99 95* 101  CO2 28 28 23   GLUCOSE 210* 130* 106*  BUN 26* 19 13  CREATININE 0.94 0.92 0.85  CALCIUM 8.3* 8.1* 8.6*  GFRNONAA >60 >60 >60   CrCl cannot be calculated (Patient's most recent lab result is older than the maximum 21 days allowed.).  CMP Latest Ref Rng & Units 11/21/2020 11/19/2020 11/18/2020  Glucose 70 - 99 mg/dL 096(E106(H) 454(U130(H) 981(X210(H)  BUN 6 - 20 mg/dL 13 19 91(Y26(H)  Creatinine 0.61 - 1.24 mg/dL 7.820.85 9.560.92 2.130.94  Sodium 135 - 145 mmol/L 132(L) 134(L) 133(L)  Potassium 3.5 - 5.1 mmol/L 4.6 3.8 4.4  Chloride 98 - 111 mmol/L 101 95(L) 99  CO2 22 - 32 mmol/L 23 28 28   Calcium 8.9 - 10.3 mg/dL 0.8(M8.6(L) 8.1(L) 8.3(L)  Total Protein 6.5 - 8.1 g/dL - - -  Total Bilirubin 0.3 - 1.2 mg/dL - - -  Alkaline Phos 38 - 126 U/L - - -  AST 15 - 41 U/L - - -  ALT 0 - 44 U/L - - -   CBC Latest Ref Rng &  Units 11/21/2020 11/17/2020 11/16/2020  WBC 4.0 - 10.5 K/uL 10.9(H) 17.7(H) 11.4(H)  Hemoglobin 13.0 - 17.0 g/dL 10.4(L) 10.9(L) 10.1(L)  Hematocrit 39.0 - 52.0 % 31.5(L) 32.7(L) 30.5(L)  Platelets 150 - 400 K/uL 348 215 131(L)    Lipid Panel Recent Labs    11/12/20 1501  CHOL 181  TRIG 100  LDLCALC 127*  VLDL 20  HDL 34*  CHOLHDL 5.3   Lipid Panel     Component Value Date/Time   CHOL 181 11/12/2020 1501   TRIG 100 11/12/2020 1501  HDL 34 (L) 11/12/2020 1501   CHOLHDL 5.3 11/12/2020 1501   VLDL 20 11/12/2020 1501   LDLCALC 127 (H) 11/12/2020 1501     HEMOGLOBIN A1C Lab Results  Component Value Date   HGBA1C 7.9 (H) 11/13/2020   MPG 180.03 11/13/2020   TSH Recent Labs    11/13/20 0611 11/19/20 1812  TSH 0.651 1.479   Medications and allergies   Allergies  Allergen Reactions   Shrimp [Shellfish Allergy] Swelling    Chest tighten     Medication prior to this encounter:   Outpatient Medications Prior to Visit  Medication Sig Dispense Refill   albuterol (PROVENTIL HFA;VENTOLIN HFA) 108 (90 BASE) MCG/ACT inhaler Inhale 2 puffs into the lungs every 6 (six) hours as needed for wheezing or shortness of breath.     aspirin EC 81 MG EC tablet Take 1 tablet (81 mg total) by mouth daily. Swallow whole. 30 tablet 11   diazepam (VALIUM) 5 MG tablet Take 2 tablets (10 mg total) by mouth at bedtime as needed for anxiety. 30 tablet 0   diphenhydrAMINE (BENADRYL) 25 MG tablet Take 25 mg by mouth as needed for allergies.     furosemide (LASIX) 40 MG tablet Take 1 tablet (40 mg total) by mouth daily. 3 tablet 0   glipiZIDE (GLUCOTROL) 10 MG tablet Take 1 tablet (10 mg total) by mouth 2 (two) times daily before a meal. 60 tablet 3   metoprolol tartrate (LOPRESSOR) 50 MG tablet Take 1 tablet (50 mg total) by mouth 2 (two) times daily. 60 tablet 1   potassium chloride SA (KLOR-CON) 20 MEQ tablet Take 1 tablet (20 mEq total) by mouth daily. 3 tablet 0   rosuvastatin (CRESTOR) 40 MG  tablet Take 1 tablet (40 mg total) by mouth daily. 30 tablet 3   apixaban (ELIQUIS) 5 MG TABS tablet Take 1 tablet (5 mg total) by mouth 2 (two) times daily. 60 tablet 3   traMADol (ULTRAM) 50 MG tablet Take 1 tablet (50 mg total) by mouth every 6 (six) hours as needed for moderate pain. 30 tablet 0   No facility-administered medications prior to visit.     FINAL MEDICATION AS OF TODAY:   Medications after current encounter Current Outpatient Medications  Medication Instructions   albuterol (PROVENTIL HFA;VENTOLIN HFA) 108 (90 BASE) MCG/ACT inhaler 2 puffs, Inhalation, Every 6 hours PRN   aspirin 81 mg, Oral, Daily, Swallow whole.   clopidogrel (PLAVIX) 75 mg, Oral, Daily   diazepam (VALIUM) 10 mg, Oral, At bedtime PRN   diphenhydrAMINE (BENADRYL) 25 mg, Oral, As needed   furosemide (LASIX) 40 mg, Oral, Daily   glipiZIDE (GLUCOTROL) 10 mg, Oral, 2 times daily before meals   metoprolol tartrate (LOPRESSOR) 50 mg, Oral, 2 times daily   potassium chloride SA (KLOR-CON) 20 MEQ tablet 20 mEq, Oral, Daily   rosuvastatin (CRESTOR) 40 mg, Oral, Daily    Radiology:   No results found.  Cardiac Studies:   Left Heart Catheterization 11/12/2020:  LV: Mild LV systolic dysfunction, EF 45% with inferior akinesis.  No significant mitral regurgitation.  Normal EDP.  No pressure gradient across the aortic valve. LM: Large-caliber vessel, normal.  Short. LAD: Large vessel in the proximal segment.  Proximal LAD has an ulcerated lesion.  There is origin of a very large D1 at the proximal segment and a large septal perforator at which point the LAD is occluded and has faint ipsilateral collaterals from the circumflex, diagonal and the SP1, and after reconstitution of  the LAD, there is a focal 80% stenosis.  The diagonal 1 is very large and has proximal high-grade 80 to 90% stenosis. CX: Large vessel, giving origin to small OM1 after which it is a CTO with bridging collaterals.  Distal circumflex also  receives collaterals from the LAD.  Circumflex also gives collaterals to occluded RCA. RCA: It is occluded in the midsegment after the origin of RV branch.  Distal RCA including PL and PDA branches are large and are collateralized from circumflex and LAD.   Recommendation: Patient has complex multivessel disease and would benefit from CABG with arterial conduits if possible including LIMA to LAD and diagonal and RIMA to RCA and probably a free radial graft to the circumflex.  Surgical consultation has been requested.  Patient needs to be admitted inpatient in view of complex coronary anatomy and high risk for cardiac events.      Pre-CABG vascular studies 11/13/2020: Carotid artery duplex and ABI revealed no abnormality with triphasic waveforms at the ankles.  Echocardiogram 11/12/2020:  1. Left ventricular ejection fraction, by estimation, is 55 to 60%. The left ventricle has normal function. The left ventricle has no regional wall motion abnormalities. Left ventricular diastolic parameters were normal.  2. Right ventricular systolic function is normal. The right ventricular size is normal.  3. The mitral valve is grossly normal. No evidence of mitral valve regurgitation.  4. The aortic valve is tricuspid. Aortic valve regurgitation is not visualized. EKG:  EKG 02/13/2021: Normal sinus rhythm at rate of 84 bpm, normal axis. Left atrial enlargement.  No evidence of ischemia, normal EKG.  EKG 12/25/2020: Normal sinus rhythm at rate of 83 bpm, normal axis.  No evidence of ischemia, normal EKG.  Assessment     ICD-10-CM   1. Paroxysmal atrial fibrillation (HCC)  I48.0 EKG 12-Lead    2. 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    Dispense:  90 tablet    Refill:  3   Orders Placed This Encounter  Procedures   EKG 12-Lead   Recommendations:   Hasten Sweitzer is a 50 y.o. male  with history of remote tobacco use, obesity, strong family history of premature coronary disease, admitted to the hospital with symptoms suggestive of unstable angina pectoris, new onset diabetes mellitus, remote tobacco use, admitted to the hospital on 11/12/2020, underwent cardiac catheterization the same morning and was found to have severe triple-vessel coronary artery disease for which he underwent CABG x4 on 11/14/20.  He had postop atrial fibrillation and was started on Eliquis and also amiodarone. Amiodarone was discontinued at lasts visit as patient has had no known recurrence of atrial fib.   Patient is feeling well without specific complaints today, he has no recurrence of angina and there is no clinical evidence of heart failure. He has upcoming appointment to establish care with a new PCP and he is in the process of applying for health insurance coverage. Patient is encouraged to start cardiac rehab next week as scheduled, he will be doing this remotely through their app program.   Patient  is maintaining sinus rhythm without known recurrence of atrial fibrillation. Therefore discussed with patient indication, risks, benefits of discontinuing anticoagulation, patient verbalized understanding. Shared decision was to stop Eliquis. Will therefore add Plavix 75 mg once daily and continue aspirin. Will defer further lab testing to PCP at upcoming appointment.   In regard to pleural effusion, patient is scheduled for follow up with CT surgery in 4 days, will defer further management to them. Patient is otherwise stable from a cardiovascular standpoint.   Follow up in 6 months, sooner if needed, for CAD.   Patient was seen in  collaboration with Dr. Jacinto Halim and he is in agreement with the plan.     Rayford Halsted, PA-C 02/14/2021, 8:10 AM Office: [phone]

## 2021-02-17 ENCOUNTER — Other Ambulatory Visit: Payer: Self-pay

## 2021-02-17 ENCOUNTER — Ambulatory Visit (INDEPENDENT_AMBULATORY_CARE_PROVIDER_SITE_OTHER): Payer: Self-pay | Admitting: Physician Assistant

## 2021-02-17 ENCOUNTER — Other Ambulatory Visit: Payer: Self-pay | Admitting: Thoracic Surgery (Cardiothoracic Vascular Surgery)

## 2021-02-17 ENCOUNTER — Ambulatory Visit
Admission: RE | Admit: 2021-02-17 | Discharge: 2021-02-17 | Disposition: A | Discharge status: Home / Self Care | Payer: Self-pay | Source: Ambulatory Visit | Attending: Surgery | Admitting: Surgery

## 2021-02-17 VITALS — BP 120/81 | HR 85 | Resp 20 | Ht 72.0 in | Wt 202.0 lb

## 2021-02-17 DIAGNOSIS — Z951 Presence of aortocoronary bypass graft: Secondary | ICD-10-CM

## 2021-02-17 MED ORDER — FUROSEMIDE 40 MG PO TABS: 40.0000 mg | ORAL_TABLET | Freq: Every day | ORAL | 0 refills | Status: DC

## 2021-02-17 MED ORDER — POTASSIUM CHLORIDE CRYS ER 20 MEQ PO TBCR
20.0000 meq | EXTENDED_RELEASE_TABLET | Freq: Every day | ORAL | 0 refills | Status: DC
Start: 2021-02-17 — End: 2021-04-16

## 2021-02-17 NOTE — Progress Notes (Addendum)
  HPI: Patient returns for follow up of left pleural effusion. Patient had a CABG x 4 by Dr. Cornelius Moras on 11/14/2020. He was last seen by Jari Favre PA-C on 02/10/2021. She gave him 3 days of Lasix and potassium for left pleural effusion seen on CXR. He has also seen C. Cantwell PA-C from cardiology on 02/13/2021. Because he has continued to maintain sinus rhythm, Apixaban was stopped and he was started on Plavix 75 mg daily. He was also continued on baby ec asa 81 mg daily. Per patient, he feels better since having taken Lasix. He denies shortness of breath or chest pain.  Current Outpatient Medications  Medication Sig Dispense Refill   albuterol (PROVENTIL HFA;VENTOLIN HFA) 108 (90 BASE) MCG/ACT inhaler Inhale 2 puffs into the lungs every 6 (six) hours as needed for wheezing or shortness of breath.     aspirin EC 81 MG EC tablet Take 1 tablet (81 mg total) by mouth daily. Swallow whole. 30 tablet 11   clopidogrel (PLAVIX) 75 MG tablet Take 1 tablet (75 mg total) by mouth daily. 90 tablet 3   diazepam (VALIUM) 5 MG tablet Take 2 tablets (10 mg total) by mouth at bedtime as needed for anxiety. 30 tablet 0   diphenhydrAMINE (BENADRYL) 25 MG tablet Take 25 mg by mouth as needed for allergies.     furosemide (LASIX) 40 MG tablet Take 1 tablet (40 mg total) by mouth daily. 3 tablet 0   glipiZIDE (GLUCOTROL) 10 MG tablet Take 1 tablet (10 mg total) by mouth 2 (two) times daily before a meal. 60 tablet 3   metoprolol tartrate (LOPRESSOR) 50 MG tablet Take 1 tablet (50 mg total) by mouth 2 (two) times daily. 60 tablet 1   potassium chloride SA (KLOR-CON) 20 MEQ tablet Take 1 tablet (20 mEq total) by mouth daily. 3 tablet 0   rosuvastatin (CRESTOR) 40 MG tablet Take 1 tablet (40 mg total) by mouth daily. 30 tablet 3   Vitals:   02/17/21 1354  BP: 120/81  Pulse: 85  Resp: 20  SpO2: 100%       Physical Exam: CV-RRR Pulmonary-Clear to auscultation bilaterally; left basilar breath sounds only slightly  diminished Extremities-No LE edema   Diagnostic Tests: Narrative & Impression  CLINICAL DATA:  Status post coronary bypass graft.   EXAM: CHEST - 2 VIEW   COMPARISON:  February 10, 2021.   FINDINGS: Stable cardiomediastinal silhouette. No pneumothorax is noted. Right lung is clear. Stable loculated left pleural effusion is noted. Bony thorax is unremarkable.   IMPRESSION: Stable loculated left pleural effusion.     Electronically Signed   By: Lupita Raider M.D.   On: 02/17/2021 14:13    Impression and Plan: Mr. Boley and I discussed either trying another course of Lasix and potassium or having IR (as an outpatient) do a left thoracentesis. Per patient, he would like to try Lasix and potassium again. I did tell him that taking another week of Lasix may not decrease fluid (as is likely loculated), and although he understands this, he wants to try another trial of diuretics. Seven days of both Lasix and potassium were emailed to his pharmacy. Patient will return to the office with a PA/LAT CXR next Monday. He was instructed to call sooner if he has increased shortness of breath or has any other concerns.     Ardelle Balls, PA-C [facility] [phone]

## 2021-02-18 ENCOUNTER — Encounter (HOSPITAL_COMMUNITY)
Admission: RE | Admit: 2021-02-18 | Discharge: 2021-02-18 | Disposition: A | Discharge status: Home / Self Care | Payer: Self-pay | Source: Ambulatory Visit | Attending: Cardiology | Admitting: Cardiology

## 2021-02-18 ENCOUNTER — Encounter (HOSPITAL_COMMUNITY): Payer: Self-pay

## 2021-02-18 VITALS — BP 122/72 | HR 85 | Ht 71.25 in | Wt 201.3 lb

## 2021-02-18 DIAGNOSIS — Z951 Presence of aortocoronary bypass graft: Secondary | ICD-10-CM | POA: Insufficient documentation

## 2021-02-18 HISTORY — DX: Hyperlipidemia, unspecified: E78.5

## 2021-02-18 NOTE — Progress Notes (Signed)
Patient is participating in the virtual cardiac rehab program via the [facility] app. Reviewed home exercise guidelines with patient including endpoints, temperature precautions, target heart rate and rate of perceived exertion. Patient is currently walking 15-20 minutes, 3 days/week as his mode of home exercise. Discussed increasing duration to 30 minutes, and patient is amenable to this.Patient is looking into getting a smart watch to monitor his pulse with exercise. Patient voices understanding of instructions given.  Artist Pais, MS, ACSM CEP 02/18/2021 1243

## 2021-02-18 NOTE — Progress Notes (Signed)
Gerald Andrade was able to download the virtual [facility] APP without difficulty and shown how to navigate on the APP. Telemetry rhythm Sinus with occasional PVC's. Patient asymptomatic.Will fax exercise flow sheets to Dr. Verl Dicker office for review.Gladstone Lighter, RN,BSN 02/18/2021 2:52 PM

## 2021-02-18 NOTE — Progress Notes (Signed)
Cardiac Individual Treatment Plan  Patient Details  Name: Gerald Andrade MRN: [id number] Date of Birth: 04/11/1971 Referring Provider:   Flowsheet Row CARDIAC REHAB PHASE II ORIENTATION from 02/18/2021 in [facility]  Referring Provider Yates DecampGanji, Jay, MD       Initial Encounter Date:  Flowsheet Row CARDIAC REHAB PHASE II ORIENTATION [facility]18/2021 [facility]  Date 02/18/21       Visit Diagnosis: 11/14/20 CABG x 4  Patient's Home Medications on Admission:  Current Outpatient Medications:    albuterol (PROVENTIL HFA;VENTOLIN HFA) 108 (90 BASE) MCG/ACT inhaler, Inhale 2 puffs into the lungs every 6 (six) hours as needed for wheezing or shortness of breath., Disp: , Rfl:    aspirin EC 81 MG EC tablet, Take 1 tablet (81 mg total) by mouth daily. Swallow whole., Disp: 30 tablet, Rfl: 11   diazepam (VALIUM) 5 MG tablet, Take 2 tablets (10 mg total) by mouth at bedtime as needed for anxiety., Disp: 30 tablet, Rfl: 0   diphenhydrAMINE (BENADRYL) 25 MG tablet, Take 25 mg by mouth as needed for allergies., Disp: , Rfl:    glipiZIDE (GLUCOTROL) 10 MG tablet, Take 1 tablet (10 mg total) by mouth 2 (two) times daily before a meal., Disp: 60 tablet, Rfl: 3   metoprolol tartrate (LOPRESSOR) 50 MG tablet, Take 1 tablet (50 mg total) by mouth 2 (two) times daily., Disp: 60 tablet, Rfl: 1   rosuvastatin (CRESTOR) 40 MG tablet, Take 1 tablet (40 mg total) by mouth daily., Disp: 30 tablet, Rfl: 3   clopidogrel (PLAVIX) 75 MG tablet, Take 1 tablet (75 mg total) by mouth daily., Disp: 90 tablet, Rfl: 3   furosemide (LASIX) 40 MG tablet, Take 1 tablet (40 mg total) by mouth daily. For one week then stop., Disp: 7 tablet, Rfl: 0   potassium chloride SA (KLOR-CON) 20 MEQ tablet, Take 1 tablet (20 mEq total) by mouth daily. 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   LIMA to LAD, Free RIMA to PDA, LRA to OM2, SVG to Diag   Type II diabetes mellitus (HCC) 11/13/2020    Tobacco Use: Social History   Tobacco Use  Smoking Status Former   Packs/day: 2.00   Years: 10.00   Pack years: 20.00   Types: Cigarettes   Quit date: 12/11/2000   Years since quitting: 20.2  Smokeless Tobacco Never    Labs: Recent Review Flowsheet Data     Labs for ITP Cardiac and Pulmonary Rehab Latest Ref Rng & Units 11/14/2020 11/14/2020 11/14/2020 11/14/2020 11/14/2020   Cholestrol 0 - 200 mg/dL - - - - -   LDLCALC 0 - 99 mg/dL - - - - -   HDL >72>40 mg/dL - - - - -   Trlycerides <150 mg/dL - - - - -   Hemoglobin A1c 4.8 - 5.6 % - - - - -   PHART 7.350 - 7.450 7.305(L) - 7.315(L) 7.301(L) 7.314(L)   PCO2ART 32.0 - 48.0 mmHg 48.2(H) - 46.5 43.6 41.4   HCO3 20.0 - 28.0 mmol/L 24.0 - 23.6 21.3 20.7   TCO2 22 - 32 mmol/L 25 24 25 23 22    ACIDBASEDEF 0.0 - 2.0 mmol/L 2.0 - 3.0(H) 5.0(H) 5.0(H)   O2SAT %  99.0 - 99.0 96.0 95.0       Capillary Blood Glucose: Lab Results  Component Value Date   GLUCAP 138 (H) 11/22/2020   GLUCAP 104 (H) 11/21/2020   GLUCAP 74 11/21/2020   GLUCAP 109 (H) 11/21/2020   GLUCAP 121 (H) 11/21/2020     Exercise Target Goals: Exercise Program Goal: Individual exercise prescription set using results from initial 6 min walk test and THRR while considering  patient's activity barriers and safety.   Exercise Prescription Goal: Initial exercise prescription builds to 30-45 minutes a day of aerobic activity, 2-3 days per week.  Home exercise guidelines will be given to patient during program as part of exercise prescription that the participant will acknowledge.  Activity Barriers & Risk Stratification:  Activity Barriers & Cardiac Risk Stratification - 02/18/21 1119       Activity Barriers & Cardiac Risk Stratification   Activity Barriers None    Cardiac Risk  Stratification High             6 Minute Walk:  6 Minute Walk     Row Name 02/18/21 1153         6 Minute Walk   Phase Initial     Distance 1614 feet     Walk Time 6 minutes     # of Rest Breaks 0     MPH 3.06     METS 4.5     RPE 11     Perceived Dyspnea  0     VO2 Peak 15.76     Symptoms Yes (comment)     Comments Patient c/o feeling lightheaded at the end of the walk test secondary to wearing mask. Symptoms resolved with rest.     Resting HR 85 bpm     Resting BP 122/72     Resting Oxygen Saturation  97 %     Exercise Oxygen Saturation  during 6 min walk 99 %     Max Ex. HR 87 bpm     Max Ex. BP 138/88     2 Minute Post BP 108/70              Oxygen Initial Assessment:   Oxygen Re-Evaluation:   Oxygen Discharge (Final Oxygen Re-Evaluation):   Initial Exercise Prescription:  Initial Exercise Prescription - 02/18/21 1200       Date of Initial Exercise RX and Referring Provider   Date 02/18/21    Referring Provider Yates Decamp, MD    Expected Discharge Date 04/22/21      Track   Minutes 30      Prescription Details   Frequency (times per week) 5-7    Duration Progress to 30 minutes of continuous aerobic without signs/symptoms of physical distress      Intensity   THRR 40-80% of Max Heartrate 68-136    Ratings of Perceived Exertion 11-13    Perceived Dyspnea 0-4      Progression   Progression Continue to progress workloads to maintain intensity without signs/symptoms of physical distress.      Resistance Training   Training Prescription Yes             Perform Capillary Blood Glucose checks as needed.  Exercise Prescription Changes:   Exercise Comments:   Exercise Comments     Row Name 02/18/21 1119           Exercise Comments Reviewed home exercise guidelines with patient.  Exercise Goals and Review:   Exercise Goals     Row Name 02/18/21 1052             Exercise Goals   Increase Physical  Activity Yes       Intervention Provide advice, education, support and counseling about physical activity/exercise needs.;Develop an individualized exercise prescription for aerobic and resistive training based on initial evaluation findings, risk stratification, comorbidities and participant's personal goals.       Expected Outcomes Long Term: Exercising regularly at least 3-5 days a week.;Short Term: Attend rehab on a regular basis to increase amount of physical activity.;Long Term: Add in home exercise to make exercise part of routine and to increase amount of physical activity.       Increase Strength and Stamina Yes       Intervention Provide advice, education, support and counseling about physical activity/exercise needs.;Develop an individualized exercise prescription for aerobic and resistive training based on initial evaluation findings, risk stratification, comorbidities and participant's personal goals.       Expected Outcomes Short Term: Increase workloads from initial exercise prescription for resistance, speed, and METs.;Short Term: Perform resistance training exercises routinely during rehab and add in resistance training at home;Long Term: Improve cardiorespiratory fitness, muscular endurance and strength as measured by increased METs and functional capacity ( )       Able to understand and use rate of perceived exertion (RPE) scale Yes       Intervention Provide education and explanation on how to use RPE scale       Expected Outcomes Short Term: Able to use RPE daily in rehab to express subjective intensity level;Long Term:  Able to use RPE to guide intensity level when exercising independently       Knowledge and understanding of Target Heart Rate Range (THRR) Yes       Intervention Provide education and explanation of THRR including how the numbers were predicted and where they are located for reference       Expected Outcomes Short Term: Able to state/look up THRR;Long Term: Able  to use THRR to govern intensity when exercising independently;Short Term: Able to use daily as guideline for intensity in rehab       Able to check pulse independently Yes       Intervention Provide education and demonstration on how to check pulse in carotid and radial arteries.;Review the importance of being able to check your own pulse for safety during independent exercise       Expected Outcomes Short Term: Able to explain why pulse checking is important during independent exercise;Long Term: Able to check pulse independently and accurately       Understanding of Exercise Prescription Yes       Intervention Provide education, explanation, and written materials on patient's individual exercise prescription       Expected Outcomes Short Term: Able to explain program exercise prescription;Long Term: Able to explain home exercise prescription to exercise independently                Exercise Goals Re-Evaluation :   Discharge Exercise Prescription (Final Exercise Prescription Changes):   Nutrition:  Target Goals: Understanding of nutrition guidelines, daily intake of sodium 1500mg , cholesterol 200mg , calories 30% from fat and 7% or less from saturated fats, daily to have 5 or more servings of fruits and vegetables.  Biometrics:  Pre Biometrics - 02/18/21 1051       Pre Biometrics   Waist Circumference 39 inches  Hip Circumference 40 inches    Waist to Hip Ratio 0.98 %    Triceps Skinfold 15 mm    % Body Fat 26.2 %    Grip Strength 43.5 kg    Flexibility 8.25 in    Single Leg Stand 10.12 seconds              Nutrition Therapy Plan and Nutrition Goals:   Nutrition Assessments:  MEDIFICTS Score Key: ?70 Need to make dietary changes  40-70 Heart Healthy Diet ? 40 Therapeutic Level Cholesterol Diet    Picture Your Plate Scores: <40 Unhealthy dietary pattern with much room for improvement. 41-50 Dietary pattern unlikely to meet recommendations for good health  and room for improvement. 51-60 More healthful dietary pattern, with some room for improvement.  >60 Healthy dietary pattern, although there may be some specific behaviors that could be improved.    Nutrition Goals Re-Evaluation:   Nutrition Goals Re-Evaluation:   Nutrition Goals Discharge (Final Nutrition Goals Re-Evaluation):   Psychosocial: Target Goals: Acknowledge presence or absence of significant depression and/or stress, maximize coping skills, provide positive support system. Participant is able to verbalize types and ability to use techniques and skills needed for reducing stress and depression.  Initial Review & Psychosocial Screening:  Initial Psych Review & Screening - 02/18/21 1355       Initial Review   Current issues with Current Stress Concerns    Source of Stress Concerns Financial    Comments Orvilla Fus has not been able to work since having open heart surgery in May 22 and reports having stress due to finances      Family Dynamics   Good Support System? Yes   Orvilla Fus has his wife for support.     Barriers   Psychosocial barriers to participate in program The patient should benefit from training in stress management and relaxation.      Screening Interventions   Interventions To provide support and resources with identified psychosocial needs    Expected Outcomes Long Term Goal: Stressors or current issues are controlled or eliminated.             Quality of Life Scores:  Scores of 19 and below usually indicate a poorer quality of life in these areas.  A difference of  2-3 points is a clinically meaningful difference.  A difference of 2-3 points in the total score of the Quality of Life Index has been associated with significant improvement in overall quality of life, self-image, physical symptoms, and general health in studies assessing change in quality of life.  PHQ-9: Recent Review Flowsheet Data     Depression screen [facility] 2/9 02/18/2021   Decreased  Interest 0    Down, Depressed, Hopeless 0   PHQ - 2 Score 0      Interpretation of Total Score  Total Score Depression Severity:  1-4 = Minimal depression, 5-9 = Mild depression, 10-14 = Moderate depression, 15-19 = Moderately severe depression, 20-27 = Severe depression   Psychosocial Evaluation and Intervention:   Psychosocial Re-Evaluation:   Psychosocial Discharge (Final Psychosocial Re-Evaluation):   Vocational Rehabilitation: Provide vocational rehab assistance to qualifying candidates.   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 Participant will state understanding/return demonstration of topics presented.  Learning Barriers/Preferences:  Learning Barriers/Preferences - 02/18/21 1559       Learning Barriers/Preferences   Learning Barriers None    Learning Preferences Video;Pictoral             Education Topics: Count Your Pulse:  -Group instruction provided by verbal instruction, demonstration, patient participation and written materials to support subject.  Instructors address importance of being able to find your pulse and how to count your pulse when at home without a heart monitor.  Patients get hands on experience counting their pulse with staff help and individually.   Heart Attack, Angina, and Risk Factor Modification:  -Group instruction provided by verbal instruction, video, and written materials to support subject.  Instructors address signs and symptoms of angina and heart attacks.    Also discuss risk factors for heart disease and how to make changes to improve heart health risk  factors.   Functional Fitness:  -Group instruction provided by verbal instruction, demonstration, patient participation, and written materials to support subject.  Instructors address safety measures for doing things around the house.  Discuss how to get up and down off the floor, how to pick things up properly, how to safely get out of a chair without assistance, and balance training.   Meditation and Mindfulness:  -Group instruction provided by verbal instruction, patient participation, and written materials to support subject.  Instructor addresses importance of mindfulness and meditation practice to help reduce stress and improve awareness.  Instructor also leads participants through a meditation exercise.    Stretching for Flexibility and Mobility:  -Group instruction provided by verbal instruction, patient participation, and written materials to support subject.  Instructors lead participants through series of stretches that are designed to increase flexibility thus improving mobility.  These stretches are additional exercise for major muscle groups that are typically performed during regular warm up and cool down.   Hands Only CPR:  -Group verbal, video, and participation provides a basic overview of AHA guidelines for community CPR. Role-play of emergencies allow participants the opportunity to practice calling for help and chest compression technique with discussion of AED use.   Hypertension: -Group verbal and written instruction that provides a basic overview of hypertension including the most recent diagnostic guidelines, risk factor reduction with self-care instructions and medication management.    Nutrition I class: Heart Healthy Eating:  -Group instruction provided by PowerPoint slides, verbal discussion, and written materials to support subject matter. The instructor gives an explanation and review of the Therapeutic Lifestyle Changes diet recommendations, which includes a  discussion on lipid goals, dietary fat, sodium, fiber, plant stanol/sterol esters, sugar, and the components of a well-balanced, healthy diet.   Nutrition II class: Lifestyle Skills:  -Group instruction provided by PowerPoint slides, verbal discussion, and written materials to support subject matter. The instructor gives an explanation and review of label reading, grocery shopping for heart health, heart healthy recipe modifications, and ways to make healthier choices when eating out.   Diabetes Question & Answer:  -Group instruction provided by PowerPoint slides, verbal discussion, and written materials to support subject matter. The instructor gives an explanation and review of diabetes co-morbidities, pre- and post-prandial blood glucose goals, pre-exercise blood glucose goals, signs, symptoms, and treatment of hypoglycemia and hyperglycemia, and foot care basics.   Diabetes Blitz:  -Group instruction provided by [employer], verbal discussion, and written  materials to support subject matter. The instructor gives an explanation and review of the physiology behind type 1 and type 2 diabetes, diabetes medications and rational behind using different medications, pre- and post-prandial blood glucose recommendations and Hemoglobin A1c goals, diabetes diet, and exercise including blood glucose guidelines for exercising safely.    Portion Distortion:  -Group instruction provided by PowerPoint slides, verbal discussion, written materials, and food models to support subject matter. The instructor gives an explanation of serving size versus portion size, changes in portions sizes over the last 20 years, and what consists of a serving from each food group.   Stress Management:  -Group instruction provided by verbal instruction, video, and written materials to support subject matter.  Instructors review role of stress in heart disease and how to cope with stress positively.     Exercising on Your  Own:  -Group instruction provided by verbal instruction, power point, and written materials to support subject.  Instructors discuss benefits of exercise, components of exercise, frequency and intensity of exercise, and end points for exercise.  Also discuss use of nitroglycerin and activating EMS.  Review options of places to exercise outside of rehab.  Review guidelines for sex with heart disease.   Cardiac Drugs I:  -Group instruction provided by verbal instruction and written materials to support subject.  Instructor reviews cardiac drug classes: antiplatelets, anticoagulants, beta blockers, and statins.  Instructor discusses reasons, side effects, and lifestyle considerations for each drug class.   Cardiac Drugs II:  -Group instruction provided by verbal instruction and written materials to support subject.  Instructor reviews cardiac drug classes: angiotensin converting enzyme inhibitors (ACE-I), angiotensin II receptor blockers (ARBs), nitrates, and calcium channel blockers.  Instructor discusses reasons, side effects, and lifestyle considerations for each drug class.   Anatomy and Physiology of the Circulatory System:  Group verbal and written instruction and models provide basic cardiac anatomy and physiology, with the coronary electrical and arterial systems. Review of: AMI, Angina, Valve disease, Heart Failure, Peripheral Artery Disease, Cardiac Arrhythmia, Pacemakers, and the ICD.   Other Education:  -Group or individual verbal, written, or video instructions that support the educational goals of the cardiac rehab program.   Holiday Eating Survival Tips:  -Group instruction provided by PowerPoint slides, verbal discussion, and written materials to support subject matter. The instructor gives patients tips, tricks, and techniques to help them not only survive but enjoy the holidays despite the onslaught of food that accompanies the holidays.   Knowledge Questionnaire  Score:   Core Components/Risk Factors/Patient Goals at Admission:  Personal Goals and Risk Factors at Admission - 02/18/21 1244       Core Components/Risk Factors/Patient Goals on Admission    Weight Management Yes    Intervention Weight Management: Develop a combined nutrition and exercise program designed to reach desired caloric intake, while maintaining appropriate intake of nutrient and fiber, sodium and fats, and appropriate energy expenditure required for the weight goal.;Weight Management: Provide education and appropriate resources to help participant work on and attain dietary goals.    Expected Outcomes Short Term: Continue to assess and modify interventions until short term weight is achieved;Long Term: Adherence to nutrition and physical activity/exercise program aimed toward attainment of established weight goal;Weight Maintenance: Understanding of the daily nutrition guidelines, which includes 25-35% calories from fat, 7% or less cal from saturated fats, less than 200mg  cholesterol, less than 1.5gm of sodium, & 5 or more servings of fruits and vegetables daily;Understanding recommendations for meals to include 15-35% energy  as protein, 25-35% energy from fat, 35-60% energy from carbohydrates, less than 200mg  of dietary cholesterol, 20-35 gm of total fiber daily;Understanding of distribution of calorie intake throughout the day with the consumption of 4-5 meals/snacks    Diabetes Yes    Intervention Provide education about signs/symptoms and action to take for hypo/hyperglycemia.;Provide education about proper nutrition, including hydration, and aerobic/resistive exercise prescription along with prescribed medications to achieve blood glucose in normal ranges: Fasting glucose 65-99 mg/dL    Expected Outcomes Short Term: Participant verbalizes understanding of the signs/symptoms and immediate care of hyper/hypoglycemia, proper foot care and importance of medication, aerobic/resistive  exercise and nutrition plan for blood glucose control.;Long Term: Attainment of HbA1C < 7%.    Hypertension Yes    Intervention Provide education on lifestyle modifcations including regular physical activity/exercise, weight management, moderate sodium restriction and increased consumption of fresh fruit, vegetables, and low fat dairy, alcohol moderation, and smoking cessation.;Monitor prescription use compliance.    Expected Outcomes Short Term: Continued assessment and intervention until BP is < 140/74mm HG in hypertensive participants. < 130/60mm HG in hypertensive participants with diabetes, heart failure or chronic kidney disease.;Long Term: Maintenance of blood pressure at goal levels.    Lipids Yes    Intervention Provide education and support for participant on nutrition & aerobic/resistive exercise along with prescribed medications to achieve LDL 70mg , HDL >40mg .    Expected Outcomes Short Term: Participant states understanding of desired cholesterol values and is compliant with medications prescribed. Participant is following exercise prescription and nutrition guidelines.;Long Term: Cholesterol controlled with medications as prescribed, with individualized exercise RX and with personalized nutrition plan. Value goals: LDL < 70mg , HDL > 40 mg.    Stress Yes    Intervention Offer individual and/or small group education and counseling on adjustment to heart disease, stress management and health-related lifestyle change. Teach and support self-help strategies.;Refer participants experiencing significant psychosocial distress to appropriate mental health specialists for further evaluation and treatment. When possible, include family members and significant others in education/counseling sessions.    Expected Outcomes Short Term: Participant demonstrates changes in health-related behavior, relaxation and other stress management skills, ability to obtain effective social support, and compliance with  psychotropic medications if prescribed.;Long Term: Emotional wellbeing is indicated by absence of clinically significant psychosocial distress or social isolation.             Core Components/Risk Factors/Patient Goals Review:    Core Components/Risk Factors/Patient Goals at Discharge (Final Review):    ITP Comments:  ITP Comments     Row Name 02/18/21 1051           ITP Comments Medical Director- Dr. , MD                Comments: Patient attended orientation for the cardiac rehabilitation program on 02/19/2021 to review rules and guidelines for the program. Patient will be participating in the virtual cardiac rehab program via the [facility] app. Completed 6-minute walk test, Initial ITP, and exercise prescription. Vital signs stable. Telemetry- Normal sinus rhythm with occasional PVCs. Patient c/o feeling lightheaded briefly at the end walk test, which he felt was due to wearing mask. Safety measures and social distancing in place per CDC guidelines  04/21/2021, MS, ACSM CEP

## 2021-02-18 NOTE — Progress Notes (Signed)
Cardiac Rehab Medication Review by a Nurse  Does the patient  feel that his/her medications are working for him/her?  yes  Has the patient been experiencing any side effects to the medications prescribed?  no  Does the patient measure his/her own blood pressure or blood glucose at home?  yes   Does the patient have any problems obtaining medications due to transportation or finances?   yes  Understanding of regimen: good Understanding of indications: good Potential of compliance: good    Nurse comments: Gerald Andrade is taking his medications as prescribed and has a good understanding of what his medications are for. Gerald Andrade says he and his wife have limited income since he has been out of work. Gerald Andrade checks his CBG's and blood pressures at home. Medication list updated today.    Arta Bruce [facility] RN 02/18/2021 11:29 AM

## 2021-02-18 NOTE — Progress Notes (Signed)
Pt is interested in participating in Virtual Cardiac and Pulmonary Rehab. Pt advised that Virtual Cardiac and Pulmonary Rehab is provided at no cost to the patient.  Checklist:  Pt has smart device  ie smartphone and/or ipad for downloading an app  Yes Reliable internet/wifi service    Yes Understands how to use their smartphone and navigate within an app.  Yes   Pt verbalized understanding and is in agreement.            Confirm Consent - In the setting of the current Covid19 crisis, you are scheduled for a phone visit with your Cardiac or Pulmonary team member.  Just as we do with many in-gym visits, in order for you to participate in this visit, we must obtain consent.  If you'd like, I can send this to your mychart (if signed up) or email for you to review.  Otherwise, I can obtain your verbal consent now.  By agreeing to a telephone visit, we'd like you to understand that the technology does not allow for your Cardiac or Pulmonary Rehab team member to perform a physical assessment, and thus may limit their ability to fully assess your ability to perform exercise programs. If your provider identifies any concerns that need to be evaluated in person, we will make arrangements to do so.  Finally, though the technology is pretty good, we cannot assure that it will always work on either your or our end and we cannot ensure that we have a secure connection.  Cardiac and Pulmonary Rehab Telehealth visits and "At Home" cardiac and pulmonary rehab are provided at no cost to you.        Are you willing to proceed?"        STAFF: Did the patient verbally acknowledge consent to telehealth visit? Document YES/NO here: Yes     Gladstone Lighter RN  Cardiac and Pulmonary Rehab Staff        Date 02/18/21    @ Time 11:27

## 2021-02-21 ENCOUNTER — Other Ambulatory Visit: Payer: Self-pay | Admitting: Thoracic Surgery (Cardiothoracic Vascular Surgery)

## 2021-02-21 DIAGNOSIS — Z951 Presence of aortocoronary bypass graft: Secondary | ICD-10-CM

## 2021-02-24 ENCOUNTER — Other Ambulatory Visit: Payer: Self-pay

## 2021-02-24 ENCOUNTER — Other Ambulatory Visit: Payer: Self-pay | Admitting: Surgery

## 2021-02-24 ENCOUNTER — Encounter: Payer: Self-pay | Admitting: Physician Assistant

## 2021-02-24 ENCOUNTER — Ambulatory Visit
Admission: RE | Admit: 2021-02-24 | Discharge: 2021-02-24 | Disposition: A | Discharge status: Home / Self Care | Payer: Self-pay | Source: Ambulatory Visit | Attending: Surgery | Admitting: Surgery

## 2021-02-24 ENCOUNTER — Ambulatory Visit (INDEPENDENT_AMBULATORY_CARE_PROVIDER_SITE_OTHER): Payer: Self-pay | Admitting: Physician Assistant

## 2021-02-24 VITALS — BP 123/82 | HR 79 | Resp 20 | Ht 71.0 in | Wt 200.0 lb

## 2021-02-24 DIAGNOSIS — J90 Pleural effusion, not elsewhere classified: Secondary | ICD-10-CM

## 2021-02-24 DIAGNOSIS — Z951 Presence of aortocoronary bypass graft: Secondary | ICD-10-CM

## 2021-02-24 NOTE — Patient Instructions (Signed)
-  Our office will schedule left ultrasound-guided thoracentesis by interventional radiology  -Follow-up as needed

## 2021-02-24 NOTE — Progress Notes (Signed)
HPI: [name] is status post CABG x4 by Dr. Cornelius Andrade on 11/14/2020.  His postoperative course was notable for atrial fibrillation which has since resolved.  He has been taken off of his amiodarone and apixaban.  He has been noted to have a small left pleural effusion since his first postoperative visit on 531.  This is been treated conservatively with diuresis with minimal change in fusion radiographically.  He has never complained of pain or shortness of breath associated with this finding.  He was last seen in our office a week ago and was offered referral for drainage by interventional radiology versus repeating a course of oral Lasix.  He elected the latter option.  He returns today for scheduled follow-up after having completed 1 week course of Lasix 40 mg p.o. daily along with potassium supplement.  He reports no change in his breathing which actually has not been very difficult since surgery.  He does notice some brief episodes of shortness of breath when he changes positions in bed that he thinks may be related to this effusion.  He denies any pain.  He explains that he is most limited by pain in his sacrum when walking.  This is intermittent.  He plans to see his primary care physician about this.    Current Outpatient Medications  Medication Sig Dispense Refill   albuterol (PROVENTIL HFA;VENTOLIN HFA) 108 (90 BASE) MCG/ACT inhaler Inhale 2 puffs into the lungs every 6 (six) hours as needed for wheezing or shortness of breath.     aspirin EC 81 MG EC tablet Take 1 tablet (81 mg total) by mouth daily. Swallow whole. 30 tablet 11   clopidogrel (PLAVIX) 75 MG tablet Take 1 tablet (75 mg total) by mouth daily. 90 tablet 3   diazepam (VALIUM) 5 MG tablet Take 2 tablets (10 mg total) by mouth at bedtime as needed for anxiety. 30 tablet 0   diphenhydrAMINE (BENADRYL) 25 MG tablet Take 25 mg by mouth as needed for allergies.     furosemide (LASIX) 40 MG tablet Take 1 tablet (40 mg total) by mouth  daily. For one week then stop. 7 tablet 0   glipiZIDE (GLUCOTROL) 10 MG tablet Take 1 tablet (10 mg total) by mouth 2 (two) times daily before a meal. 60 tablet 3   metoprolol tartrate (LOPRESSOR) 50 MG tablet Take 1 tablet (50 mg total) by mouth 2 (two) times daily. 60 tablet 1   potassium chloride SA (KLOR-CON) 20 MEQ tablet Take 1 tablet (20 mEq total) by mouth daily. For one week then stop. 7 tablet 0   rosuvastatin (CRESTOR) 40 MG tablet Take 1 tablet (40 mg total) by mouth daily. 30 tablet 3   No current facility-administered medications for this visit.    Physical Exam  Heart-regular rate and rhythm Chest-chest x-ray today shows similar opacity in the left gutter consistent with pleural effusion.  This is not changed significantly since he was in the office last week.   Diagnostic Tests:  CLINICAL DATA:  Post CABG x 4   EXAM: CHEST - 2 VIEW   COMPARISON:  02/17/2021   FINDINGS: Borderline enlargement of cardiac silhouette post CABG.   Mediastinal contours and pulmonary vascularity normal.   Persistent partially loculated LEFT pleural effusion and basilar atelectasis.   Remaining lungs clear.   No pneumothorax or acute osseous findings.   IMPRESSION: Post CABG with persistent partially loculated LEFT pleural effusion and basilar atelectasis.     Electronically Signed  By: Gerald Andrade M.D.   On: 02/24/2021 14:58   Impression / Plan: Small but persistent left pleural effusion in a 50 year old male who is now 3 months status post coronary bypass grafting.  This is not responded to diuresis.  He has been reluctant to proceed with thoracentesis as has been advised at previous visits.  I explained that given his young age, it would be prudent to attempt to get this resolved to avoid entrapment and loss of function in the segment of his left lung.  He agrees to ultrasound-guided thoracentesis by interventional radiology.  We will arrange this.    Gerald Roca, PA-C [facility] [phone]

## 2021-02-24 NOTE — Progress Notes (Signed)
Patient returns for follow up of left pleural effusion. Patient had a CABG x 4 by Dr. Cornelius Moras on 11/14/2020. He was last seen by Jari Favre PA-C on 02/10/2021. She gave him 3 days of Lasix and potassium for left pleural effusion seen on CXR. He has also seen C. Cantwell PA-C from cardiology on 02/13/2021. Because he has continued to maintain sinus rhythm, Apixaban was stopped and he was started on Plavix 75 mg daily. He was also continued on baby ec asa 81 mg daily. Per patient, he feels better since having taken Lasix. He denies shortness of breath or chest pain.

## 2021-02-27 ENCOUNTER — Ambulatory Visit (HOSPITAL_COMMUNITY)
Admission: RE | Admit: 2021-02-27 | Discharge: 2021-02-27 | Disposition: A | Discharge status: Home / Self Care | Payer: Self-pay | Source: Ambulatory Visit | Attending: Student | Admitting: Student

## 2021-02-27 ENCOUNTER — Ambulatory Visit (HOSPITAL_COMMUNITY)
Admission: RE | Admit: 2021-02-27 | Discharge: 2021-02-27 | Disposition: A | Discharge status: Home / Self Care | Payer: Self-pay | Source: Ambulatory Visit | Attending: Surgery | Admitting: Surgery

## 2021-02-27 ENCOUNTER — Other Ambulatory Visit: Payer: Self-pay

## 2021-02-27 DIAGNOSIS — J90 Pleural effusion, not elsewhere classified: Secondary | ICD-10-CM | POA: Insufficient documentation

## 2021-02-27 DIAGNOSIS — Z951 Presence of aortocoronary bypass graft: Secondary | ICD-10-CM | POA: Insufficient documentation

## 2021-02-27 DIAGNOSIS — Z9889 Other specified postprocedural states: Secondary | ICD-10-CM

## 2021-02-27 HISTORY — PX: IR THORACENTESIS ASP PLEURAL SPACE W/IMG GUIDE: IMG5380

## 2021-02-27 MED ORDER — LIDOCAINE HCL 1 % IJ SOLN
INTRAMUSCULAR | Status: AC
  Filled 2021-02-27: qty 20

## 2021-02-27 NOTE — Procedures (Signed)
PROCEDURE SUMMARY:  Successful US guided left thoracentesis. Yielded 325 ml of light red fluid. Pt tolerated procedure well. No immediate complications.  CXR ordered; no post-procedure pneumothorax identified.  EBL < 2 mL  Mickie Kay, NP 02/27/2021 10:39 AM

## 2021-03-03 ENCOUNTER — Encounter (HOSPITAL_COMMUNITY)
Admission: RE | Admit: 2021-03-03 | Discharge: 2021-03-03 | Disposition: A | Discharge status: Home / Self Care | Payer: Self-pay | Source: Ambulatory Visit | Attending: Cardiology | Admitting: Cardiology

## 2021-03-03 NOTE — Progress Notes (Signed)
Cardiac Rehab: Virtual Visit  Patient participates in the virtual cardiac rehab program via the [facility] app. Patient has been walking about 10-15 minutes most days of the week. Patient has achieved about 30 minutes of walking about once/week. Patient recently got an Apple Watch, which he has used to monitor his pulse with exercise. Patient had an issue with sacrum pain that limited his exercise but not in the past few days. Patient also had some shortness of breath when walking in high humidity. Reminded patient to walk indoors if humidity is greater than 75% and patient is agreeable to this. Patient states he had fluid drained on Thursday and hasn't gotten his energy back yet. He is using his incentive spirometer. Patient states he's trying to take it day by day, and not overdo it with exercise. Patient hasn't been logging his walking in the app because he's been very busy of late but plans to use it more. I encouraged patient to view the educational videos on risk factor modification as well as logging exercise, and patient is amenable to this. Will follow-up with patient in a couple of weeks.  Artist Pais, MS, ACSM [employer] 03/03/21 [phone]

## 2021-03-06 ENCOUNTER — Other Ambulatory Visit: Payer: Self-pay

## 2021-03-06 ENCOUNTER — Encounter (HOSPITAL_COMMUNITY)
Admission: RE | Admit: 2021-03-06 | Discharge: 2021-03-06 | Disposition: A | Discharge status: Home / Self Care | Payer: Self-pay | Source: Ambulatory Visit | Attending: Cardiology | Admitting: Cardiology

## 2021-03-06 NOTE — Progress Notes (Signed)
Gerald Andrade 50 y.o. 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   Current Outpatient Medications:    albuterol (PROVENTIL HFA;VENTOLIN HFA) 108 (90 BASE) MCG/ACT inhaler, Inhale 2 puffs into the lungs every 6 (six) hours as needed for wheezing or shortness of breath., Disp: , Rfl:    aspirin EC 81 MG EC tablet, Take 1 tablet (81 mg total) by mouth daily. Swallow whole., Disp: 30 tablet, Rfl: 11   clopidogrel (PLAVIX) 75 MG tablet, Take 1 tablet (75 mg total) by mouth daily., Disp: 90 tablet, Rfl: 3   diazepam (VALIUM) 5 MG tablet, Take 2 tablets (10 mg total) by mouth at bedtime as needed for anxiety., Disp: 30 tablet, Rfl: 0   diphenhydrAMINE (BENADRYL) 25 MG tablet, Take 25 mg by mouth as needed for allergies., Disp: , Rfl:    furosemide (LASIX) 40 MG tablet, Take 1 tablet (40 mg total) by mouth daily. For one week then stop., Disp: 7 tablet, Rfl: 0   glipiZIDE (GLUCOTROL) 10 MG tablet, Take 1 tablet (10 mg total) by mouth 2 (two) times daily before a meal., Disp: 60 tablet, Rfl: 3   metoprolol tartrate (LOPRESSOR) 50 MG tablet, Take 1 tablet (50 mg total) by mouth 2 (two) times daily., Disp: 60 tablet, Rfl: 1   potassium chloride SA (KLOR-CON) 20 MEQ tablet, Take 1 tablet (20 mEq total) by mouth daily. For one week then stop., Disp: 7 tablet, Rfl: 0   rosuvastatin (CRESTOR) 40 MG tablet, Take 1 tablet (40 mg total) by mouth daily., Disp: 30 tablet, Rfl: 3   Ht Readings from Last 1 Encounters:  02/24/21 5\' 11"  (1.803 m)     Wt Readings from Last 3 Encounters:  02/24/21 200 lb (90.7 kg)  02/18/21 201 lb 4.5 oz (91.3 kg)  02/17/21 202 lb (91.6 kg)     There is no height or weight on file to calculate BMI.   Social  History   Tobacco Use  Smoking Status Former   Packs/day: 2.00   Years: 10.00   Pack years: 20.00   Types: Cigarettes   Quit date: 12/11/2000   Years since quitting: 20.2  Smokeless Tobacco Never     Lab Results  Component Value Date   CHOL 181 11/12/2020   Lab Results  Component Value Date   HDL 34 (L) 11/12/2020   Lab Results  Component Value Date   LDLCALC 127 (H) 11/12/2020   Lab Results  Component Value Date   TRIG 100 11/12/2020     Lab Results  Component Value Date   HGBA1C 7.9 (H) 11/13/2020     CBG (last 3)  No results for input(s): GLUCAP in the last 72 hours.   Nutrition Note  Spoke with pt. Nutrition Plan and Nutrition Survey goals reviewed with pt. Pt has made some diet changes since coming home from the hospital. He is interested in making more diet changes.   Pt has Type 2 Diabetes. Last A1c indicates blood glucose well-controlled. This 01/13/2021 went over Diabetes Education test results. Pt checks CBG's 2 times a day. Fasting CBG's reportedly 90's mg/dL and pm CBGs are Clinical research associate mg/dl. He has not  had any recent hypoglycemia episodes. We reviewed glipizide's MOA. He knows to eat consistently/not skip meals. We discussed balanced meals using plate method.   Per discussion, pt has reduced sodium intake by reducing/avoiding salt shaker. He does read labels occasionally. He notices high sodium foods and avoids sometimes. His appetite recently went back to norma and he is working towards trying to purchase foods that are lower in sodium. Pt does use canned/convenience foods often. Pt does not add salt to food. Pt does not eat out frequently.  Reviewed lipid panel with pt. Noted LDL 127 mg/dl.  Pt expressed understanding of the information reviewed.    Nutrition Diagnosis Inappropriate intake of saturated fats related to food related knowledge deficit as evidenced by pt's diet recall of using butter and eating potted meat/hot dogs/red meat and LDL value of 127  mg/dl  Nutrition Intervention Pt's individual nutrition plan reviewed with pt. Benefits of adopting Heart Healthy diet discussed when Picture Your Plate reviewed.  Pt given handouts for: ? Nutrition I class ? Nutrition II class ? Diabetes Plate Method Continue client-centered nutrition education by RD, as part of interdisciplinary care.  Goal(s)  Pt to cut down on meats high in saturated fat. Pt to use heart healthy fats Improved blood glucose control as evidenced by pt's A1c trending from 7.9 toward less than 7.0.   Plan:  Will provide client-centered nutrition education as part of interdisciplinary care Monitor and evaluate progress toward nutrition goal with team.   Andrey Campanile, MS, RDN, LDN

## 2021-03-10 ENCOUNTER — Other Ambulatory Visit: Payer: Self-pay

## 2021-03-10 ENCOUNTER — Telehealth: Payer: Self-pay

## 2021-03-10 MED ORDER — ATORVASTATIN CALCIUM 80 MG PO TABS: 80.0000 mg | ORAL_TABLET | Freq: Every day | ORAL | 1 refills | Status: DC

## 2021-03-10 NOTE — Telephone Encounter (Signed)
Done

## 2021-03-10 NOTE — Telephone Encounter (Signed)
Can switch to atorvastatin 80 mg once daily. Please send for this

## 2021-03-10 NOTE — Telephone Encounter (Signed)
Patient requesting lower cost statin.  Atorvastatin is $31  Rosuvastatin is $71, which pt is taking currently

## 2021-03-11 ENCOUNTER — Other Ambulatory Visit: Payer: Self-pay

## 2021-03-11 ENCOUNTER — Ambulatory Visit (HOSPITAL_BASED_OUTPATIENT_CLINIC_OR_DEPARTMENT_OTHER): Payer: Self-pay | Admitting: Family Medicine

## 2021-03-11 DIAGNOSIS — Z951 Presence of aortocoronary bypass graft: Secondary | ICD-10-CM

## 2021-03-11 DIAGNOSIS — E1159 Type 2 diabetes mellitus with other circulatory complications: Secondary | ICD-10-CM

## 2021-03-11 DIAGNOSIS — E785 Hyperlipidemia, unspecified: Secondary | ICD-10-CM

## 2021-03-11 NOTE — Progress Notes (Signed)
,  Virtual Visit via Telephone Note  I connected with Gerald Andrade, on 03/11/2021 at 3:39 PM by telephone due to the COVID-19 pandemic and verified that I am speaking with the correct person using two identifiers.   Consent: I discussed the limitations, risks, security and privacy concerns of performing an evaluation and management service by telephone and the availability of in person appointments. I also discussed with the patient that there may be a patient responsible charge related to this service. The patient expressed understanding and agreed to proceed.   Location of Patient: Home  Location of Provider: Clinic   Persons participating in Telemedicine visit: Jeffrie Lofstrom Dr. Alvis Lemmings     History of Present Illness: Gerald Andrade is a 50 y.o. 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 fib (taken off amiodarone and apixaban), hypertension who presents today to establish care.   2 weeks ago he was seen by cardiac surgery and scheduled for thoracocentesis due to persistent small left pleural effusion. He felt worse after his thoracocentesis on 02/25/2021 but then subsequently felt better. He is currently undergoing Cardiac Rehab. He had not been checking his blood sugars lately due to running out of test strips.  He currently has no medical coverage and obtains his medications [facility] [address].  Review of his medications reveal he has atorvastatin listed on his med list however the patient informs me he is on Crestor.  Cardiology notes reveal a switch from Crestor to atorvastatin due to cost but the patient denies making this request. He is compliant with all his other medications even though he has a high co-pay.  States he previously worked until 10/2020 when he had his cardiac surgery.  He denies additional concerns today. 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     aspirin EC 81 MG EC tablet Take 1 tablet (81 mg total) by mouth daily. Swallow whole. 30 tablet 11   atorvastatin (LIPITOR) 80 MG tablet Take 1 tablet (80 mg total) by mouth daily. 90 tablet 1   clopidogrel (PLAVIX) 75 MG tablet Take 1 tablet (75 mg total) by mouth daily. 90 tablet 3   diazepam (VALIUM) 5 MG tablet Take 2 tablets (10 mg total) by mouth at bedtime as needed for anxiety. 30 tablet 0   diphenhydrAMINE (BENADRYL) 25 MG tablet Take 25 mg by mouth as needed for allergies.     furosemide (LASIX) 40 MG tablet Take 1 tablet (40 mg total) by mouth daily. For one week then stop. 7 tablet 0   glipiZIDE (GLUCOTROL) 10 MG tablet Take 1 tablet (10 mg total) by mouth 2 (two) times daily before a meal. 60 tablet 3   metoprolol tartrate (LOPRESSOR) 50 MG tablet Take 1 tablet (50 mg  total) by mouth 2 (two) times daily. 60 tablet 1   potassium chloride SA (KLOR-CON) 20 MEQ tablet Take 1 tablet (20 mEq total) by mouth daily. For one week then stop. 7 tablet 0   No current facility-administered medications on file prior to visit.    ROS: See HPI  Observations/Objective: Awake, alert, oriented x3 Not in acute distress Normal mood  CMP Latest Ref Rng & Units 11/21/2020 11/19/2020 11/18/2020  Glucose 70 - 99 mg/dL 034(V) 425(Z) 563(O)  BUN 6 - 20 mg/dL 13 19 75(I)  Creatinine 0.61 - 1.24 mg/dL 4.33 2.95 1.88  Sodium 135 - 145 mmol/L 132(L) 134(L) 133(L)   Potassium 3.5 - 5.1 mmol/L 4.6 3.8 4.4  Chloride 98 - 111 mmol/L 101 95(L) 99  CO2 22 - 32 mmol/L 23 28 28   Calcium 8.9 - 10.3 mg/dL ) 8.1(L) 8.3(L)  Total Protein 6.5 - 8.1 g/dL - - -  Total Bilirubin 0.3 - 1.2 mg/dL - - -  Alkaline Phos 38 - 126 U/L - - -  AST 15 - 41 U/L - - -  ALT 0 - 44 U/L - - -    Lipid Panel     Component Value Date/Time   CHOL 181 11/12/2020 1501   TRIG 100 11/12/2020 1501   HDL 34 (L) 11/12/2020 1501   CHOLHDL 5.3 11/12/2020 1501   VLDL 20 11/12/2020 1501   LDLCALC 127 (H) 11/12/2020 1501    Lab Results  Component Value Date   HGBA1C 7.9 (H) 11/13/2020    Assessment and Plan: 1. S/P CABG x 4 Stable Risk factor modification Continue high intensity statin Discussed with the patient to contact his cardiologist so [facility] due to cost  2. Type 2 diabetes mellitus with other circulatory complication, without long-term current use of insulin (HCC) Last A1c was 7.9 Would love to check an A1c again and adjust regimen Consider Farxiga at next visit Continue glipizide in the meantime. When he comes in for his medications he will schedule a follow-up appointment with me  3. Hyperlipidemia, unspecified hyperlipidemia type He is taking Crestor but atorvastatin appears on his med list He should be able to obtain Crestor from the [facility]. He has also been advised to apply for the [place] financial discount   Follow Up Instructions: Advised to make an in person appointment for 1 month for follow-up labs   I discussed the assessment and treatment plan with the patient. The patient was provided an opportunity to ask questions and all were answered. The patient agreed with the plan and demonstrated an understanding of the instructions.   The patient was advised to call back or seek an in-person evaluation if the  symptoms worsen or if the condition fails to improve as anticipated.     I provided 16 minutes total of non-face-to-face time during this encounter.   01/13/2021, MD, FAAFP. [facility] [place], [place]   03/11/2021, 3:39 PM

## 2021-03-12 ENCOUNTER — Encounter: Payer: Self-pay | Admitting: Family Medicine

## 2021-03-19 ENCOUNTER — Encounter (HOSPITAL_COMMUNITY)
Admission: RE | Admit: 2021-03-19 | Discharge: 2021-03-19 | Disposition: A | Discharge status: Home / Self Care | Payer: Self-pay | Source: Ambulatory Visit | Attending: Cardiology | Admitting: Cardiology

## 2021-03-19 NOTE — Progress Notes (Signed)
Cardiac Rehab: Virtual Visit  Patient participates in the virtual cardiac rehab program. Called patient to follow-up on progress with exercise at home. Patient is walking 15-20 minutes 4 days/week, occasionally twice/day as his mode of exercise. Patient states he could walk 30 minutes but stops at about 15 minutes because of the heat and getting sweaty and needing to rest. Encouraged patient to try to accumulate 30 minutes of walking, and patient is amenable to this. Patient is also stretching in the morning. Patient hasn't been doing the resistance exercises but is amenable to adding these in 1-2 days/week. Patient doesn't have hand weights at home but can use cans or water bottles for his hand weight exercises. Other than needing to rest because of the heat, patient states he's feeling very good with his walking. He feels stronger and can walk longer before feeling tired. Patient is scheduled for an echocardiogram tomorrow and is hoping he will not continue to need to wear the Life vest.   Patient will increase duration of walking to 30 minutes, 4 days/week either 30 minutes once daily or 15 minutes twice daily taking rest breaks as needed. Patient will incorporate hand weight exercises 1-2 days/week.  Encouraged patient to read the education booklet provided at orientation on risk factor modification. Patient states he's doing well with his nutrition. Will follow-up next week.  Artist Pais, MS, ACSM [employer] 03/19/2021 [phone]

## 2021-03-21 ENCOUNTER — Other Ambulatory Visit: Payer: Self-pay | Admitting: Thoracic Surgery (Cardiothoracic Vascular Surgery)

## 2021-03-21 DIAGNOSIS — Z951 Presence of aortocoronary bypass graft: Secondary | ICD-10-CM

## 2021-03-24 ENCOUNTER — Other Ambulatory Visit: Payer: Self-pay

## 2021-03-24 ENCOUNTER — Other Ambulatory Visit: Payer: Self-pay | Admitting: Cardiology

## 2021-03-24 ENCOUNTER — Ambulatory Visit
Admission: RE | Admit: 2021-03-24 | Discharge: 2021-03-24 | Disposition: A | Discharge status: Home / Self Care | Payer: Self-pay | Source: Ambulatory Visit | Attending: Surgery | Admitting: Surgery

## 2021-03-24 ENCOUNTER — Ambulatory Visit (INDEPENDENT_AMBULATORY_CARE_PROVIDER_SITE_OTHER): Payer: Self-pay | Admitting: Physician Assistant

## 2021-03-24 VITALS — BP 110/75 | HR 84 | Resp 20 | Ht 71.25 in | Wt 205.0 lb

## 2021-03-24 DIAGNOSIS — Z951 Presence of aortocoronary bypass graft: Secondary | ICD-10-CM

## 2021-03-24 DIAGNOSIS — E7800 Pure hypercholesterolemia, unspecified: Secondary | ICD-10-CM

## 2021-03-24 NOTE — Progress Notes (Signed)
[address]       [place] [address]             [phone]        Gerald Andrade is a 50 y.o. male patient s/p CABG x 4 on 11/14/2020. He presents today for his routine follow-up appointment. He underwent a thoracentesis on 8/18 and initially he felt worse. He had some fatigue and malaise. He stated he had a low-grade fever. Since then he feels much better. He does continue have decreased stamina where he felt before he could continue for 10-12 hours he is only able to be out and about for 4 hours before he needs rest. He lost his dad recently and he feels like some depression is probably contributing to his progress.    He also asked about his Adderall. He was on this medication for ADD for years but it was stopped in the hospital and never restarted.    No diagnosis found. 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 Scheduled Meds: Current Outpatient Medications on File Prior to Visit  Medication Sig Dispense Refill   albuterol (PROVENTIL HFA;VENTOLIN HFA) 108 (90 BASE) MCG/ACT inhaler Inhale 2 puffs into the lungs every 6 (six) hours as needed for wheezing or shortness of breath.     aspirin EC 81 MG EC tablet Take 1 tablet (81 mg total) by mouth daily. Swallow whole. 30 tablet 11   atorvastatin (LIPITOR) 80 MG tablet Take 1 tablet (80 mg total) by mouth daily. 90 tablet 1   clopidogrel (PLAVIX) 75 MG tablet Take 1 tablet (75 mg total) by mouth daily. 90 tablet 3   diazepam (VALIUM) 5 MG tablet Take 2 tablets (10 mg total) by mouth at bedtime as needed for anxiety. 30 tablet 0   diphenhydrAMINE (BENADRYL) 25 MG tablet Take 25 mg by mouth as needed for allergies.     furosemide (LASIX) 40 MG tablet Take 1 tablet  (40 mg total) by mouth daily. For one week then stop. 7 tablet 0   glipiZIDE (GLUCOTROL) 10 MG tablet Take 1 tablet (10 mg total) by mouth 2 (two) times daily before a meal. 60 tablet 3   metoprolol tartrate (LOPRESSOR) 50 MG tablet Take 1 tablet (50 mg total) by mouth 2 (two) times daily. 60 tablet 1   potassium chloride SA (KLOR-CON) 20 MEQ tablet Take 1 tablet (20 mEq total) by mouth daily. For one week then stop. 7 tablet 0   No current facility-administered medications on file prior to visit.     Allergies  Allergen Reactions   Shrimp [Shellfish Allergy] Swelling    Chest tighten    Active Problems:   * No active hospital problems. *  Cor: NSR, no murmur Pulm: CTA, diminished breath sounds in the left lower lobe.  Abd: no tenderness Wound: well healed, small blister on the distal end of the incision Ext: no edema  Subjective Objective: Vital signs (most recent): Blood pressure 110/75, pulse 84, resp. rate 20, height 5' 11.25" (1.81 m), weight 205 lb (93 kg), SpO2 96 %. Assessment & Plan  He is doing well today. We reviewed his  CXR which showed a small left pleural effusion. I do not think there is enough fluid to tap. He does not have any pedal edema and his weight has been stable. His dry weight is right at 200 lbs. I asked him to weigh every morning and if he gains 3-5 lbs over night he is to call our office. Also, if he experiences any shortness of breath he is to call our office. As far as stamina, this should improve.  If it does not, I would recommend further work-up. He states his lower legs gets fatigued after lot of walking or activity. He needs to continued to increase his activity level as tolerated and rest when needed. I suggested to follow-up with his cardiologist and PCP.    Plan: Overall, he is doing better than the last time he was seen by [place]. His pleural effusion is still there but small. He does not appear fluid overloaded and he is close to his dry weight. We  discussed a diet and exercise routine. We discussed daily weights. He does not need any routine follow-up at this time. He will call if his shortness of breath worsens or if he notices any changes in his weight or pedal edema. I will defer to his PCP regarding his Adderall. He prefers not to be on this medication and it was held during surgery due to some of the side effects.   Gerald Andrade 03/24/2021

## 2021-04-01 ENCOUNTER — Encounter (HOSPITAL_COMMUNITY)
Admission: RE | Admit: 2021-04-01 | Discharge: 2021-04-01 | Disposition: A | Discharge status: Home / Self Care | Payer: Self-pay | Source: Ambulatory Visit | Attending: Cardiology | Admitting: Cardiology

## 2021-04-01 NOTE — Progress Notes (Signed)
Cardiac Rehab: Virtual Visit  Patient participates in the virtual cardiac rehab program via the [facility] app. Called patient to check progress with exercise at home. Patient states he hasn't walked for exercise the past few days because of the heat and humidity. When he does walk for exercise, patient states he's walking at least 15 minutes. He is still having issues with lack of stamina, which is a barrier to exercise for him. He states that in addition to lack of stamina, he's had headaches the past week, and he plans to contact his cardiologist in regard to this. Patient states he spoke with his surgeon's PA about a week ago regarding his fatigue/lack of stamina. Patient states that although he hasn't been walking consistently for exercise, he has been active. Patient has a smart watch that tracks his activity and steps. I discussed with patient how walking can help increase his stamina and encouraged him to walk on the days he feels better(no headaches), taking short 10-15 minute walks when he doesn't have the energy to walk longer. Patient advised that he can take short walks multiple times per day versus long continuous walks if that works better for him.  Will follow-up with patient in 2 weeks. Patient encouraged to call if he has questions about his home exercise guidelines.   Artist Pais, MS, ACSM CEP  04/01/2021 [phone]

## 2021-04-10 ENCOUNTER — Other Ambulatory Visit: Payer: Self-pay

## 2021-04-10 ENCOUNTER — Emergency Department (HOSPITAL_COMMUNITY): Payer: Self-pay

## 2021-04-10 ENCOUNTER — Encounter (HOSPITAL_COMMUNITY): Payer: Self-pay

## 2021-04-10 ENCOUNTER — Inpatient Hospital Stay (HOSPITAL_COMMUNITY)
Admission: EM | Admit: 2021-04-10 | Discharge: 2021-04-16 | DRG: 419 | Disposition: A | Discharge status: Home / Self Care | Payer: Self-pay | Attending: Internal Medicine | Admitting: Internal Medicine

## 2021-04-10 ENCOUNTER — Inpatient Hospital Stay (HOSPITAL_COMMUNITY): Payer: Self-pay

## 2021-04-10 DIAGNOSIS — R1011 Right upper quadrant pain: Secondary | ICD-10-CM

## 2021-04-10 DIAGNOSIS — I480 Paroxysmal atrial fibrillation: Secondary | ICD-10-CM | POA: Diagnosis present

## 2021-04-10 DIAGNOSIS — Z91013 Allergy to seafood: Secondary | ICD-10-CM

## 2021-04-10 DIAGNOSIS — K810 Acute cholecystitis: Principal | ICD-10-CM | POA: Diagnosis present

## 2021-04-10 DIAGNOSIS — Z7982 Long term (current) use of aspirin: Secondary | ICD-10-CM

## 2021-04-10 DIAGNOSIS — Z7984 Long term (current) use of oral hypoglycemic drugs: Secondary | ICD-10-CM

## 2021-04-10 DIAGNOSIS — I10 Essential (primary) hypertension: Secondary | ICD-10-CM | POA: Diagnosis present

## 2021-04-10 DIAGNOSIS — Z8249 Family history of ischemic heart disease and other diseases of the circulatory system: Secondary | ICD-10-CM

## 2021-04-10 DIAGNOSIS — I25110 Atherosclerotic heart disease of native coronary artery with unstable angina pectoris: Secondary | ICD-10-CM | POA: Diagnosis present

## 2021-04-10 DIAGNOSIS — Z79899 Other long term (current) drug therapy: Secondary | ICD-10-CM

## 2021-04-10 DIAGNOSIS — Z8679 Personal history of other diseases of the circulatory system: Secondary | ICD-10-CM

## 2021-04-10 DIAGNOSIS — E785 Hyperlipidemia, unspecified: Secondary | ICD-10-CM | POA: Diagnosis present

## 2021-04-10 DIAGNOSIS — Z87891 Personal history of nicotine dependence: Secondary | ICD-10-CM

## 2021-04-10 DIAGNOSIS — F419 Anxiety disorder, unspecified: Secondary | ICD-10-CM | POA: Diagnosis present

## 2021-04-10 DIAGNOSIS — E119 Type 2 diabetes mellitus without complications: Secondary | ICD-10-CM

## 2021-04-10 DIAGNOSIS — Z419 Encounter for procedure for purposes other than remedying health state, unspecified: Secondary | ICD-10-CM

## 2021-04-10 DIAGNOSIS — Z7902 Long term (current) use of antithrombotics/antiplatelets: Secondary | ICD-10-CM

## 2021-04-10 DIAGNOSIS — I2510 Atherosclerotic heart disease of native coronary artery without angina pectoris: Secondary | ICD-10-CM | POA: Diagnosis present

## 2021-04-10 DIAGNOSIS — Z20822 Contact with and (suspected) exposure to covid-19: Secondary | ICD-10-CM | POA: Diagnosis present

## 2021-04-10 DIAGNOSIS — R1013 Epigastric pain: Secondary | ICD-10-CM

## 2021-04-10 DIAGNOSIS — R109 Unspecified abdominal pain: Secondary | ICD-10-CM | POA: Diagnosis present

## 2021-04-10 DIAGNOSIS — Z951 Presence of aortocoronary bypass graft: Secondary | ICD-10-CM

## 2021-04-10 HISTORY — DX: Essential (primary) hypertension: I10

## 2021-04-10 LAB — COMPREHENSIVE METABOLIC PANEL
ALT: 15 U/L (ref 0–44)
AST: 18 U/L (ref 15–41)
Albumin: 4.2 g/dL (ref 3.5–5.0)
Alkaline Phosphatase: 90 U/L (ref 38–126)
Anion gap: 8 (ref 5–15)
BUN: 13 mg/dL (ref 6–20)
CO2: 27 mmol/L (ref 22–32)
Calcium: 9.3 mg/dL (ref 8.9–10.3)
Chloride: 105 mmol/L (ref 98–111)
Creatinine, Ser: 1.05 mg/dL (ref 0.61–1.24)
GFR, Estimated: >60 mL/min (ref >60)
Glucose, Bld: 202 mg/dL — ABNORMAL HIGH (ref 70–99)
Potassium: 4.4 mmol/L (ref 3.5–5.1)
Sodium: 140 mmol/L (ref 135–145)
Total Bilirubin: 1.3 mg/dL — ABNORMAL HIGH (ref 0.3–1.2)
Total Protein: 7.7 g/dL (ref 6.5–8.1)

## 2021-04-10 LAB — URINALYSIS, ROUTINE W REFLEX MICROSCOPIC
Bilirubin Urine: NEGATIVE
Glucose, UA: 250 mg/dL — AB
Hgb urine dipstick: NEGATIVE
Ketones, ur: NEGATIVE mg/dL
Leukocytes,Ua: NEGATIVE
Nitrite: NEGATIVE
Protein, ur: NEGATIVE mg/dL
Specific Gravity, Urine: 1.015 (ref 1.005–1.030)
pH: 6.5 (ref 5.0–8.0)

## 2021-04-10 LAB — CBG MONITORING, ED: Glucose-Capillary: 157 mg/dL — ABNORMAL HIGH (ref 70–99)

## 2021-04-10 LAB — CBC WITH DIFFERENTIAL/PLATELET
Abs Immature Granulocytes: 0.05 10*3/uL (ref 0.00–0.07)
Basophils Absolute: 0 10*3/uL (ref 0.0–0.1)
Basophils Relative: 0 %
Eosinophils Absolute: 0 10*3/uL (ref 0.0–0.5)
Eosinophils Relative: 0 %
HCT: 43.1 % (ref 39.0–52.0)
Hemoglobin: 14.1 g/dL (ref 13.0–17.0)
Immature Granulocytes: 0 %
Lymphocytes Relative: 13 %
Lymphs Abs: 1.6 10*3/uL (ref 0.7–4.0)
MCH: 27.1 pg (ref 26.0–34.0)
MCHC: 32.7 g/dL (ref 30.0–36.0)
MCV: 82.7 fL (ref 80.0–100.0)
Monocytes Absolute: 1 10*3/uL (ref 0.1–1.0)
Monocytes Relative: 9 %
Neutro Abs: 9.3 10*3/uL — ABNORMAL HIGH (ref 1.7–7.7)
Neutrophils Relative %: 78 %
Platelets: 242 10*3/uL (ref 150–400)
RBC: 5.21 MIL/uL (ref 4.22–5.81)
RDW: 14.7 % (ref 11.5–15.5)
WBC: 12.1 10*3/uL — ABNORMAL HIGH (ref 4.0–10.5)
nRBC: 0 % (ref 0.0–0.2)

## 2021-04-10 LAB — BRAIN NATRIURETIC PEPTIDE: B Natriuretic Peptide: 39 pg/mL (ref 0.0–100.0)

## 2021-04-10 LAB — TROPONIN I (HIGH SENSITIVITY)
Troponin I (High Sensitivity): 3 ng/L (ref <18)
Troponin I (High Sensitivity): 4 ng/L (ref <18)

## 2021-04-10 LAB — SARS CORONAVIRUS 2 (TAT 6-24 HRS): SARS Coronavirus 2: NEGATIVE

## 2021-04-10 LAB — LIPASE, BLOOD: Lipase: 54 U/L — ABNORMAL HIGH (ref 11–51)

## 2021-04-10 LAB — GLUCOSE, CAPILLARY
Glucose-Capillary: 88 mg/dL (ref 70–99)
Glucose-Capillary: 146 mg/dL — ABNORMAL HIGH (ref 70–99)

## 2021-04-10 MED ORDER — MORPHINE SULFATE (PF) 4 MG/ML IV SOLN
4.0000 mg | INTRAVENOUS | Status: DC | PRN
Start: 2021-04-10 — End: 2021-04-11
  Administered 2021-04-10 – 2021-04-11 (×2): 4 mg via INTRAVENOUS
  Filled 2021-04-10 (×2): qty 1

## 2021-04-10 MED ORDER — SODIUM CHLORIDE 0.9 % IV SOLN: Freq: Once | INTRAVENOUS | Status: AC

## 2021-04-10 MED ORDER — INSULIN ASPART 100 UNIT/ML IJ SOLN
0.0000 [IU] | Freq: Three times a day (TID) | INTRAMUSCULAR | Status: DC
  Administered 2021-04-11: 2 [IU] via SUBCUTANEOUS
  Administered 2021-04-13: 1 [IU] via SUBCUTANEOUS
  Administered 2021-04-16: 2 [IU] via SUBCUTANEOUS

## 2021-04-10 MED ORDER — PIPERACILLIN-TAZOBACTAM 3.375 G IVPB
3.3750 g | Freq: Three times a day (TID) | INTRAVENOUS | Status: DC
  Administered 2021-04-10 – 2021-04-16 (×18): 3.375 g via INTRAVENOUS
  Filled 2021-04-10 (×20): qty 50

## 2021-04-10 MED ORDER — PROCHLORPERAZINE EDISYLATE 10 MG/2ML IJ SOLN
10.0000 mg | Freq: Four times a day (QID) | INTRAMUSCULAR | Status: DC | PRN
  Administered 2021-04-11 – 2021-04-16 (×8): 10 mg via INTRAVENOUS
  Filled 2021-04-10 (×10): qty 2

## 2021-04-10 MED ORDER — MORPHINE SULFATE (PF) 4 MG/ML IV SOLN
4.0000 mg | Freq: Once | INTRAVENOUS | Status: AC
  Administered 2021-04-10: 4 mg via INTRAVENOUS
  Filled 2021-04-10: qty 1

## 2021-04-10 MED ORDER — OXYCODONE HCL 5 MG PO TABS
5.0000 mg | ORAL_TABLET | ORAL | Status: DC | PRN
Start: 2021-04-10 — End: 2021-04-13
  Administered 2021-04-10 – 2021-04-13 (×5): 5 mg via ORAL
  Filled 2021-04-10 (×5): qty 1

## 2021-04-10 MED ORDER — SODIUM CHLORIDE (PF) 0.9 % IJ SOLN
INTRAMUSCULAR | Status: AC
  Filled 2021-04-10: qty 50

## 2021-04-10 MED ORDER — HEPARIN SODIUM (PORCINE) 5000 UNIT/ML IJ SOLN
5000.0000 [IU] | Freq: Three times a day (TID) | INTRAMUSCULAR | Status: DC
  Filled 2021-04-10 (×6): qty 1

## 2021-04-10 MED ORDER — MORPHINE SULFATE (PF) 2 MG/ML IV SOLN
2.0000 mg | Freq: Once | INTRAVENOUS | Status: AC
Start: 2021-04-10 — End: 2021-04-10
  Administered 2021-04-10: 2 mg via INTRAVENOUS
  Filled 2021-04-10: qty 1

## 2021-04-10 MED ORDER — PIPERACILLIN-TAZOBACTAM 3.375 G IVPB 30 MIN
3.3750 g | Freq: Once | INTRAVENOUS | Status: AC
Start: 2021-04-10 — End: 2021-04-10
  Administered 2021-04-10: 3.375 g via INTRAVENOUS
  Filled 2021-04-10: qty 50

## 2021-04-10 MED ORDER — TECHNETIUM TC 99M MEBROFENIN IV KIT
4.8000 | PACK | Freq: Once | INTRAVENOUS | Status: AC
  Administered 2021-04-10: 4.8 via INTRAVENOUS

## 2021-04-10 MED ORDER — METOPROLOL TARTRATE 50 MG PO TABS
50.0000 mg | ORAL_TABLET | Freq: Two times a day (BID) | ORAL | Status: DC
  Administered 2021-04-10 – 2021-04-16 (×12): 50 mg via ORAL
  Filled 2021-04-10 (×12): qty 1

## 2021-04-10 MED ORDER — DOCUSATE SODIUM 100 MG PO CAPS
100.0000 mg | ORAL_CAPSULE | Freq: Two times a day (BID) | ORAL | Status: DC
  Administered 2021-04-10 – 2021-04-16 (×10): 100 mg via ORAL
  Filled 2021-04-10 (×10): qty 1

## 2021-04-10 MED ORDER — HYDROMORPHONE HCL 1 MG/ML IJ SOLN
1.0000 mg | Freq: Once | INTRAMUSCULAR | Status: AC
Start: 2021-04-10 — End: 2021-04-10
  Administered 2021-04-10: 1 mg via INTRAVENOUS
  Filled 2021-04-10: qty 1

## 2021-04-10 MED ORDER — METOCLOPRAMIDE HCL 5 MG/ML IJ SOLN
10.0000 mg | Freq: Once | INTRAMUSCULAR | Status: AC
  Administered 2021-04-10: 10 mg via INTRAVENOUS
  Filled 2021-04-10: qty 2

## 2021-04-10 MED ORDER — TECHNETIUM TC 99M MEBROFENIN IV KIT
1.1000 | PACK | Freq: Once | INTRAVENOUS | Status: AC
  Administered 2021-04-10: 1.1 via INTRAVENOUS

## 2021-04-10 MED ORDER — DIAZEPAM 5 MG PO TABS
5.0000 mg | ORAL_TABLET | Freq: Every evening | ORAL | Status: DC | PRN
  Administered 2021-04-12 – 2021-04-14 (×3): 5 mg via ORAL
  Filled 2021-04-10 (×3): qty 1

## 2021-04-10 MED ORDER — ACETAMINOPHEN 500 MG PO TABS
1000.0000 mg | ORAL_TABLET | Freq: Four times a day (QID) | ORAL | Status: DC
  Administered 2021-04-10 – 2021-04-16 (×17): 1000 mg via ORAL
  Filled 2021-04-10 (×22): qty 2

## 2021-04-10 MED ORDER — ATORVASTATIN CALCIUM 40 MG PO TABS
80.0000 mg | ORAL_TABLET | Freq: Every day | ORAL | Status: DC
  Administered 2021-04-10 – 2021-04-15 (×6): 80 mg via ORAL
  Filled 2021-04-10 (×6): qty 2

## 2021-04-10 MED ORDER — IOHEXOL 350 MG/ML SOLN
80.0000 mL | Freq: Once | INTRAVENOUS | Status: AC | PRN
  Administered 2021-04-10: 80 mL via INTRAVENOUS

## 2021-04-10 MED ORDER — MORPHINE SULFATE (PF) 4 MG/ML IV SOLN
3.0000 mg | Freq: Once | INTRAVENOUS | Status: AC
  Administered 2021-04-10: 3 mg via INTRAVENOUS
  Filled 2021-04-10: qty 1

## 2021-04-10 MED ORDER — SODIUM CHLORIDE 0.9 % IV BOLUS
250.0000 mL | Freq: Once | INTRAVENOUS | Status: AC
Start: 2021-04-10 — End: 2021-04-10
  Administered 2021-04-10: 250 mL via INTRAVENOUS

## 2021-04-10 NOTE — ED Triage Notes (Signed)
Pt complains of epigastric pain for one week States before he took his pain meds and it went away but this time it didn't, he also took phenergan, pepto-bismol, and a laxative with no relief

## 2021-04-10 NOTE — ED Provider Notes (Signed)
[facility]-EMERGENCY DEPT Provider Note   CSN: [id number] Arrival date & time: 04/10/21  0335     History No chief complaint on file.   Gerald Andrade is a 50 y.o. male with a history of CAD s/p CABG x4, diabetes mellitus type 2, pleural effusion s/p thoracentesis on 8/18 who presents the emergency department by EMS with a chief complaint of epigastric pain.  The patient reports that he awoke from sleep around 00:30 with sudden onset, constant, worsening, nonradiating epigastric pain.  He characterizes the pain as dull and "relentless".  He reports associated nausea and feeling flushed and clammy.  He attempted to take Pepto-Bismol, Tums, a suppository, Percocet, and Phenergan for his symptoms without any improvement.  He reports that he has had 2 episodes of similar pain, both at night, earlier in the week that resolved after taking pain medication and Phenergan.  However, tonight after his symptoms continue to worsen despite treatment, he called EMS for further evaluation.  In the ED, he continues to state that his mouth feels dry and he is parched.  Reports that he has had a poor appetite for most of the day, but otherwise was feeling at his baseline prior to onset of pain.  Although he denies chest pain, he points to his lower chest when referencing his pain.  He denies shortness of breath, diaphoresis, dizziness, lightheadedness, leg swelling, cough, fever, chills, diarrhea, constipation, vomiting, visual changes, palpitations, dysuria, hematuria, flank pain, back pain.  He reports compliance with his home medications.  The history is provided by the patient and medical records. No language interpreter was used.      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   Type II diabetes mellitus (HCC) 11/13/2020   ACS (acute coronary syndrome) (HCC) 11/12/2020   Unstable angina (HCC)    Coronary artery disease      Past Surgical History:  Procedure Laterality Date   CARDIAC CATHETERIZATION     CORONARY ARTERY BYPASS GRAFT N/A 11/14/2020   Procedure: CORONARY ARTERY BYPASS GRAFTING (CABG), ON PUMP, TIMES FOUR, USING BILATERAL INTERNAL MAMMARY ARTERIES, LEFT RADIAL ARTERY HARVESTED OPEN, AND RIGHT ENDOSCOPICALLY HARVESTED GREATER SAPHENOUS VEIN;  Surgeon: Purcell Nails, MD;  Location: [facility] OR;  Service: Open Heart Surgery;  Laterality: N/A;   ENDOVEIN HARVEST OF GREATER SAPHENOUS VEIN Right 11/14/2020   Procedure: ENDOVEIN HARVEST OF GREATER SAPHENOUS VEIN;  Surgeon: Purcell Nails, MD;  Location: [facility] OR;  Service: Open Heart Surgery;  Laterality: Right;   IR THORACENTESIS ASP PLEURAL SPACE W/IMG GUIDE  02/27/2021   LEFT HEART CATH AND CORONARY ANGIOGRAPHY N/A 11/12/2020   Procedure: LEFT HEART CATH AND CORONARY ANGIOGRAPHY;  Surgeon: Yates Decamp, MD;  Location: [facility] INVASIVE CV LAB;  Service: Cardiovascular;  Laterality: N/A;   RADIAL ARTERY HARVEST Left 11/14/2020   Procedure: RADIAL ARTERY HARVEST;  Surgeon: Purcell Nails, MD;  Location: [facility] OR;  Service: Open Heart Surgery;  Laterality: Left;   TEE WITHOUT CARDIOVERSION N/A 11/14/2020   Procedure: TRANSESOPHAGEAL ECHOCARDIOGRAM (TEE);  Surgeon: Purcell Nails, MD;  Location: [facility]  OR;  Service: Open Heart Surgery;  Laterality: N/A;       Family History  Problem Relation Age of Onset   CAD Mother    CAD Father     Social History   Tobacco Use   Smoking status: Former    Packs/day: 2.00    Years: 10.00    Pack years: 20.00    Types: Cigarettes    Quit date: 12/11/2000    Years since quitting: 20.3   Smokeless tobacco: Never  Vaping Use   Vaping Use: Never  used  Substance Use Topics   Alcohol use: No   Drug use: No    Home Medications Prior to Admission medications   Medication Sig Start Date End Date Taking? Authorizing Provider  acetaminophen (TYLENOL) 500 MG tablet Take 1,000 mg by mouth 2 (two) times daily as needed for mild pain or headache.   Yes [provider]  albuterol (PROVENTIL HFA;VENTOLIN HFA) 108 (90 BASE) MCG/ACT inhaler Inhale 2 puffs into the lungs every 6 (six) hours as needed for wheezing or shortness of breath.   Yes [provider]  aspirin EC 81 MG EC tablet Take 1 tablet (81 mg total) by mouth daily. Swallow whole. 11/22/20  Yes Barrett, Erin R, PA-C  atorvastatin (LIPITOR) 80 MG tablet Take 1 tablet (80 mg total) by mouth daily. 03/10/21  Yes Cantwell, Celeste C, PA-C  clopidogrel (PLAVIX) 75 MG tablet Take 1 tablet (75 mg total) by mouth daily. 02/13/21  Yes Cantwell, Celeste C, PA-C  diazepam (VALIUM) 5 MG tablet Take 2 tablets (10 mg total) by mouth at bedtime as needed for anxiety. Patient taking differently: Take 5 mg by mouth at bedtime as needed for anxiety. 12/25/20  Yes Yates Decamp, MD  diphenhydrAMINE (BENADRYL) 25 MG tablet Take 25 mg by mouth as needed for allergies.   Yes [provider]  glipiZIDE (GLUCOTROL) 10 MG tablet Take 1 tablet (10 mg total) by mouth 2 (two) times daily before a meal. 12/25/20  Yes Yates Decamp, MD  metoprolol tartrate (LOPRESSOR) 50 MG tablet Take 1 tablet (50 mg total) by mouth 2 (two) times daily. 12/10/20  Yes Conte, Tessa N, PA-C  furosemide (LASIX) 40 MG tablet Take 1 tablet (40 mg total) by mouth daily. For one week then stop. Patient not taking: No sig reported 02/17/21   Ardelle Balls, PA-C  potassium chloride SA (KLOR-CON) 20 MEQ tablet Take 1 tablet (20 mEq total) by mouth daily. For one week then stop. Patient not taking: No sig reported 02/17/21   Ardelle Balls, PA-C    Allergies    Shrimp [shellfish allergy]  Review of Systems    Review of Systems  Constitutional:  Negative for appetite change, chills, diaphoresis, fatigue and fever.       Clammy  HENT:  Negative for congestion, hearing loss, mouth sores, postnasal drip, sinus pain, sneezing and sore throat.   Eyes:  Negative for visual disturbance.  Respiratory:  Negative for cough, choking, chest tightness, shortness of breath and wheezing.   Cardiovascular:  Positive for chest pain. Negative for palpitations and leg swelling.  Gastrointestinal:  Positive for abdominal pain and nausea. Negative for blood in stool, constipation, diarrhea and vomiting.  Endocrine: Negative for polyuria.  Genitourinary:  Negative for dysuria, flank pain, frequency and hematuria.  Musculoskeletal:  Negative for back pain, myalgias, neck pain and neck stiffness.  Skin:  Negative for color change, rash and wound.  Allergic/Immunologic: Negative for immunocompromised state.  Neurological:  Negative for dizziness, seizures, syncope, weakness, numbness and headaches.  Psychiatric/Behavioral:  Negative for confusion.    Physical Exam Updated Vital Signs BP (!) 174/102   Pulse 73   Temp 97.6 F (36.4 C) (Oral)   Resp 16   Ht 6' (1.829 m)   Wt 93 kg   SpO2 97%   BMI 27.80 kg/m   Physical Exam Vitals and nursing note reviewed.  Constitutional:      General: He is in acute distress.     Appearance: He is well-developed. He is obese. He is ill-appearing. He is not diaphoretic.  HENT:     Head: Normocephalic.  Eyes:     Conjunctiva/sclera: Conjunctivae normal.  Cardiovascular:     Rate and Rhythm: Normal rate and regular rhythm.     Heart sounds: No murmur heard. Pulmonary:     Effort: Pulmonary effort is normal.     Comments: Midline sternotomy scar with no dehiscence noted.  No focal tenderness palpation over sternotomy scar.  Lung sounds are diminished throughout.  Faint, intermittent wheeze is noted in the right lung base. Abdominal:     General: There is no distension.      Palpations: Abdomen is soft.     Comments: Abdomen is distended, but not firm.  Minimal tenderness to palpation noted in the epigastric region and right upper quadrant.  No rebound or guarding.  No CVA tenderness.  Negative Murphy sign.  Musculoskeletal:        General: No tenderness.     Cervical back: Neck supple.     Right lower leg: No edema.     Left lower leg: No edema.  Skin:    General: Skin is warm and dry.     Capillary Refill: Capillary refill takes less than 2 seconds.     Coloration: Skin is pale. Skin is not jaundiced.     Findings: No rash.  Neurological:     General: No focal deficit present.     Mental Status: He is alert.  Psychiatric:        Mood and Affect: Mood is anxious.        Behavior: Behavior normal.    ED Results / Procedures / Treatments   Labs (all labs ordered are listed, but only abnormal results are displayed) Labs Reviewed  CBC WITH DIFFERENTIAL/PLATELET - Abnormal; Notable for the following components:      Result Value   WBC 12.1 (*)    Neutro Abs 9.3 (*)    All other components within normal limits  COMPREHENSIVE METABOLIC PANEL - Abnormal; Notable for the following components:   Glucose, Bld 202 (*)    Total Bilirubin 1.3 (*)    All other components within normal limits  LIPASE, BLOOD - Abnormal; Notable for the following components:   Lipase 54 (*)    All other components within normal limits  URINALYSIS, ROUTINE W REFLEX MICROSCOPIC - Abnormal; Notable for the following components:   Glucose, UA 250 (*)    All other components within normal limits  CBG MONITORING, ED - Abnormal; Notable for the following components:   Glucose-Capillary 157 (*)    All other components within normal limits  BRAIN NATRIURETIC PEPTIDE  TROPONIN I (HIGH SENSITIVITY)  TROPONIN I (HIGH SENSITIVITY)    EKG None  Radiology CT CHEST ABDOMEN PELVIS W CONTRAST  Result Date: 04/10/2021 CLINICAL DATA:  Epigastric pain for 1 week.  Elevated lipase  EXAM: CT CHEST, ABDOMEN, AND PELVIS WITH CONTRAST TECHNIQUE:  Multidetector CT imaging of the chest, abdomen and pelvis was performed following the standard protocol during bolus administration of intravenous contrast. CONTRAST:  56mL OMNIPAQUE IOHEXOL 350 MG/ML SOLN COMPARISON:  Chest CT 11/12/2020 FINDINGS: CT CHEST FINDINGS Cardiovascular: Borderline heart size. Anterior mediastinal fat stranding related to CABG. Scattered atheromatous calcifications. Mediastinum/Nodes: No hematoma or pneumomediastinum. Lungs/Pleura: Small, thick walled pleural effusion on the left. Thoracentesis was performed last month. Adjacent subpleural pulmonary opacity with swirling attributed to developing round atelectasis. Musculoskeletal: Unremarkable sternotomy incision. No acute or aggressive finding. CT ABDOMEN PELVIS FINDINGS Hepatobiliary: No focal liver abnormality.Thick walled gallbladder without over distension or clear stone. Pancreas: Unremarkable. Spleen: Unremarkable. Adrenals/Urinary Tract: Negative adrenals. No hydronephrosis or stone. Unremarkable bladder. Stomach/Bowel: No obstruction. No appendicitis. Scattered colonic diverticula distally. Vascular/Lymphatic: No acute vascular abnormality. No mass or adenopathy. Reproductive:No pathologic findings. Other: No ascites or pneumoperitoneum. Small periumbilical hernia which is fatty. Musculoskeletal: No acute abnormalities. IMPRESSION: 1. Gallbladder wall thickening/pericholecystic edema, please correlate for cholecystitis symptoms. 2. Chronic, small left pleural effusion with adjacent round atelectasis. 3. Additional incidental findings noted above. Electronically Signed   By: Tiburcio Pea M.D.   On: 04/10/2021 06:09   DG Chest Portable 1 View  Result Date: 04/10/2021 CLINICAL DATA:  Chest pain with nausea EXAM: PORTABLE CHEST 1 VIEW COMPARISON:  03/24/2021 FINDINGS: Chronic cardiomegaly. Prior CABG. Improved aeration at the left base where there is likely decreased  blunting/obscuration of the lateral left costophrenic sulcus. No edema or pneumothorax. IMPRESSION: Improved aeration at the left base.  No new abnormality. Electronically Signed   By: Tiburcio Pea M.D.   On: 04/10/2021 04:53    Procedures Procedures   Medications Ordered in ED Medications  piperacillin-tazobactam (ZOSYN) IVPB 3.375 g (has no administration in time range)  sodium chloride 0.9 % bolus 250 mL (0 mLs Intravenous Stopped 04/10/21 0438)  metoCLOPramide (REGLAN) injection 10 mg (10 mg Intravenous Given 04/10/21 0414)  morphine 4 MG/ML injection 4 mg (4 mg Intravenous Given 04/10/21 0412)  morphine 2 MG/ML injection 2 mg (2 mg Intravenous Given 04/10/21 0528)  sodium chloride (PF) 0.9 % injection (  Given 04/10/21 0626)  iohexol (OMNIPAQUE) 350 MG/ML injection 80 mL (80 mLs Intravenous Contrast Given 04/10/21 0541)  HYDROmorphone (DILAUDID) injection 1 mg (1 mg Intravenous Given 04/10/21 6384)    ED Course  I have reviewed the triage vital signs and the nursing notes.  Pertinent labs & imaging results that were available during my care of the patient were reviewed by me and considered in my medical decision making (see chart for details).  Clinical Course as of 04/10/21 0720  Thu Apr 10, 2021  5364 Updated patient on CT results.  Upon further review, patient states that he was unable to eat dinner last night.  He had taken a few bites of ribs on [place] fries, and his pain immediately increased so he did not continue to eat.  Given the fact that he has had 3 episodes of pain at night over the last week, I symptoms are suspicious for biliary colic.  Right upper quadrant ultrasound has been ordered.  On reevaluation, clinically the patient looks markedly improved from initial presentation.  Pallor has improved.  He continues to have pain, but it was more controlled until a couple of minutes prior to my reevaluation.  Pain medication has been ordered.  The patient and his wife are in  agreement with this plan. [MM]    Clinical Course User Index [MM] Manning Luna A, PA-C  MDM Rules/Calculators/A&P                           50 year old male with a history of CAD s/p CABG x4, diabetes mellitus type 2, pleural effusion s/p thoracentesis on 8/18. Patient evaluated by me immediately after being roomed at approximately 3:55 AM.  On evaluation, patient is ill-appearing, pale, clammy, and in distress.  Markedly hypertensive on arrival.  The patient was seen and independently evaluated by Dr. Clayborne Dana, attending physician.  Initial differential diagnosis is extensive, includes intrathoracic and intra-abdominal pathology.  Labs and imaging of been ordered and independently reviewed by me.  EKG with S1, Q3, T3.  Otherwise, unchanged from previous.  Delta troponin is not elevated.  BNP is normal.  Chest x-ray on my evaluation appears to have slightly worsening pleural effusion in the left lower lung base, but radiology feels that this is improved from previous.  Total bilirubin is minimally elevated at 1.3.  No metabolic derangements.  Normal transaminases.  Lipase is minimally elevated at 51.  He also has a mild leukocytosis of 12.1.  Given unclear etiology of symptoms after initial work-up, CT C/A/P was ordered, which was concerning for cholecystitis with gallbladder wall thickening and pericholecystic edema.  Upon further evaluation, patient's presentation is more consistent with very early colic.  He now adds that he did not eat dinner last night because after he took several bites of [place] fries and ribs, his pain came on and worsened, which he previously did not disclose on arrival.  On reevaluation, clinically the patient appears markedly improved from arrival.  He is no longer in distress, pallor and clamminess has improved.  He does report that pain has been improving, until over the last few minutes.  We will reduce pain medication order right upper quadrant ultrasound.  Zosyn has been  ordered.  Patient care transferred to PA [facility] at the end of my shift. Patient presentation, ED course, and plan of care discussed with review of all pertinent labs and imaging. Please see his/her note for further details regarding further ED course and disposition.   Final Clinical Impression(s) / ED Diagnoses Final diagnoses:  Epigastric pain  RUQ pain    Rx / DC Orders ED Discharge Orders     None        Barkley Boards, PA-C 04/10/21 0720    Mesner, Barbara Cower, MD 04/10/21 2356

## 2021-04-10 NOTE — Consult Note (Signed)
CARDIOLOGY CONSULT NOTE  Patient ID: Gerald Andrade MRN: [id number] DOB/AGE: [birth year] 50 y.o.  Admit date: 04/10/2021 Referring Physician: [facility] hospitalist Reason for Consultation:  Pre-op risk stratification  HPI:   50 y.o. Caucasian male with hypertension, hyperlipidemia, type 2 DM, CAD s/p CABGX4 11/2020 (LIMA-LAD, free RIMA-RPDA, left radial artery-OM2, SVG-Diag), presented with epigastric pain.  Is suspected to have cholecystitis, currently undergoing HIDA scan.  He is elected to undergo cholecystectomy.  Cardiology consult sought for preoperative stratification.  Patient had been doing well from cardiac standpoint prior to this admission.  Denies any chest pain, shortness of breath.  He has been having abdominal pain and nausea.    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   LIMA to LAD, Free RIMA to PDA, LRA to OM2, SVG to Diag   Type II diabetes mellitus (HCC) 11/13/2020     Past Surgical History:  Procedure Laterality Date   CARDIAC CATHETERIZATION     CORONARY ARTERY BYPASS GRAFT N/A 11/14/2020   Procedure: CORONARY ARTERY BYPASS GRAFTING (CABG), ON PUMP, TIMES FOUR, USING BILATERAL INTERNAL MAMMARY ARTERIES, LEFT RADIAL ARTERY HARVESTED OPEN, AND RIGHT ENDOSCOPICALLY HARVESTED GREATER SAPHENOUS VEIN;  Surgeon: Purcell Nails, MD;  Location: [facility] OR;  Service: Open Heart Surgery;  Laterality: N/A;   ENDOVEIN HARVEST OF GREATER SAPHENOUS VEIN Right 11/14/2020   Procedure: ENDOVEIN HARVEST OF GREATER SAPHENOUS VEIN;  Surgeon: Purcell Nails, MD;  Location: [facility] OR;  Service: Open Heart Surgery;  Laterality: Right;   IR THORACENTESIS ASP PLEURAL SPACE W/IMG GUIDE  02/27/2021   LEFT HEART CATH AND CORONARY ANGIOGRAPHY N/A 11/12/2020   Procedure: LEFT HEART CATH AND CORONARY ANGIOGRAPHY;  Surgeon: Yates Decamp, MD;  Location: [facility] INVASIVE CV LAB;  Service:  Cardiovascular;  Laterality: N/A;   RADIAL ARTERY HARVEST Left 11/14/2020   Procedure: RADIAL ARTERY HARVEST;  Surgeon: Purcell Nails, MD;  Location[facility];  Service: Open Heart Surgery;  Laterality: Left;   TEE WITHOUT CARDIOVERSION N/A 11/14/2020   Procedure: TRANSESOPHAGEAL ECHOCARDIOGRAM (TEE);  Surgeon: Purcell Nails, MD;  Location: [facility] OR;  Service: Open Heart Surgery;  Laterality: N/A;      Family History  Problem Relation Age of Onset   CAD Mother    CAD Father      Social History: Social History   Socioeconomic History   Marital status: Married    Spouse name: Not on file   Number of children: Not on file   Years of education: 12   Highest education level: Not on file  Occupational History   Not on file  Tobacco Use   Smoking status: Former    Packs/day: 2.00    Years: 10.00    Pack years: 20.00    Types: Cigarettes    Quit date: 12/11/2000    Years since quitting: 20.3   Smokeless tobacco: Never  Vaping Use   Vaping Use: Never used  Substance and Sexual Activity   Alcohol use: No   Drug use: No   Sexual activity: Not on file  Other Topics Concern   Not on file  Social History Narrative   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  HENT:  Negative for congestion.   Eyes:  Negative for visual disturbance.  Cardiovascular:  Negative for chest pain, dyspnea on exertion, leg swelling, palpitations and syncope.  Respiratory:  Negative for cough.   Endocrine: Negative for cold intolerance.  Hematologic/Lymphatic: Does not bruise/bleed easily.  Skin:   Negative for itching and rash.  Musculoskeletal:  Negative for myalgias.  Gastrointestinal:  Positive for abdominal pain. Negative for nausea and vomiting.  Genitourinary:  Negative for dysuria.  Neurological:  Negative for dizziness and weakness.  Psychiatric/Behavioral:  The patient is not nervous/anxious.   All other systems reviewed and are negative.    Physical Exam: Physical Exam Vitals and nursing note reviewed.  Constitutional:      General: He is not in acute distress.    Appearance: He is well-developed.  HENT:     Head: Normocephalic and atraumatic.  Eyes:     Conjunctiva/sclera: Conjunctivae normal.     Pupils: Pupils are equal, round, and reactive to light.  Neck:     Vascular: No JVD.  Cardiovascular:     Rate and Rhythm: Normal rate and regular rhythm.     Pulses: Normal pulses and intact distal pulses.     Heart sounds: No murmur heard. Pulmonary:     Effort: Pulmonary effort is normal.     Breath sounds: Normal breath sounds. No wheezing or rales.  Abdominal:     General: Bowel sounds are normal.     Palpations: Abdomen is soft.     Tenderness: There is no rebound.  Musculoskeletal:        General: No tenderness. Normal range of motion.     Right lower leg: No edema.     Left lower leg: No edema.  Lymphadenopathy:     Cervical: No cervical adenopathy.  Skin:    General: Skin is warm and dry.  Neurological:     Mental Status: He is alert and oriented to person, place, and time.     Cranial Nerves: No cranial nerve deficit.     Labs:   Lab Results  Component Value Date   WBC 12.1 (H) 04/10/2021   HGB 14.1 04/10/2021   HCT 43.1 04/10/2021   MCV 82.7 04/10/2021   PLT 242 04/10/2021    Recent Labs  Lab 04/10/21 0405  NA 140  K 4.4  CL 105  CO2 27  BUN 13  CREATININE 1.05  CALCIUM 9.3  PROT 7.7  BILITOT 1.3*  ALKPHOS 90  ALT 15  AST 18  GLUCOSE 202*    Lipid Panel     Component Value Date/Time   CHOL 181 11/12/2020 1501   TRIG  100 11/12/2020 1501   HDL 34 (L) 11/12/2020 1501   CHOLHDL 5.3 11/12/2020 1501   VLDL 20 11/12/2020 1501   LDLCALC 127 (H) 11/12/2020 1501    BNP (last 3 results) Recent Labs    04/10/21 0405  BNP 39.0    HEMOGLOBIN A1C Lab Results  Component Value Date   HGBA1C 7.9 (H) 11/13/2020   MPG 180.03 11/13/2020    Cardiac Panel (last 3 results) No results for input(s): CKTOTAL, CKMB, RELINDX in the last 8760 hours.  Invalid input(s): TROPONINHS  No results found for: CKTOTAL, CKMB, CKMBINDEX  TSH Recent Labs    11/13/20 0611 11/19/20 1812  TSH 0.651 1.479      Radiology: CT CHEST ABDOMEN PELVIS W CONTRAST  Result Date: 04/10/2021 CLINICAL DATA:  Epigastric pain for 1 week.  Elevated lipase EXAM: CT CHEST, ABDOMEN, AND PELVIS WITH CONTRAST TECHNIQUE: Multidetector CT imaging of the chest, abdomen and pelvis was performed following the standard protocol during bolus administration of intravenous contrast. CONTRAST:  28mL OMNIPAQUE IOHEXOL 350 MG/ML SOLN COMPARISON:  Chest CT 11/12/2020 FINDINGS: CT CHEST FINDINGS Cardiovascular: Borderline heart size. Anterior mediastinal fat stranding related to CABG. Scattered atheromatous calcifications. Mediastinum/Nodes: No hematoma or pneumomediastinum. Lungs/Pleura: Small, thick walled pleural effusion on the left. Thoracentesis was performed last month. Adjacent subpleural pulmonary opacity with swirling attributed to developing round atelectasis. Musculoskeletal: Unremarkable sternotomy incision. No acute or aggressive finding. CT ABDOMEN PELVIS FINDINGS Hepatobiliary: No focal liver abnormality.Thick walled gallbladder without over distension or clear stone. Pancreas: Unremarkable. Spleen: Unremarkable. Adrenals/Urinary Tract: Negative adrenals. No hydronephrosis or stone. Unremarkable bladder. Stomach/Bowel: No obstruction. No appendicitis. Scattered colonic diverticula distally. Vascular/Lymphatic: No acute vascular abnormality. No mass or  adenopathy. Reproductive:No pathologic findings. Other: No ascites or pneumoperitoneum. Small periumbilical hernia which is fatty. Musculoskeletal: No acute abnormalities. IMPRESSION: 1. Gallbladder wall thickening/pericholecystic edema, please correlate for cholecystitis symptoms. 2. Chronic, small left pleural effusion with adjacent round atelectasis. 3. Additional incidental findings noted above. Electronically Signed   By: Tiburcio Pea M.D.   On: 04/10/2021 06:09   DG Chest Portable 1 View  Result Date: 04/10/2021 CLINICAL DATA:  Chest pain with nausea EXAM: PORTABLE CHEST 1 VIEW COMPARISON:  03/24/2021 FINDINGS: Chronic cardiomegaly. Prior CABG. Improved aeration at the left base where there is likely decreased blunting/obscuration of the lateral left costophrenic sulcus. No edema or pneumothorax. IMPRESSION: Improved aeration at the left base.  No new abnormality. Electronically Signed   By: Tiburcio Pea M.D.   On: 04/10/2021 04:53   US Abdomen Limited RUQ (LIVER/GB)  Result Date: 04/10/2021 CLINICAL DATA:  Right upper quadrant pain EXAM: ULTRASOUND ABDOMEN LIMITED RIGHT UPPER QUADRANT COMPARISON:  Preceding abdominal CT FINDINGS: Gallbladder: Wall thickening and heterogeneity with wall thickness measured up to 8 mm. No focal tenderness. Common bile duct: Diameter: 5 mm Liver: No focal lesion identified. Within normal limits in parenchymal echogenicity. Portal vein is patent on color Doppler imaging with normal direction of blood flow towards the liver. IMPRESSION: Thickened and edematous gallbladder wall but no focal tenderness or visible stone - pattern suggestive of reactive thickening. Electronically Signed   By: Tiburcio Pea M.D.   On: 04/10/2021 07:27    Scheduled Meds:  acetaminophen  1,000 mg Oral Q6H   Continuous Infusions: PRN Meds:.  CARDIAC STUDIES:  EKG 04/10/2021: Sinus rhythm 58 bpm Old inferior infarct   CABGX4 11/14/2020: LIMA-LAD, free RIMA-RPDA, left radial  artery-OM2, SVG-Diag  Coronary angiogram 11/12/2020: Left Heart Catheterization 11/12/20:  LV: Mild LV systolic dysfunction, EF 45% with inferior akinesis.  No significant mitral regurgitation.  Normal EDP.  No pressure gradient across the aortic valve. LM: Large-caliber vessel, normal.  Short. LAD: Large vessel in the proximal segment.  Proximal LAD has an ulcerated lesion.  There is origin of a very large D1 at the proximal segment and a large septal perforator at which point the LAD is occluded and has faint ipsilateral collaterals from the circumflex, diagonal and the SP1, and after reconstitution of the LAD, there is a focal 80% stenosis.  The diagonal 1 is very large and has proximal high-grade  80 to 90% stenosis. CX: Large vessel, giving origin to small OM1 after which it is a CTO with bridging collaterals.  Distal circumflex also receives collaterals from the LAD.  Circumflex also gives collaterals to occluded RCA. RCA: It is occluded in the midsegment after the origin of RV branch.  Distal RCA including PL and PDA branches are large and are collateralized from circumflex and LAD.   Recommendation: Patient has complex multivessel disease and would benefit from CABG with arterial conduits if possible including LIMA to LAD and diagonal and RIMA to RCA and probably a free radial graft to the circumflex.  Surgical consultation has been requested.  Patient needs to be admitted inpatient in view of complex coronary anatomy and high risk for cardiac events.  Echocardiogram 11/12/2020:  1. Left ventricular ejection fraction, by estimation, is 55 to 60%. The  left ventricle has normal function. The left ventricle has no regional  wall motion abnormalities. Left ventricular diastolic parameters were  normal.   2. Right ventricular systolic function is normal. The right ventricular  size is normal.   3. The mitral valve is grossly normal. No evidence of mitral valve  regurgitation.   4. The aortic  valve is tricuspid. Aortic valve regurgitation is not  visualized.   Assessment & Recommendations:  50 y.o. Caucasian male with hypertension, hyperlipidemia, type 2 DM, CAD s/p CABGX4 11/2020 (LIMA-LAD, free RIMA-RPDA, left radial artery-OM2, SVG-Diag), lipid with probable cholecystitis.  Cardiology consultation sought for perioperative cardiac risk stratification for cholecystectomy  Cardiac risk stratification: Recent CABG with no current anginal symptoms. Low perioperative cardiac risk for cholecystectomy. Agree with holding Plavix.  If possible, continue aspirin. Plavix can be resumed on discharge, when deemed safe by surgical team. Continue Lipitor 80 mg, metoprolol tartrate 50 mg twice daily. If he must remain n.p.o or unable to take p.o. meds,., can hold above medications and use IV Lopressor 5 mg twice daily prn.  Cardiology will sign off.  Please call us back, in case of any questions.     Elder Negus, MD Pager: [phone] Office: [phone]

## 2021-04-10 NOTE — ED Provider Notes (Signed)
Received signout from previous provider, please see her note for complete H&P.  This is a 50 year old male significant history of cardiac disease currently on Plavix, cared for by Dr. Jacinto Halim who presents with epigastric abdominal pain.  Pain started last night, decreased appetite and having eating throughout yesterday.  Initial abdominal pelvis CT scan shows gallbladder wall thickening concerning for acute cholecystitis, limited abdominal ultrasound obtained shows thickening and edematous gallbladder wall but no focal tenderness or visible stone, pattern suggestive of reactive thickening.  General surgery was consulted and recommend HIDA scan as well as hospital admission.  Since patient is on Plavix, he will need to be off Plavix and preop cardiology consultation.  If HIDA scan negative, GI would likely need to be involved.  Appreciate consultation from [facility] hospitalist, Dr. Ronaldo Miyamoto who agrees to see and will admit patient for further care  BP 125/82   Pulse 86   Temp 97.6 F (36.4 C) (Oral)   Resp 13   Ht 6' (1.829 m)   Wt 93 kg   SpO2 95%   BMI 27.80 kg/m   Results for orders placed or performed during the hospital encounter of 04/10/21  CBC with Differential  Result Value Ref Range   WBC 12.1 (H) 4.0 - 10.5 K/uL   RBC 5.21 4.22 - 5.81 MIL/uL   Hemoglobin 14.1 13.0 - 17.0 g/dL   HCT 91.4 78.2 - 95.6 %   MCV 82.7 80.0 - 100.0 fL   MCH 27.1 26.0 - 34.0 pg   MCHC 32.7 30.0 - 36.0 g/dL   RDW 21.3 08.6 - 57.8 %   Platelets 242 150 - 400 K/uL   nRBC 0.0 0.0 - 0.2 %   Neutrophils Relative % 78 %   Neutro Abs 9.3 (H) 1.7 - 7.7 K/uL   Lymphocytes Relative 13 %   Lymphs Abs 1.6 0.7 - 4.0 K/uL   Monocytes Relative 9 %   Monocytes Absolute 1.0 0.1 - 1.0 K/uL   Eosinophils Relative 0 %   Eosinophils Absolute 0.0 0.0 - 0.5 K/uL   Basophils Relative 0 %   Basophils Absolute 0.0 0.0 - 0.1 K/uL   Immature Granulocytes 0 %   Abs Immature Granulocytes 0.05 0.00 - 0.07 K/uL  Comprehensive  metabolic panel  Result Value Ref Range   Sodium 140 135 - 145 mmol/L   Potassium 4.4 3.5 - 5.1 mmol/L   Chloride 105 98 - 111 mmol/L   CO2 27 22 - 32 mmol/L   Glucose, Bld 202 (H) 70 - 99 mg/dL   BUN 13 6 - 20 mg/dL   Creatinine, Ser 4.69 0.61 - 1.24 mg/dL   Calcium 9.3 8.9 - 62.9 mg/dL   Total Protein 7.7 6.5 - 8.1 g/dL   Albumin 4.2 3.5 - 5.0 g/dL   AST 18 15 - 41 U/L   ALT 15 0 - 44 U/L   Alkaline Phosphatase 90 38 - 126 U/L   Total Bilirubin 1.3 (H) 0.3 - 1.2 mg/dL   GFR, Estimated >52 >84 mL/min   Anion gap 8 5 - 15  Lipase, blood  Result Value Ref Range   Lipase 54 (H) 11 - 51 U/L  Urinalysis, Routine w reflex microscopic Urine, Clean Catch  Result Value Ref Range   Color, Urine YELLOW YELLOW   APPearance CLEAR CLEAR   Specific Gravity, Urine 1.015 1.005 - 1.030   pH 6.5 5.0 - 8.0   Glucose, UA 250 (A) NEGATIVE mg/dL   Hgb urine dipstick NEGATIVE  NEGATIVE   Bilirubin Urine NEGATIVE NEGATIVE   Ketones, ur NEGATIVE NEGATIVE mg/dL   Protein, ur NEGATIVE NEGATIVE mg/dL   Nitrite NEGATIVE NEGATIVE   Leukocytes,Ua NEGATIVE NEGATIVE  Brain natriuretic peptide  Result Value Ref Range   B Natriuretic Peptide 39.0 0.0 - 100.0 pg/mL  POC CBG, ED  Result Value Ref Range   Glucose-Capillary 157 (H) 70 - 99 mg/dL   Comment 1 Notify RN    Comment 2 Document in Chart   Troponin I (High Sensitivity)  Result Value Ref Range   Troponin I (High Sensitivity) 3 <18 ng/L  Troponin I (High Sensitivity)  Result Value Ref Range   Troponin I (High Sensitivity) 4 <18 ng/L   DG Chest 2 View  Result Date: 03/24/2021 CLINICAL DATA:  Coronary artery bypass grafting EXAM: CHEST - 2 VIEW COMPARISON:  02/27/2021 FINDINGS: Small to moderate left pleural effusion which appears at least partially laterally loculated has increased in size slightly since prior examination. There is focal airspace opacity within the left lower lobe which may reflect a superimposed infectious or inflammatory  infiltrate right lung is clear. No pneumothorax. No pleural effusion on the right. Coronary artery bypass grafting has been performed. Cardiac size within normal limits. Pulmonary vascularity is normal. IMPRESSION: Interval development of a focal infiltrate within the left lower lobe, possibly infectious in the appropriate clinical setting. Follow-up chest radiograph in 4-6 weeks would be helpful in documenting resolution. Slight interval increase in small to moderate left pleural effusion. Electronically Signed   By: Helyn Numbers M.D.   On: 03/24/2021 12:36   CT CHEST ABDOMEN PELVIS W CONTRAST  Result Date: 04/10/2021 CLINICAL DATA:  Epigastric pain for 1 week.  Elevated lipase EXAM: CT CHEST, ABDOMEN, AND PELVIS WITH CONTRAST TECHNIQUE: Multidetector CT imaging of the chest, abdomen and pelvis was performed following the standard protocol during bolus administration of intravenous contrast. CONTRAST:  63mL OMNIPAQUE IOHEXOL 350 MG/ML SOLN COMPARISON:  Chest CT 11/12/2020 FINDINGS: CT CHEST FINDINGS Cardiovascular: Borderline heart size. Anterior mediastinal fat stranding related to CABG. Scattered atheromatous calcifications. Mediastinum/Nodes: No hematoma or pneumomediastinum. Lungs/Pleura: Small, thick walled pleural effusion on the left. Thoracentesis was performed last month. Adjacent subpleural pulmonary opacity with swirling attributed to developing round atelectasis. Musculoskeletal: Unremarkable sternotomy incision. No acute or aggressive finding. CT ABDOMEN PELVIS FINDINGS Hepatobiliary: No focal liver abnormality.Thick walled gallbladder without over distension or clear stone. Pancreas: Unremarkable. Spleen: Unremarkable. Adrenals/Urinary Tract: Negative adrenals. No hydronephrosis or stone. Unremarkable bladder. Stomach/Bowel: No obstruction. No appendicitis. Scattered colonic diverticula distally. Vascular/Lymphatic: No acute vascular abnormality. No mass or adenopathy. Reproductive:No pathologic  findings. Other: No ascites or pneumoperitoneum. Small periumbilical hernia which is fatty. Musculoskeletal: No acute abnormalities. IMPRESSION: 1. Gallbladder wall thickening/pericholecystic edema, please correlate for cholecystitis symptoms. 2. Chronic, small left pleural effusion with adjacent round atelectasis. 3. Additional incidental findings noted above. Electronically Signed   By: Tiburcio Pea M.D.   On: 04/10/2021 06:09   DG Chest Portable 1 View  Result Date: 04/10/2021 CLINICAL DATA:  Chest pain with nausea EXAM: PORTABLE CHEST 1 VIEW COMPARISON:  03/24/2021 FINDINGS: Chronic cardiomegaly. Prior CABG. Improved aeration at the left base where there is likely decreased blunting/obscuration of the lateral left costophrenic sulcus. No edema or pneumothorax. IMPRESSION: Improved aeration at the left base.  No new abnormality. Electronically Signed   By: Tiburcio Pea M.D.   On: 04/10/2021 04:53   US Abdomen Limited RUQ (LIVER/GB)  Result Date: 04/10/2021 CLINICAL DATA:  Right upper quadrant pain EXAM:  ULTRASOUND ABDOMEN LIMITED RIGHT UPPER QUADRANT COMPARISON:  Preceding abdominal CT FINDINGS: Gallbladder: Wall thickening and heterogeneity with wall thickness measured up to 8 mm. No focal tenderness. Common bile duct: Diameter: 5 mm Liver: No focal lesion identified. Within normal limits in parenchymal echogenicity. Portal vein is patent on color Doppler imaging with normal direction of blood flow towards the liver. IMPRESSION: Thickened and edematous gallbladder wall but no focal tenderness or visible stone - pattern suggestive of reactive thickening. Electronically Signed   By: Tiburcio Pea M.D.   On: 04/10/2021 07:27      Fayrene Helper, PA-C 04/10/21 1217    Mancel Bale, MD 04/10/21 [phone]

## 2021-04-10 NOTE — Plan of Care (Signed)
  Problem: Education: Goal: Knowledge of General Education information will improve Description: Including pain rating scale, medication(s)/side effects and non-pharmacologic comfort measures Outcome: Progressing   Problem: Clinical Measurements: Goal: Will remain free from infection Outcome: Progressing   Problem: Nutrition: Goal: Adequate nutrition will be maintained Outcome: Progressing   Problem: Elimination: Goal: Will not experience complications related to bowel motility Outcome: Progressing   Problem: Pain Managment: Goal: General experience of comfort will improve Outcome: Progressing   Problem: Safety: Goal: Ability to remain free from injury will improve Outcome: Progressing   

## 2021-04-10 NOTE — ED Notes (Signed)
Pt has urinal at bedside and made aware of need for urine specimen. Pt provided ice chips per request and was OK'd by PA

## 2021-04-10 NOTE — Consult Note (Signed)
Rhonda Ledsome [facility] [birth year]  [id number].    Requesting MD: Dr. Gray Bernhardt Chief Complaint/Reason for Consult: epigastric abdominal pain  HPI:  This is a 50 yo white male who has a history of CAD s/p CABG x4 in May of this year by Dr. Cornelius Moras with what appears to be post op A fib, fatty liver disease, HTN, HLD, Type 2 DM (last hgb A1C 8 in May) who states for the last week or so he has been having some intermittent epigastric abdominal pain.  He denies nausea or vomiting during these episodes except last night only secondary to pain and not a symptom.  It is not associated with food or anything that he can think of.  He is somewhat vague in his time frame of when all this began.  He denies a history of reflux or NSAID use.  He does take a baby ASA and plavix, which he took yesterday morning.  He had some vague pain last night around 8.  When he got home he ate 1 bite of ribs and just didn't feel well.  He went to bed and awoke at 12:15am with severe epigastric abdominal pain.  This did not radiate anywhere.  He took percocet, Pepto-bismol, and phenergan with no relief of his pain.  He denies pain in his RUQ or his LUQ.  He did use a glycerin suppository to make sure this wasn't secondary to constipation with a small BM but no change in his symptoms  He presented to the [facility] where he was noted to have a WBC of 12 and a TB of 1.3 and normal troponins.  He underwent a CT scan which revealed gallbladder wall thickening.  He then underwent an [place] that also revealed some wall thickening but no gallstones and no tenderness over the gallbladder.  We have been asked to see him for further evaluation and recommendations.  ROS: ROS: Please see HPI, otherwise all other systems have been reviewed and are negative.  Family History  Problem Relation Age of Onset   CAD Mother    CAD Father     Past Medical History:  Diagnosis Date   Coronary artery disease     Fatty liver    HTN (hypertension)     Hyperlipidemia    S/P CABG x 4 11/14/2020   LIMA to LAD, Free RIMA to PDA, LRA to OM2, SVG to Diag   Type II diabetes mellitus (HCC) 11/13/2020    Past Surgical History:  Procedure Laterality Date   CARDIAC CATHETERIZATION     CORONARY ARTERY BYPASS GRAFT N/A 11/14/2020   Procedure: CORONARY ARTERY BYPASS GRAFTING (CABG), ON PUMP, TIMES FOUR, USING BILATERAL INTERNAL MAMMARY ARTERIES, LEFT RADIAL ARTERY HARVESTED OPEN, AND RIGHT ENDOSCOPICALLY HARVESTED GREATER SAPHENOUS VEIN;  Surgeon: Purcell Nails, MD;  Location: [facility] OR;  Service: Open Heart Surgery;  Laterality: N/A;   ENDOVEIN HARVEST OF GREATER SAPHENOUS VEIN Right 11/14/2020   Procedure: ENDOVEIN HARVEST OF GREATER SAPHENOUS VEIN;  Surgeon: Purcell Nails, MD;  Location: [facility] OR;  Service: Open Heart Surgery;  Laterality: Right;   IR THORACENTESIS ASP PLEURAL SPACE W/IMG GUIDE  02/27/2021   LEFT HEART CATH AND CORONARY ANGIOGRAPHY N/A 11/12/2020   Procedure: LEFT HEART CATH AND CORONARY ANGIOGRAPHY;  Surgeon: Yates Decamp, MD;  Location: [facility] INVASIVE CV LAB;  Service: Cardiovascular;  Laterality: N/A;   RADIAL ARTERY HARVEST Left 11/14/2020   Procedure: RADIAL ARTERY HARVEST;  Surgeon: Purcell Nails, MD;  Location: [facility] OR;  Service: Open Heart Surgery;  Laterality: Left;   TEE WITHOUT CARDIOVERSION N/A 11/14/2020   Procedure: TRANSESOPHAGEAL ECHOCARDIOGRAM (TEE);  Surgeon: Purcell Nails, MD;  Location: [facility] OR;  Service: Open Heart Surgery;  Laterality: N/A;    Social History:  reports that he quit smoking about 20 years ago. His smoking use included cigarettes. He has a 20.00 pack-year smoking history. He has never used smokeless tobacco. He reports that he does not drink alcohol and does not use drugs.  Allergies:  Allergies  Allergen Reactions   Shrimp [Shellfish Allergy] Swelling    Chest tighten     (Not in a hospital admission)    Physical Exam: Blood pressure (!) 147/95, pulse 80, temperature 97.6 F (36.4 C),  temperature source Oral, resp. rate 13, height 6' (1.829 m), weight 93 kg, SpO2 94 %. General: pleasant, WD, WN white male who is laying in bed in NAD HEENT: head is normocephalic, atraumatic.  Sclera are noninjected.  PERRL.  Ears and nose without any masses or lesions.  Mouth is pink and moist Heart: regular, rate, and rhythm.  Normal s1,s2. No obvious murmurs, gallops, or rubs noted.  Palpable radial and pedal pulses bilaterally Lungs: CTAB, no wheezes, rhonchi, or rales noted.  Respiratory effort nonlabored Abd: soft, tender in epigastrium but no tenderness at all in RUQ, even with deep palpation, ND, +BS, no masses or organomegaly.  Very small reducible umbilical hernia MS: all 4 extremities are symmetrical with no cyanosis, clubbing, or edema. Skin: warm and dry with no masses, lesions, or rashes Neuro: Cranial nerves 2-12 grossly intact, sensation is normal throughout Psych: A&Ox3 with an appropriate affect.   Results for orders placed or performed during the hospital encounter of 04/10/21 (from the past 48 hour(s))  CBC with Differential     Status: Abnormal   Collection Time: 04/10/21  4:05 AM  Result Value Ref Range   WBC 12.1 (H) 4.0 - 10.5 K/uL   RBC 5.21 4.22 - 5.81 MIL/uL   Hemoglobin 14.1 13.0 - 17.0 g/dL   HCT 35.3 29.9 - 24.2 %   MCV 82.7 80.0 - 100.0 fL   MCH 27.1 26.0 - 34.0 pg   MCHC 32.7 30.0 - 36.0 g/dL   RDW 68.3 41.9 - 62.2 %   Platelets 242 150 - 400 K/uL   nRBC 0.0 0.0 - 0.2 %   Neutrophils Relative % 78 %   Neutro Abs 9.3 (H) 1.7 - 7.7 K/uL   Lymphocytes Relative 13 %   Lymphs Abs 1.6 0.7 - 4.0 K/uL   Monocytes Relative 9 %   Monocytes Absolute 1.0 0.1 - 1.0 K/uL   Eosinophils Relative 0 %   Eosinophils Absolute 0.0 0.0 - 0.5 K/uL   Basophils Relative 0 %   Basophils Absolute 0.0 0.0 - 0.1 K/uL   Immature Granulocytes 0 %   Abs Immature Granulocytes 0.05 0.00 - 0.07 K/uL    Comment: Performed at [facility], [address],  [place], [place] [address]  Troponin I (High Sensitivity)     Status: None   Collection Time: 04/10/21  4:05 AM  Result Value Ref Range   Troponin I (High Sensitivity) 3 <18 ng/L    Comment: (NOTE) Elevated high sensitivity troponin I (hsTnI) values and significant  changes across serial measurements may suggest ACS but many other  chronic and acute conditions are known to elevate hsTnI results.  Refer to the "Links" section for chest pain algorithms and additional  guidance.  Performed at [facility], [address], [place], [place] [address]   Comprehensive metabolic panel     Status: Abnormal   Collection Time: 04/10/21  4:05 AM  Result Value Ref Range   Sodium 140 135 - 145 mmol/L   Potassium 4.4 3.5 - 5.1 mmol/L   Chloride 105 98 - 111 mmol/L   CO2 27 22 - 32 mmol/L   Glucose, Bld 202 (H) 70 - 99 mg/dL    Comment: Glucose reference range applies only to samples taken after fasting for at least 8 hours.   BUN 13 6 - 20 mg/dL   Creatinine, Ser 1.74 0.61 - 1.24 mg/dL   Calcium 9.3 8.9 - 94.4 mg/dL   Total Protein 7.7 6.5 - 8.1 g/dL   Albumin 4.2 3.5 - 5.0 g/dL   AST 18 15 - 41 U/L   ALT 15 0 - 44 U/L   Alkaline Phosphatase 90 38 - 126 U/L   Total Bilirubin 1.3 (H) 0.3 - 1.2 mg/dL   GFR, Estimated >96 >75 mL/min    Comment: (NOTE) Calculated using the CKD-EPI Creatinine Equation (2021)    Anion gap 8 5 - 15    Comment: Performed at [facility], [address], [place], [place] [address]  Lipase, blood     Status: Abnormal   Collection Time: 04/10/21  4:05 AM  Result Value Ref Range   Lipase 54 (H) 11 - 51 U/L    Comment: Performed at [facility], [address] [place], [place] [address]  Brain natriuretic peptide     Status: None   Collection Time: 04/10/21  4:05 AM  Result Value Ref Range   B Natriuretic Peptide 39.0 0.0 - 100.0 pg/mL    Comment: Performed at [facility], [address]  [place], [place] [address]  POC CBG, ED     Status: Abnormal   Collection Time: 04/10/21  4:24 AM  Result Value Ref Range   Glucose-Capillary 157 (H) 70 - 99 mg/dL    Comment: Glucose reference range applies only to samples taken after fasting for at least 8 hours.   Comment 1 Notify RN    Comment 2 Document in Chart   Troponin I (High Sensitivity)     Status: None   Collection Time: 04/10/21  5:35 AM  Result Value Ref Range   Troponin I (High Sensitivity) 4 <18 ng/L    Comment: (NOTE) Elevated high sensitivity troponin I (hsTnI) values and significant  changes across serial measurements may suggest ACS but many other  chronic and acute conditions are known to elevate hsTnI results.  Refer to the "Links" section for chest pain algorithms and additional  guidance. Performed at [facility], [address] [place], [place] [address]   Urinalysis, Routine w reflex microscopic Urine, Clean Catch     Status: Abnormal   Collection Time: 04/10/21  6:37 AM  Result Value Ref Range   Color, Urine YELLOW YELLOW   APPearance CLEAR CLEAR   Specific Gravity, Urine 1.015 1.005 - 1.030   pH 6.5 5.0 - 8.0   Glucose, UA 250 (A) NEGATIVE mg/dL   Hgb urine dipstick NEGATIVE NEGATIVE   Bilirubin Urine NEGATIVE NEGATIVE   Ketones, ur NEGATIVE NEGATIVE mg/dL   Protein, ur NEGATIVE NEGATIVE mg/dL   Nitrite NEGATIVE NEGATIVE   Leukocytes,Ua NEGATIVE NEGATIVE    Comment: Microscopic not done on urines with negative protein, blood, leukocytes, nitrite, or glucose <  500 mg/dL. Performed at [facility], [address] [place], [place] [address]    CT CHEST ABDOMEN PELVIS W CONTRAST  Result Date: 04/10/2021 CLINICAL DATA:  Epigastric pain for 1 week.  Elevated lipase EXAM: CT CHEST, ABDOMEN, AND PELVIS WITH CONTRAST TECHNIQUE: Multidetector CT imaging of the chest, abdomen and pelvis was performed following the standard protocol during bolus administration of intravenous  contrast. CONTRAST:  56mL OMNIPAQUE IOHEXOL 350 MG/ML SOLN COMPARISON:  Chest CT 11/12/2020 FINDINGS: CT CHEST FINDINGS Cardiovascular: Borderline heart size. Anterior mediastinal fat stranding related to CABG. Scattered atheromatous calcifications. Mediastinum/Nodes: No hematoma or pneumomediastinum. Lungs/Pleura: Small, thick walled pleural effusion on the left. Thoracentesis was performed last month. Adjacent subpleural pulmonary opacity with swirling attributed to developing round atelectasis. Musculoskeletal: Unremarkable sternotomy incision. No acute or aggressive finding. CT ABDOMEN PELVIS FINDINGS Hepatobiliary: No focal liver abnormality.Thick walled gallbladder without over distension or clear stone. Pancreas: Unremarkable. Spleen: Unremarkable. Adrenals/Urinary Tract: Negative adrenals. No hydronephrosis or stone. Unremarkable bladder. Stomach/Bowel: No obstruction. No appendicitis. Scattered colonic diverticula distally. Vascular/Lymphatic: No acute vascular abnormality. No mass or adenopathy. Reproductive:No pathologic findings. Other: No ascites or pneumoperitoneum. Small periumbilical hernia which is fatty. Musculoskeletal: No acute abnormalities. IMPRESSION: 1. Gallbladder wall thickening/pericholecystic edema, please correlate for cholecystitis symptoms. 2. Chronic, small left pleural effusion with adjacent round atelectasis. 3. Additional incidental findings noted above. Electronically Signed   By: Tiburcio Pea M.D.   On: 04/10/2021 06:09   DG Chest Portable 1 View  Result Date: 04/10/2021 CLINICAL DATA:  Chest pain with nausea EXAM: PORTABLE CHEST 1 VIEW COMPARISON:  03/24/2021 FINDINGS: Chronic cardiomegaly. Prior CABG. Improved aeration at the left base where there is likely decreased blunting/obscuration of the lateral left costophrenic sulcus. No edema or pneumothorax. IMPRESSION: Improved aeration at the left base.  No new abnormality. Electronically Signed   By: Tiburcio Pea M.D.    On: 04/10/2021 04:53   US Abdomen Limited RUQ (LIVER/GB)  Result Date: 04/10/2021 CLINICAL DATA:  Right upper quadrant pain EXAM: ULTRASOUND ABDOMEN LIMITED RIGHT UPPER QUADRANT COMPARISON:  Preceding abdominal CT FINDINGS: Gallbladder: Wall thickening and heterogeneity with wall thickness measured up to 8 mm. No focal tenderness. Common bile duct: Diameter: 5 mm Liver: No focal lesion identified. Within normal limits in parenchymal echogenicity. Portal vein is patent on color Doppler imaging with normal direction of blood flow towards the liver. IMPRESSION: Thickened and edematous gallbladder wall but no focal tenderness or visible stone - pattern suggestive of reactive thickening. Electronically Signed   By: Tiburcio Pea M.D.   On: 04/10/2021 07:27      Assessment/Plan Epigastric abdominal pain The patient does have wall thickening noted on his CT and [place] around his gallbladder but he is completely nontender over his gallbladder.  He is tender in his epigastrium, which still may be related to his gallbladder, but he also has no visible stones and acalculous cholecystitis is less common.  I think it is reasonable to obtain a HIDA scan to definitely rule in or out his gallbladder.  If this is negative, then he may require a GI evaluation for consideration of other etiologies.  Recommend medical admission and hold plavix and obtain cardiology consult for clearance given recent CABG.  His cardiologist is Dr. Nadara Eaton.  He should remain NPO and no narcotics until HIDA complete.  If this is positive then we can plan on lap chole once plavix washes out, which could be up to 5 days.  We will continue to monitor and  follow   FEN - NPO VTE - hold plavix ID - zosyn orderedby EDP  CAD, s/p recent CABG on plavix Post op h/o A fib, no evidence currently HTN HLD DM  Letha Cape, [facility] 04/10/2021, 9:06 AM Please see Amion for pager number during day hours 7:00am-4:30pm or 7:00am  -11:30am on weekends

## 2021-04-10 NOTE — H&P (Signed)
History and Physical    Gerald Andrade OZD:[id number] DOB: 1970/09/14 DOA: 04/10/2021  PCP: Pcp, No  Patient coming from: Home  Chief Complaint: abdominal pain  HPI: Gerald Andrade is a 50 y.o. male with medical history significant of HTN, HLD, CAD s/p CABG, DM2. Presenting with abdominal pain. His pain was epigastric and peri-umbilical. It started yesterday. It was initially a dull ache, but changed to spasms. He had nausea but no vomiting. He tried hydrocodone, pepto bismol, ginger ale, glycerine suppositories to help; however, he had no relief. The pain intensified overnight, so he came to the ED for help. He denies any other aggravating or alleviating factors.    ED Course: He was started on zosyn. CT was concerning for cholecystitis. CCS was consulted. TRH was called for admission.   Review of Systems:  Denies CP, dyspnea, palpitations, fevers, diarrhea, vomiting. Reports nausea, abdominal pain, constipation. Review of systems is otherwise negative for all not mentioned in HPI.   PMHx 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   LIMA to LAD, Free RIMA to PDA, LRA to OM2, SVG to Diag   Type II diabetes mellitus (HCC) 11/13/2020    PSHx Past Surgical History:  Procedure Laterality Date   CARDIAC CATHETERIZATION     CORONARY ARTERY BYPASS GRAFT N/A 11/14/2020   Procedure: CORONARY ARTERY BYPASS GRAFTING (CABG), ON PUMP, TIMES FOUR, USING BILATERAL INTERNAL MAMMARY ARTERIES, LEFT RADIAL ARTERY HARVESTED OPEN, AND RIGHT ENDOSCOPICALLY HARVESTED GREATER SAPHENOUS VEIN;  Surgeon: Purcell Nails, MD;  Location: [facility] OR;  Service: Open Heart Surgery;  Laterality: N/A;   ENDOVEIN HARVEST OF GREATER SAPHENOUS VEIN Right 11/14/2020   Procedure: ENDOVEIN HARVEST OF GREATER SAPHENOUS VEIN;  Surgeon: Purcell Nails, MD;  Location: [facility] OR;  Service: Open Heart Surgery;   Laterality: Right;   IR THORACENTESIS ASP PLEURAL SPACE W/IMG GUIDE  02/27/2021   LEFT HEART CATH AND CORONARY ANGIOGRAPHY N/A 11/12/2020   Procedure: LEFT HEART CATH AND CORONARY ANGIOGRAPHY;  Surgeon: Yates Decamp, MD;  Location: [facility] INVASIVE CV LAB;  Service: Cardiovascular;  Laterality: N/A;   RADIAL ARTERY HARVEST Left 11/14/2020   Procedure: RADIAL ARTERY HARVEST;  Surgeon: Purcell Nails, MD;  Location: [facility] OR;  Service: Open Heart Surgery;  Laterality: Left;   TEE WITHOUT CARDIOVERSION N/A 11/14/2020   Procedure: TRANSESOPHAGEAL ECHOCARDIOGRAM (TEE);  Surgeon: Purcell Nails, MD;  Location: [facility] OR;  Service: Open Heart Surgery;  Laterality: N/A;    SocHx  reports that he quit smoking about 20 years ago. His smoking use included cigarettes. He has a 20.00 pack-year smoking history. He has never used smokeless tobacco. He reports that he does not drink alcohol and does not use drugs.  Allergies  Allergen Reactions   Shrimp [Shellfish Allergy] Swelling    Chest tighten     FamHx Family History  Problem Relation Age of Onset   CAD Mother    CAD Father     Prior to Admission medications   Medication Sig Start Date End Date Taking? Authorizing Provider  acetaminophen (TYLENOL) 500 MG tablet Take 1,000 mg by mouth 2 (two) times daily as needed for mild pain or headache.   Yes [provider]  albuterol (PROVENTIL HFA;VENTOLIN HFA) 108 (90 BASE) MCG/ACT inhaler Inhale 2 puffs into the lungs every 6 (six) hours as needed for wheezing  or shortness of breath.   Yes [provider]  aspirin EC 81 MG EC tablet Take 1 tablet (81 mg total) by mouth daily. Swallow whole. 11/22/20  Yes Barrett, Erin R, PA-C  atorvastatin (LIPITOR) 80 MG tablet Take 1 tablet (80 mg total) by mouth daily. 03/10/21  Yes Cantwell, Celeste C, PA-C  clopidogrel (PLAVIX) 75 MG tablet Take 1 tablet (75 mg total) by mouth daily. 02/13/21  Yes Cantwell, Celeste C, PA-C  diazepam (VALIUM) 5 MG tablet Take 2  tablets (10 mg total) by mouth at bedtime as needed for anxiety. Patient taking differently: Take 5 mg by mouth at bedtime as needed for anxiety. 12/25/20  Yes Yates Decamp, MD  diphenhydrAMINE (BENADRYL) 25 MG tablet Take 25 mg by mouth as needed for allergies.   Yes [provider]  glipiZIDE (GLUCOTROL) 10 MG tablet Take 1 tablet (10 mg total) by mouth 2 (two) times daily before a meal. 12/25/20  Yes Yates Decamp, MD  metoprolol tartrate (LOPRESSOR) 50 MG tablet Take 1 tablet (50 mg total) by mouth 2 (two) times daily. 12/10/20  Yes Conte, Tessa N, PA-C  furosemide (LASIX) 40 MG tablet Take 1 tablet (40 mg total) by mouth daily. For one week then stop. Patient not taking: No sig reported 02/17/21   Ardelle Balls, PA-C  potassium chloride SA (KLOR-CON) 20 MEQ tablet Take 1 tablet (20 mEq total) by mouth daily. For one week then stop. Patient not taking: No sig reported 02/17/21   Ardelle Balls, PA-C    Physical Exam: Vitals:   04/10/21 1030 04/10/21 1100 04/10/21 1200 04/10/21 1230  BP: (!) 154/89 (!) 143/90 125/82 122/80  Pulse: 82 85 86 90  Resp: 16 14 13 14   Temp:      TempSrc:      SpO2: 93% 92% 95% 95%  Weight:      Height:        General: 50 y.o. male resting in bed in NAD Eyes: PERRL, normal sclera ENMT: Nares patent w/o discharge, orophaynx clear, dentition normal, ears w/o discharge/lesions/ulcers Neck: Supple, trachea midline Cardiovascular: RRR, +S1, S2, no m/g/r, equal pulses throughout Respiratory: CTABL, no w/r/r, normal WOB GI: BS+, NDNT, no masses noted, no organomegaly noted MSK: No e/c/c Skin: No rashes, bruises, ulcerations noted Neuro: A&O x 3, no focal deficits Psyc: Appropriate interaction and affect, calm/cooperative  Labs on Admission: I have personally reviewed following labs and imaging studies  CBC: Recent Labs  Lab 04/10/21 0405  WBC 12.1*  NEUTROABS 9.3*  HGB 14.1  HCT 43.1  MCV 82.7  PLT 242   Basic Metabolic  Panel: Recent Labs  Lab 04/10/21 0405  NA 140  K 4.4  CL 105  CO2 27  GLUCOSE 202*  BUN 13  CREATININE 1.05  CALCIUM 9.3   GFR: Estimated Creatinine Clearance: 92.4 mL/min (by C-G formula based on SCr of 1.05 mg/dL). Liver Function Tests: Recent Labs  Lab 04/10/21 0405  AST 18  ALT 15  ALKPHOS 90  BILITOT 1.3*  PROT 7.7  ALBUMIN 4.2   Recent Labs  Lab 04/10/21 0405  LIPASE 54*   No results for input(s): AMMONIA in the last 168 hours. Coagulation Profile: No results for input(s): INR, PROTIME in the last 168 hours. Cardiac Enzymes: No results for input(s): CKTOTAL, CKMB, CKMBINDEX, TROPONINI in the last 168 hours. BNP (last 3 results) No results for input(s): PROBNP in the last 8760 hours. HbA1C: No results for input(s): HGBA1C in the last 72  hours. CBG: Recent Labs  Lab 04/10/21 0424  GLUCAP 157*   Lipid Profile: No results for input(s): CHOL, HDL, LDLCALC, TRIG, CHOLHDL, LDLDIRECT in the last 72 hours. Thyroid Function Tests: No results for input(s): TSH, T4TOTAL, FREET4, T3FREE, THYROIDAB in the last 72 hours. Anemia Panel: No results for input(s): VITAMINB12, FOLATE, FERRITIN, TIBC, IRON, RETICCTPCT in the last 72 hours. Urine analysis:    Component Value Date/Time   COLORURINE YELLOW 04/10/2021 0637   APPEARANCEUR CLEAR 04/10/2021 0637   LABSPEC 1.015 04/10/2021 0637   PHURINE 6.5 04/10/2021 0637   GLUCOSEU 250 (A) 04/10/2021 0637   HGBUR NEGATIVE 04/10/2021 0637   BILIRUBINUR NEGATIVE 04/10/2021 0637   KETONESUR NEGATIVE 04/10/2021 0637   PROTEINUR NEGATIVE 04/10/2021 0637   NITRITE NEGATIVE 04/10/2021 0637   LEUKOCYTESUR NEGATIVE 04/10/2021 7322    Radiological Exams on Admission: CT CHEST ABDOMEN PELVIS W CONTRAST  Result Date: 04/10/2021 CLINICAL DATA:  Epigastric pain for 1 week.  Elevated lipase EXAM: CT CHEST, ABDOMEN, AND PELVIS WITH CONTRAST TECHNIQUE: Multidetector CT imaging of the chest, abdomen and pelvis was performed following  the standard protocol during bolus administration of intravenous contrast. CONTRAST:  51mL OMNIPAQUE IOHEXOL 350 MG/ML SOLN COMPARISON:  Chest CT 11/12/2020 FINDINGS: CT CHEST FINDINGS Cardiovascular: Borderline heart size. Anterior mediastinal fat stranding related to CABG. Scattered atheromatous calcifications. Mediastinum/Nodes: No hematoma or pneumomediastinum. Lungs/Pleura: Small, thick walled pleural effusion on the left. Thoracentesis was performed last month. Adjacent subpleural pulmonary opacity with swirling attributed to developing round atelectasis. Musculoskeletal: Unremarkable sternotomy incision. No acute or aggressive finding. CT ABDOMEN PELVIS FINDINGS Hepatobiliary: No focal liver abnormality.Thick walled gallbladder without over distension or clear stone. Pancreas: Unremarkable. Spleen: Unremarkable. Adrenals/Urinary Tract: Negative adrenals. No hydronephrosis or stone. Unremarkable bladder. Stomach/Bowel: No obstruction. No appendicitis. Scattered colonic diverticula distally. Vascular/Lymphatic: No acute vascular abnormality. No mass or adenopathy. Reproductive:No pathologic findings. Other: No ascites or pneumoperitoneum. Small periumbilical hernia which is fatty. Musculoskeletal: No acute abnormalities. IMPRESSION: 1. Gallbladder wall thickening/pericholecystic edema, please correlate for cholecystitis symptoms. 2. Chronic, small left pleural effusion with adjacent round atelectasis. 3. Additional incidental findings noted above. Electronically Signed   By: Tiburcio Pea M.D.   On: 04/10/2021 06:09   DG Chest Portable 1 View  Result Date: 04/10/2021 CLINICAL DATA:  Chest pain with nausea EXAM: PORTABLE CHEST 1 VIEW COMPARISON:  03/24/2021 FINDINGS: Chronic cardiomegaly. Prior CABG. Improved aeration at the left base where there is likely decreased blunting/obscuration of the lateral left costophrenic sulcus. No edema or pneumothorax. IMPRESSION: Improved aeration at the left base.  No  new abnormality. Electronically Signed   By: Tiburcio Pea M.D.   On: 04/10/2021 04:53   US Abdomen Limited RUQ (LIVER/GB)  Result Date: 04/10/2021 CLINICAL DATA:  Right upper quadrant pain EXAM: ULTRASOUND ABDOMEN LIMITED RIGHT UPPER QUADRANT COMPARISON:  Preceding abdominal CT FINDINGS: Gallbladder: Wall thickening and heterogeneity with wall thickness measured up to 8 mm. No focal tenderness. Common bile duct: Diameter: 5 mm Liver: No focal lesion identified. Within normal limits in parenchymal echogenicity. Portal vein is patent on color Doppler imaging with normal direction of blood flow towards the liver. IMPRESSION: Thickened and edematous gallbladder wall but no focal tenderness or visible stone - pattern suggestive of reactive thickening. Electronically Signed   By: Tiburcio Pea M.D.   On: 04/10/2021 07:27    EKG: Independently reviewed. Sinus, no st elevation  Assessment/Plan Epigastric abdominal pain Acute cholecystitis     - admit to inpt, tele     -  CCS onboard     - HIDA is positive, awaiting CCS final recs     - continue zosyn     - will need washout period of plavix; have consulted cards for clearance as he has had a recent CABG     - pain control, anti-emetics     - can have CLD  CAD s/p CABG HLD     - continue home regimen     - awaiting clearance from cards; defer plavix supplement to them  HTN     - continue home regimen  Anxiety     - continue home regimen  DM2     - A1c, SSI, glucose checks, CLD  DVT prophylaxis: heparin  Code Status: FULL  Family Communication: none at bedside  Consults called: Cardiology (Dr. Rosemary Holms) and general surgery   Status is: Inpatient  Remains inpatient appropriate because:Inpatient level of care appropriate due to severity of illness  Dispo: The patient is from: Home              Anticipated d/c is to: Home              Patient currently is not medically stable to d/c.   Difficult to place patient No  Time spent  coordinating admission: 60 minutes  Azaliah Carrero A Gonsalo Cuthbertson DO [facility]  If 7PM-7AM, please contact night-coverage [email or web address]  04/10/2021, 1:01 PM

## 2021-04-10 NOTE — Progress Notes (Signed)
Handoff was not on scheduled time due to needing a current temp on pt however, pt was not with ED nurse at that time as he was having a HIDA scan completed. Nurse completed after he returned form his procedure and updated. Signoff was accepted afterwards and hand is scheduled to be made.

## 2021-04-10 NOTE — Progress Notes (Signed)
1mg  of IV morphine WIS with , RN.

## 2021-04-10 NOTE — Progress Notes (Signed)
Pharmacy Antibiotic Note  Fran Mcree is a 50 y.o. male admitted on 04/10/2021 with Intra-abd infection.  Pharmacy has been consulted for Zosyn dosing.  Plan: Zosyn 3.375g IV q8h (4 hour infusion). Renal fx wnl, will sign off protocol, will adjust if needed.  Height: 6' (182.9 cm) Weight: 93 kg (205 lb) IBW/kg (Calculated) : 77.6  Temp (24hrs), Avg:97.9 F (36.6 C), Min:97.6 F (36.4 C), Max:98.2 F (36.8 C)  Recent Labs  Lab 04/10/21 0405  WBC 12.1*  CREATININE 1.05    Estimated Creatinine Clearance: 92.4 mL/min (by C-G formula based on SCr of 1.05 mg/dL).    Allergies  Allergen Reactions   Shrimp [Shellfish Allergy] Swelling    Chest tighten     Antimicrobials this admission: 9/29 Zosyn >>   Dose adjustments this admission:  Microbiology results:  Thank you for allowing pharmacy to be a part of this patient's care.  Otho Bellows PharmD 04/10/2021 4:09 PM

## 2021-04-11 DIAGNOSIS — K810 Acute cholecystitis: Principal | ICD-10-CM

## 2021-04-11 DIAGNOSIS — E1159 Type 2 diabetes mellitus with other circulatory complications: Secondary | ICD-10-CM

## 2021-04-11 LAB — GLUCOSE, CAPILLARY
Glucose-Capillary: 108 mg/dL — ABNORMAL HIGH (ref 70–99)
Glucose-Capillary: 153 mg/dL — ABNORMAL HIGH (ref 70–99)
Glucose-Capillary: 99 mg/dL (ref 70–99)
Glucose-Capillary: 106 mg/dL — ABNORMAL HIGH (ref 70–99)

## 2021-04-11 LAB — HEMOGLOBIN A1C
Hgb A1c MFr Bld: 6.2 % — ABNORMAL HIGH (ref 4.8–5.6)
Mean Plasma Glucose: 131.24 mg/dL

## 2021-04-11 LAB — CBC
HCT: 42.2 % (ref 39.0–52.0)
Hemoglobin: 13.8 g/dL (ref 13.0–17.0)
MCH: 27.2 pg (ref 26.0–34.0)
MCHC: 32.7 g/dL (ref 30.0–36.0)
MCV: 83.2 fL (ref 80.0–100.0)
Platelets: 185 10*3/uL (ref 150–400)
RBC: 5.07 MIL/uL (ref 4.22–5.81)
RDW: 15.1 % (ref 11.5–15.5)
WBC: 11.8 10*3/uL — ABNORMAL HIGH (ref 4.0–10.5)
nRBC: 0 % (ref 0.0–0.2)

## 2021-04-11 LAB — COMPREHENSIVE METABOLIC PANEL
ALT: 46 U/L — ABNORMAL HIGH (ref 0–44)
AST: 36 U/L (ref 15–41)
Albumin: 3.6 g/dL (ref 3.5–5.0)
Alkaline Phosphatase: 94 U/L (ref 38–126)
Anion gap: 7 (ref 5–15)
BUN: 15 mg/dL (ref 6–20)
CO2: 27 mmol/L (ref 22–32)
Calcium: 8.8 mg/dL — ABNORMAL LOW (ref 8.9–10.3)
Chloride: 105 mmol/L (ref 98–111)
Creatinine, Ser: 1.01 mg/dL (ref 0.61–1.24)
GFR, Estimated: >60 mL/min (ref >60)
Glucose, Bld: 135 mg/dL — ABNORMAL HIGH (ref 70–99)
Potassium: 4.6 mmol/L (ref 3.5–5.1)
Sodium: 139 mmol/L (ref 135–145)
Total Bilirubin: 3.2 mg/dL — ABNORMAL HIGH (ref 0.3–1.2)
Total Protein: 7.6 g/dL (ref 6.5–8.1)

## 2021-04-11 LAB — PROTIME-INR
INR: 1.1 (ref 0.8–1.2)
Prothrombin Time: 14.5 seconds (ref 11.4–15.2)

## 2021-04-11 MED ORDER — HYDROMORPHONE HCL 1 MG/ML IJ SOLN
1.0000 mg | INTRAMUSCULAR | Status: DC | PRN
  Administered 2021-04-11 – 2021-04-13 (×7): 1 mg via INTRAVENOUS
  Filled 2021-04-11 (×8): qty 1

## 2021-04-11 MED ORDER — POLYETHYLENE GLYCOL 3350 17 G PO PACK
17.0000 g | PACK | Freq: Every day | ORAL | Status: DC
  Administered 2021-04-11 – 2021-04-16 (×4): 17 g via ORAL
  Filled 2021-04-11 (×3): qty 1

## 2021-04-11 NOTE — Assessment & Plan Note (Addendum)
-   plavix on hold -Continue statin and metoprolol - resume asa and plavix after surgery

## 2021-04-11 NOTE — Plan of Care (Signed)
  Problem: Education: Goal: Knowledge of General Education information will improve Description: Including pain rating scale, medication(s)/side effects and non-pharmacologic comfort measures Outcome: Progressing   Problem: Clinical Measurements: Goal: Will remain free from infection Outcome: Progressing Goal: Diagnostic test results will improve Outcome: Progressing   Problem: Nutrition: Goal: Adequate nutrition will be maintained Outcome: Progressing   Problem: Elimination: Goal: Will not experience complications related to bowel motility Outcome: Progressing   Problem: Pain Managment: Goal: General experience of comfort will improve Outcome: Progressing   Problem: Safety: Goal: Ability to remain free from injury will improve Outcome: Progressing

## 2021-04-11 NOTE — Assessment & Plan Note (Signed)
-   follows with Dr. Jacinto Halim - patient had post-op afib after CABG but no further recurrence; he was taken off of amio and Eliquis and transitioned to asa/plavix

## 2021-04-11 NOTE — Assessment & Plan Note (Addendum)
-   CT A/P and HIDA consistent with acute cholecystitis - plavix remains on hold prior to surgery - continue pain and nausea control  -Pain noted to be uncontrolled overnight, increased Dilaudid dose this morning 10/2 - tentative plan for CCY on Monday with surgery after plavix washout  - continue zosyn  - NPO at MN

## 2021-04-11 NOTE — Progress Notes (Signed)
  Progress Note    Gerald Andrade   PJK:[id number]  DOB: 03/09/1971  DOA: 04/10/2021     1 Date of Service: 04/11/2021   Gerald Andrade is a 50  yo male with PMH CAD s/p CABG (May 2022), HTN, HLD, DMII who presented to the hospital with mid abdominal pain.  He has had no prior episodes similar to this.  He endorsed nausea but no vomiting on admission. He initially underwent CT abdomen/pelvis which showed gallbladder wall thickening with pericholecystic edema followed by HIDA scan which showed cystic duct obstruction consistent with acute cholecystitis. He was evaluated by cardiology for clearance and considered low risk for cholecystectomy.  Home Plavix was held on admission in anticipation of surgery.   Subjective:  No events overnight.  Resting in bed comfortably when seen this morning in no distress.  Still having ongoing mid abdominal pain.  Has some mild nausea but no vomiting.  Understands tentative plan is for Plavix washout with surgery on Monday.  Hospital Problems * Acute cholecystitis - CT A/P and HIDA consistent with acute cholecystitis - plavix remains on hold prior to surgery - continue pain and nausea control  - tentative plan for CCY on Monday with surgery after plavix washout  - continue zosyn   History of atrial fibrillation - follows with Gerald Andrade - patient had post-op afib after CABG but no further recurrence; he was taken off of amio and Eliquis and transitioned to asa/plavix  Type II diabetes mellitus (HCC) - A1c 6.2% - Continue SSI and CBG monitoring  Coronary artery disease  - plavix on hold; will clarify with surgery if aspirin needs to remain on hold -Continue statin and metoprolol    Objective Vital signs were reviewed and unremarkable.  Vitals:   04/10/21 1625 04/10/21 2023 04/11/21 0010 04/11/21 0315  BP: (!) 167/96 138/87 109/68 131/84  Pulse: 85 81 74 77  Resp: 15 12 16 16   Temp: 98.4 F (36.9 C) 98.5 F (36.9 C) 98.3 F (36.8 C) 97.8  F (36.6 C)  TempSrc: Oral     SpO2: 98% 95% 94% 92%  Weight:      Height:       93 kg  Exam General appearance: alert, cooperative, and no distress Head: Normocephalic, without obvious abnormality, atraumatic Eyes:  EOMI Lungs: clear to auscultation bilaterally Heart: regular rate and rhythm and S1, S2 normal Abdomen:  Soft, nondistended.  Tenderness to palpation in epigastrium, no R/G.  Bowel sounds present.  Negative Murphy sign. Extremities:  No edema Skin: mobility and turgor normal Neurologic: Grossly normal    Labs / Other Information My review of labs, imaging, notes and other tests is significant for HIDA scan reviewed showing cystic duct obstruction     Time spent: Greater than 50% of the 35 minute visit was spent in counseling/coordination of care for the patient as laid out in the A&P.  , MD [facility] 04/11/2021, 1:30 PM

## 2021-04-11 NOTE — Hospital Course (Signed)
Gerald Andrade is a 50  yo male with PMH CAD s/p CABG (May 2022), HTN, HLD, DMII who presented to the hospital with mid abdominal pain.  He has had no prior episodes similar to this.  He endorsed nausea but no vomiting on admission. He initially underwent CT abdomen/pelvis which showed gallbladder wall thickening with pericholecystic edema followed by HIDA scan which showed cystic duct obstruction consistent with acute cholecystitis. He was evaluated by cardiology for clearance and considered low risk for cholecystectomy.  Home Plavix was held on admission in anticipation of surgery.

## 2021-04-11 NOTE — Assessment & Plan Note (Signed)
-   A1c 6.2% - Continue SSI and CBG monitoring

## 2021-04-11 NOTE — Progress Notes (Signed)
Assessment & Plan: Cholecystitis  HIDA scan positive with non-vis of gallbladder  Persistent symptoms of RUQ pain  Empiric abx's  Holding Plavix and ASA CAD  Cleared by cardiology  Tentatively plan OR on Monday 10/3 (5 days off of Plavix).  Discussed with patient who agrees with plan.        Gerald Level, MD       [facility]       Office: [phone]   Chief Complaint: Abdominal pain  Subjective: Patient in bed, comfortable.  Didn't sleep much.  Objective: Vital signs in last 24 hours: Temp:  [97.8 F (36.6 C)-98.5 F (36.9 C)] 97.8 F (36.6 C) (09/30 0315) Pulse Rate:  [73-90] 77 (09/30 0315) Resp:  [12-16] 16 (09/30 0315) BP: (109-167)/(68-96) 131/84 (09/30 0315) SpO2:  [92 %-98 %] 92 % (09/30 0315) Last BM Date: 04/09/21  Intake/Output from previous day: No intake/output data recorded. Intake/Output this shift: No intake/output data recorded.  Physical Exam: HEENT - sclerae clear, mucous membranes moist Neck - soft Chest - clear bilaterally Cor - RRR Abdomen - soft without distension; non-tender in epigastrium Ext - no edema, non-tender Neuro - alert & oriented, no focal deficits  Lab Results:  Recent Labs    04/10/21 0405 04/11/21 0643  WBC 12.1* 11.8*  HGB 14.1 13.8  HCT 43.1 42.2  PLT 242 185   BMET Recent Labs    04/10/21 0405 04/11/21 0643  NA 140 139  K 4.4 4.6  CL 105 105  CO2 27 27  GLUCOSE 202* 135*  BUN 13 15  CREATININE 1.05 1.01  CALCIUM 9.3 8.8*   PT/INR Recent Labs    04/11/21 0643  LABPROT 14.5  INR 1.1   Comprehensive Metabolic Panel:    Component Value Date/Time   NA 139 04/11/2021 0643   NA 140 04/10/2021 0405   K 4.6 04/11/2021 0643   K 4.4 04/10/2021 0405   CL 105 04/11/2021 0643   CL 105 04/10/2021 0405   CO2 27 04/11/2021 0643   CO2 27 04/10/2021 0405   BUN 15 04/11/2021 0643   BUN 13 04/10/2021 0405   CREATININE 1.01 04/11/2021 0643   CREATININE 1.05 04/10/2021 0405    GLUCOSE 135 (H) 04/11/2021 0643   GLUCOSE 202 (H) 04/10/2021 0405   CALCIUM 8.8 (L) 04/11/2021 0643   CALCIUM 9.3 04/10/2021 0405   AST 36 04/11/2021 0643   AST 18 04/10/2021 0405   ALT 46 (H) 04/11/2021 0643   ALT 15 04/10/2021 0405   ALKPHOS 94 04/11/2021 0643   ALKPHOS 90 04/10/2021 0405   BILITOT 3.2 (H) 04/11/2021 0643   BILITOT 1.3 (H) 04/10/2021 0405   PROT 7.6 04/11/2021 0643   PROT 7.7 04/10/2021 0405   ALBUMIN 3.6 04/11/2021 0643   ALBUMIN 4.2 04/10/2021 0405    Studies/Results: NM Hepatobiliary Liver Func  Result Date: 04/10/2021 CLINICAL DATA:  Abdominal pain.  Thickened gallbladder wall EXAM: NUCLEAR MEDICINE HEPATOBILIARY IMAGING TECHNIQUE: Sequential images of the abdomen were obtained out to 60 minutes following intravenous administration of radiopharmaceutical. RADIOPHARMACEUTICALS:  4.8 mCi Tc-21m  Choletec IV COMPARISON:  Ultrasound and CT 04/10/2021 FINDINGS: Prompt clearance radiotracer from blood pool and homogeneous uptake in liver. Counts are evident within the proximal small bowel 10 minutes. Gallbladder fails to fill at 45 minutes. IV morphine was administered to augment filling of the gallbladder. The gallbladder fails to fill after morphine stimulation. IMPRESSION: Non filling of the gallbladder is consistent with cystic duct obstruction (ACUTE  CHOLECYSTITIS). Patent common bile duct. These results will be called to the ordering clinician or representative by the Radiologist Assistant, and communication documented in the PACS or [employer]. Electronically Signed   By: Genevive Bi M.D.   On: 04/10/2021 15:35   CT CHEST ABDOMEN PELVIS W CONTRAST  Result Date: 04/10/2021 CLINICAL DATA:  Epigastric pain for 1 week.  Elevated lipase EXAM: CT CHEST, ABDOMEN, AND PELVIS WITH CONTRAST TECHNIQUE: Multidetector CT imaging of the chest, abdomen and pelvis was performed following the standard protocol during bolus administration of intravenous contrast. CONTRAST:   58mL OMNIPAQUE IOHEXOL 350 MG/ML SOLN COMPARISON:  Chest CT 11/12/2020 FINDINGS: CT CHEST FINDINGS Cardiovascular: Borderline heart size. Anterior mediastinal fat stranding related to CABG. Scattered atheromatous calcifications. Mediastinum/Nodes: No hematoma or pneumomediastinum. Lungs/Pleura: Small, thick walled pleural effusion on the left. Thoracentesis was performed last month. Adjacent subpleural pulmonary opacity with swirling attributed to developing round atelectasis. Musculoskeletal: Unremarkable sternotomy incision. No acute or aggressive finding. CT ABDOMEN PELVIS FINDINGS Hepatobiliary: No focal liver abnormality.Thick walled gallbladder without over distension or clear stone. Pancreas: Unremarkable. Spleen: Unremarkable. Adrenals/Urinary Tract: Negative adrenals. No hydronephrosis or stone. Unremarkable bladder. Stomach/Bowel: No obstruction. No appendicitis. Scattered colonic diverticula distally. Vascular/Lymphatic: No acute vascular abnormality. No mass or adenopathy. Reproductive:No pathologic findings. Other: No ascites or pneumoperitoneum. Small periumbilical hernia which is fatty. Musculoskeletal: No acute abnormalities. IMPRESSION: 1. Gallbladder wall thickening/pericholecystic edema, please correlate for cholecystitis symptoms. 2. Chronic, small left pleural effusion with adjacent round atelectasis. 3. Additional incidental findings noted above. Electronically Signed   By: Tiburcio Pea M.D.   On: 04/10/2021 06:09   DG Chest Portable 1 View  Result Date: 04/10/2021 CLINICAL DATA:  Chest pain with nausea EXAM: PORTABLE CHEST 1 VIEW COMPARISON:  03/24/2021 FINDINGS: Chronic cardiomegaly. Prior CABG. Improved aeration at the left base where there is likely decreased blunting/obscuration of the lateral left costophrenic sulcus. No edema or pneumothorax. IMPRESSION: Improved aeration at the left base.  No new abnormality. Electronically Signed   By: Tiburcio Pea M.D.   On: 04/10/2021 04:53    US Abdomen Limited RUQ (LIVER/GB)  Result Date: 04/10/2021 CLINICAL DATA:  Right upper quadrant pain EXAM: ULTRASOUND ABDOMEN LIMITED RIGHT UPPER QUADRANT COMPARISON:  Preceding abdominal CT FINDINGS: Gallbladder: Wall thickening and heterogeneity with wall thickness measured up to 8 mm. No focal tenderness. Common bile duct: Diameter: 5 mm Liver: No focal lesion identified. Within normal limits in parenchymal echogenicity. Portal vein is patent on color Doppler imaging with normal direction of blood flow towards the liver. IMPRESSION: Thickened and edematous gallbladder wall but no focal tenderness or visible stone - pattern suggestive of reactive thickening. Electronically Signed   By: Tiburcio Pea M.D.   On: 04/10/2021 07:27      Gerald Andrade 04/11/2021

## 2021-04-12 LAB — GLUCOSE, CAPILLARY
Glucose-Capillary: 82 mg/dL (ref 70–99)
Glucose-Capillary: 104 mg/dL — ABNORMAL HIGH (ref 70–99)
Glucose-Capillary: 96 mg/dL (ref 70–99)
Glucose-Capillary: 133 mg/dL — ABNORMAL HIGH (ref 70–99)

## 2021-04-12 MED ORDER — LACTATED RINGERS IV SOLN: INTRAVENOUS | Status: DC

## 2021-04-12 NOTE — Progress Notes (Signed)
  Progress Note    [name]   VFI:[id number]  DOB: [birth year]  DOA: 04/10/2021     2 Date of Service: 04/12/2021   [name] is a 50  yo male with PMH CAD s/p CABG (May 2022), HTN, HLD, DMII who presented to the hospital with mid abdominal pain.  He has had no prior episodes similar to this.  He endorsed nausea but no vomiting on admission. He initially underwent CT abdomen/pelvis which showed gallbladder wall thickening with pericholecystic edema followed by HIDA scan which showed cystic duct obstruction consistent with acute cholecystitis. He was evaluated by cardiology for clearance and considered low risk for cholecystectomy.  Home Plavix was held on admission in anticipation of surgery.   Subjective:  No events since yesterday.  Wife present bedside this morning.  Still feels about the same with ongoing abdominal pain in the midline that is worse with movement.  No vomiting but still having intermittent nausea.  Hospital Problems * Acute cholecystitis - CT A/P and HIDA consistent with acute cholecystitis - plavix remains on hold prior to surgery - continue pain and nausea control  - tentative plan for CCY on Monday with surgery after plavix washout  - continue zosyn   History of atrial fibrillation - follows with Dr. Jacinto Halim - patient had post-op afib after CABG but no further recurrence; he was taken off of amio and Eliquis and transitioned to asa/plavix  Type II diabetes mellitus (HCC) - A1c 6.2% - Continue SSI and CBG monitoring  Coronary artery disease  - plavix on hold; will clarify with surgery if aspirin needs to remain on hold -Continue statin and metoprolol    Objective Vital signs were reviewed and unremarkable.  Vitals:   04/11/21 0315 04/11/21 1439 04/11/21 2052 04/12/21 0434  BP: 131/84 132/81 129/76 131/82  Pulse: 77 74 (!) 103 80  Resp: 16 15 20 20   Temp: 97.8 F (36.6 C) 98.4 F (36.9 C) 99.3 F (37.4 C) 98.4 F (36.9 C)  TempSrc:   Oral Oral Oral  SpO2: 92% 94% 92% 97%  Weight:      Height:       93 kg  Exam General appearance: alert, cooperative, and no distress Head: Normocephalic, without obvious abnormality, atraumatic Eyes:  EOMI Lungs: clear to auscultation bilaterally Heart: regular rate and rhythm and S1, S2 normal Abdomen:  Soft, nondistended.  Tenderness to palpation in epigastrium, no R/G.  Bowel sounds present.  Negative Murphy sign. Extremities:  No edema Skin: mobility and turgor normal Neurologic: Grossly normal    Labs / Other Information My review of labs, imaging, notes and other tests is significant for HIDA scan reviewed showing cystic duct obstruction     Time spent: Greater than 50% of the 35 minute visit was spent in counseling/coordination of care for the patient as laid out in the A&P.  , MD [facility] 04/12/2021, 12:32 PM

## 2021-04-12 NOTE — Progress Notes (Signed)
Subjective/Chief Complaint: Pain when I eat Patient in bed.  He is doing okay except when he drinks fluids and he then developed some abdominal pain.  The pain comes and goes.  It is about the same as yesterday.   Objective: Vital signs in last 24 hours: Temp:  [98.4 F (36.9 C)-99.3 F (37.4 C)] 98.4 F (36.9 C) (10/01 0434) Pulse Rate:  [74-103] 80 (10/01 0434) Resp:  [15-20] 20 (10/01 0434) BP: (129-132)/(76-82) 131/82 (10/01 0434) SpO2:  [92 %-97 %] 97 % (10/01 0434) Last BM Date: 04/09/21  Intake/Output from previous day: 09/30 0701 - 10/01 0700 In: 150 [IV Piggyback:150] Out: -  Intake/Output this shift: No intake/output data recorded.  General appearance: alert and cooperative Resp: clear to auscultation bilaterally GI: Tender right upper quadrant.  Mild.  Lab Results:  Recent Labs    04/10/21 0405 04/11/21 0643  WBC 12.1* 11.8*  HGB 14.1 13.8  HCT 43.1 42.2  PLT 242 185   BMET Recent Labs    04/10/21 0405 04/11/21 0643  NA 140 139  K 4.4 4.6  CL 105 105  CO2 27 27  GLUCOSE 202* 135*  BUN 13 15  CREATININE 1.05 1.01  CALCIUM 9.3 8.8*   PT/INR Recent Labs    04/11/21 0643  LABPROT 14.5  INR 1.1   ABG No results for input(s): PHART, HCO3 in the last 72 hours.  Invalid input(s): PCO2, PO2  Studies/Results: NM Hepatobiliary Liver Func  Result Date: 04/10/2021 CLINICAL DATA:  Abdominal pain.  Thickened gallbladder wall EXAM: NUCLEAR MEDICINE HEPATOBILIARY IMAGING TECHNIQUE: Sequential images of the abdomen were obtained out to 60 minutes following intravenous administration of radiopharmaceutical. RADIOPHARMACEUTICALS:  4.8 mCi Tc-39m  Choletec IV COMPARISON:  Ultrasound and CT 04/10/2021 FINDINGS: Prompt clearance radiotracer from blood pool and homogeneous uptake in liver. Counts are evident within the proximal small bowel 10 minutes. Gallbladder fails to fill at 45 minutes. IV morphine was administered to augment filling of the gallbladder.  The gallbladder fails to fill after morphine stimulation. IMPRESSION: Non filling of the gallbladder is consistent with cystic duct obstruction (ACUTE CHOLECYSTITIS). Patent common bile duct. These results will be called to the ordering clinician or representative by the Radiologist Assistant, and communication documented in the PACS or [employer]. Electronically Signed   By: Genevive Bi M.D.   On: 04/10/2021 15:35    Anti-infectives: Anti-infectives (From admission, onward)    Start     Dose/Rate Route Frequency Ordered Stop   04/10/21 1700  piperacillin-tazobactam (ZOSYN) IVPB 3.375 g        3.375 g 12.5 mL/hr over 240 Minutes Intravenous Every 8 hours 04/10/21 1631     04/10/21 0730  piperacillin-tazobactam (ZOSYN) IVPB 3.375 g        3.375 g 100 mL/hr over 30 Minutes Intravenous  Once 04/10/21 0719 04/10/21 0909       Assessment/Plan:  Cholecystitis             HIDA scan positive with non-vis of gallbladder             Persistent symptoms of RUQ pain             Empiric abx's             Holding Plavix and ASA CAD             Cleared by cardiology   Tentatively plan OR on Monday 10/3 (5 days off of Plavix).  Discussed with patient who agrees with  plan.  LOS: 2 days    Dortha Schwalbe MD  04/12/2021

## 2021-04-13 LAB — SURGICAL PCR SCREEN
MRSA, PCR: NEGATIVE
Staphylococcus aureus: NEGATIVE

## 2021-04-13 LAB — GLUCOSE, CAPILLARY
Glucose-Capillary: 102 mg/dL — ABNORMAL HIGH (ref 70–99)
Glucose-Capillary: 123 mg/dL — ABNORMAL HIGH (ref 70–99)
Glucose-Capillary: 112 mg/dL — ABNORMAL HIGH (ref 70–99)
Glucose-Capillary: 98 mg/dL (ref 70–99)

## 2021-04-13 MED ORDER — MUPIROCIN 2 % EX OINT
1.0000 "application " | TOPICAL_OINTMENT | Freq: Two times a day (BID) | CUTANEOUS | Status: DC
  Administered 2021-04-13 – 2021-04-16 (×4): 1 via NASAL
  Filled 2021-04-13: qty 22

## 2021-04-13 MED ORDER — CHLORHEXIDINE GLUCONATE CLOTH 2 % EX PADS: 6.0000 | MEDICATED_PAD | Freq: Once | CUTANEOUS | Status: DC

## 2021-04-13 MED ORDER — HYDROMORPHONE HCL 1 MG/ML IJ SOLN
2.0000 mg | INTRAMUSCULAR | Status: DC | PRN
  Administered 2021-04-14 – 2021-04-16 (×7): 2 mg via INTRAVENOUS
  Filled 2021-04-13 (×8): qty 2

## 2021-04-13 MED ORDER — CHLORHEXIDINE GLUCONATE CLOTH 2 % EX PADS
6.0000 | MEDICATED_PAD | Freq: Once | CUTANEOUS | Status: AC
  Administered 2021-04-14: 6 via TOPICAL

## 2021-04-13 MED ORDER — OXYCODONE HCL 5 MG PO TABS
10.0000 mg | ORAL_TABLET | ORAL | Status: DC | PRN
  Administered 2021-04-13 – 2021-04-15 (×8): 10 mg via ORAL
  Filled 2021-04-13 (×8): qty 2

## 2021-04-13 MED ORDER — CEFAZOLIN SODIUM-DEXTROSE 2-4 GM/100ML-% IV SOLN
2.0000 g | INTRAVENOUS | Status: DC
  Filled 2021-04-13 (×2): qty 100

## 2021-04-13 NOTE — Progress Notes (Signed)
  Progress Note    Gerald Andrade   EQA:[id number]  DOB: 05-May-1971  DOA: 04/10/2021     3 Date of Service: 04/13/2021   Gerald Andrade is a 50  yo male with PMH CAD s/p CABG (May 2022), HTN, HLD, DMII who presented to the hospital with mid abdominal pain.  He has had no prior episodes similar to this.  He endorsed nausea but no vomiting on admission. He initially underwent CT abdomen/pelvis which showed gallbladder wall thickening with pericholecystic edema followed by HIDA scan which showed cystic duct obstruction consistent with acute cholecystitis. He was evaluated by cardiology for clearance and considered low risk for cholecystectomy.  Home Plavix was held on admission in anticipation of surgery.   Subjective:  Increased amount of pain in his abdomen overnight and has transitioned to his right upper quadrant at this time.  Denies vomiting but still having ongoing nausea and discomfort with much movement.  Wife present bedside this morning.  We discussed increasing his dose of Dilaudid which he was amenable with.  Hospital Problems * Acute cholecystitis - CT A/P and HIDA consistent with acute cholecystitis - plavix remains on hold prior to surgery - continue pain and nausea control  -Pain noted to be uncontrolled overnight, increased Dilaudid dose this morning 10/2 - tentative plan for CCY on Monday with surgery after plavix washout  - continue zosyn  - NPO at MN  History of atrial fibrillation - follows with Dr. Jacinto Halim - patient had post-op afib after CABG but no further recurrence; he was taken off of amio and Eliquis and transitioned to asa/plavix  Type II diabetes mellitus (HCC) - A1c 6.2% - Continue SSI and CBG monitoring  Coronary artery disease  - plavix on hold -Continue statin and metoprolol - resume asa and plavix after surgery    Objective Vital signs were reviewed and unremarkable.  Vitals:   04/12/21 1335 04/12/21 2024 04/13/21 0425 04/13/21 0829  BP:  121/83 134/81 132/85 129/83  Pulse: 75 89 93 77  Resp: 20 16 16 20   Temp: 98.4 F (36.9 C) 99.5 F (37.5 C) 99.3 F (37.4 C) 99.1 F (37.3 C)  TempSrc: Oral Oral Oral Oral  SpO2: 97% 95% 93% 94%  Weight:      Height:       93 kg  Exam General appearance: alert, cooperative, and no distress Head: Normocephalic, without obvious abnormality, atraumatic Eyes:  EOMI Lungs: clear to auscultation bilaterally Heart: regular rate and rhythm and S1, S2 normal Abdomen:  Now more tender in right upper quadrant with mild guarding, no rebound.  Less tenderness in epigastrium.  Soft, bowel sounds present Extremities:  No edema Skin: mobility and turgor normal Neurologic: Grossly normal    Labs / Other Information My review of labs, imaging, notes and other tests is significant for HIDA scan reviewed showing cystic duct obstruction     Time spent: Greater than 50% of the 35 minute visit was spent in counseling/coordination of care for the patient as laid out in the A&P.  , MD [facility] 04/13/2021, 12:10 PM

## 2021-04-13 NOTE — Progress Notes (Signed)
Pt is unable to sleep due to increase pain in upper right abd quad. Despite medication interventions of PRN pain meds, valium. Pt stats "this is the worst the pain has been since he got here."

## 2021-04-14 ENCOUNTER — Inpatient Hospital Stay (HOSPITAL_COMMUNITY): Payer: Self-pay | Admitting: Anesthesiology

## 2021-04-14 ENCOUNTER — Encounter (HOSPITAL_COMMUNITY)
Admission: EM | Disposition: A | Discharge status: Home / Self Care | Payer: Self-pay | Source: Home / Self Care | Attending: Internal Medicine

## 2021-04-14 ENCOUNTER — Encounter (HOSPITAL_COMMUNITY): Payer: Self-pay | Admitting: Internal Medicine

## 2021-04-14 DIAGNOSIS — Z8679 Personal history of other diseases of the circulatory system: Secondary | ICD-10-CM

## 2021-04-14 DIAGNOSIS — I25110 Atherosclerotic heart disease of native coronary artery with unstable angina pectoris: Secondary | ICD-10-CM

## 2021-04-14 LAB — COMPREHENSIVE METABOLIC PANEL
ALT: 25 U/L (ref 0–44)
AST: 19 U/L (ref 15–41)
Albumin: 3.3 g/dL — ABNORMAL LOW (ref 3.5–5.0)
Alkaline Phosphatase: 131 U/L — ABNORMAL HIGH (ref 38–126)
Anion gap: 8 (ref 5–15)
BUN: 12 mg/dL (ref 6–20)
CO2: 26 mmol/L (ref 22–32)
Calcium: 8.6 mg/dL — ABNORMAL LOW (ref 8.9–10.3)
Chloride: 100 mmol/L (ref 98–111)
Creatinine, Ser: 0.81 mg/dL (ref 0.61–1.24)
GFR, Estimated: >60 mL/min (ref >60)
Glucose, Bld: 98 mg/dL (ref 70–99)
Potassium: 4.6 mmol/L (ref 3.5–5.1)
Sodium: 134 mmol/L — ABNORMAL LOW (ref 135–145)
Total Bilirubin: 2.6 mg/dL — ABNORMAL HIGH (ref 0.3–1.2)
Total Protein: 7.5 g/dL (ref 6.5–8.1)

## 2021-04-14 LAB — CBC WITH DIFFERENTIAL/PLATELET
Abs Immature Granulocytes: 0.03 10*3/uL (ref 0.00–0.07)
Basophils Absolute: 0 10*3/uL (ref 0.0–0.1)
Basophils Relative: 0 %
Eosinophils Absolute: 0.1 10*3/uL (ref 0.0–0.5)
Eosinophils Relative: 1 %
HCT: 40 % (ref 39.0–52.0)
Hemoglobin: 13.1 g/dL (ref 13.0–17.0)
Immature Granulocytes: 0 %
Lymphocytes Relative: 17 %
Lymphs Abs: 1.4 10*3/uL (ref 0.7–4.0)
MCH: 27 pg (ref 26.0–34.0)
MCHC: 32.8 g/dL (ref 30.0–36.0)
MCV: 82.5 fL (ref 80.0–100.0)
Monocytes Absolute: 1.1 10*3/uL — ABNORMAL HIGH (ref 0.1–1.0)
Monocytes Relative: 13 %
Neutro Abs: 5.7 10*3/uL (ref 1.7–7.7)
Neutrophils Relative %: 69 %
Platelets: 223 10*3/uL (ref 150–400)
RBC: 4.85 MIL/uL (ref 4.22–5.81)
RDW: 14.6 % (ref 11.5–15.5)
WBC: 8.3 10*3/uL (ref 4.0–10.5)
nRBC: 0 % (ref 0.0–0.2)

## 2021-04-14 LAB — GLUCOSE, CAPILLARY
Glucose-Capillary: 100 mg/dL — ABNORMAL HIGH (ref 70–99)
Glucose-Capillary: 110 mg/dL — ABNORMAL HIGH (ref 70–99)
Glucose-Capillary: 97 mg/dL (ref 70–99)

## 2021-04-14 LAB — MAGNESIUM: Magnesium: 2.2 mg/dL (ref 1.7–2.4)

## 2021-04-14 SURGERY — LAPAROSCOPIC CHOLECYSTECTOMY WITH INTRAOPERATIVE CHOLANGIOGRAM
Anesthesia: General

## 2021-04-14 MED ORDER — PROPOFOL 10 MG/ML IV BOLUS
INTRAVENOUS | Status: AC
  Filled 2021-04-14: qty 20

## 2021-04-14 MED ORDER — ONDANSETRON HCL 4 MG/2ML IJ SOLN
INTRAMUSCULAR | Status: AC
  Filled 2021-04-14: qty 2

## 2021-04-14 MED ORDER — CHLORHEXIDINE GLUCONATE 0.12 % MT SOLN
15.0000 mL | Freq: Once | OROMUCOSAL | Status: AC
  Administered 2021-04-14 – 2021-04-15 (×2): 15 mL via OROMUCOSAL

## 2021-04-14 MED ORDER — LACTATED RINGERS IV SOLN: INTRAVENOUS | Status: DC

## 2021-04-14 MED ORDER — FAMOTIDINE IN NACL 20-0.9 MG/50ML-% IV SOLN
20.0000 mg | INTRAVENOUS | Status: DC
  Administered 2021-04-14 – 2021-04-16 (×3): 20 mg via INTRAVENOUS
  Filled 2021-04-14 (×3): qty 50

## 2021-04-14 MED ORDER — ROCURONIUM BROMIDE 10 MG/ML (PF) SYRINGE
PREFILLED_SYRINGE | INTRAVENOUS | Status: AC
  Filled 2021-04-14: qty 10

## 2021-04-14 MED ORDER — BUPIVACAINE-EPINEPHRINE (PF) 0.25% -1:200000 IJ SOLN
INTRAMUSCULAR | Status: AC
  Filled 2021-04-14: qty 30

## 2021-04-14 MED ORDER — MIDAZOLAM HCL 2 MG/2ML IJ SOLN
INTRAMUSCULAR | Status: AC
  Filled 2021-04-14: qty 2

## 2021-04-14 MED ORDER — LIDOCAINE HCL (PF) 2 % IJ SOLN
INTRAMUSCULAR | Status: AC
  Filled 2021-04-14: qty 5

## 2021-04-14 MED ORDER — DEXAMETHASONE SODIUM PHOSPHATE 10 MG/ML IJ SOLN
INTRAMUSCULAR | Status: AC
  Filled 2021-04-14: qty 1

## 2021-04-14 MED ORDER — FENTANYL CITRATE (PF) 250 MCG/5ML IJ SOLN
INTRAMUSCULAR | Status: AC
  Filled 2021-04-14: qty 5

## 2021-04-14 NOTE — Progress Notes (Signed)
Dr.Dashon spoke to patient about surgery today and whether to proceed or not due to power issues in the OR. It was decided to post pone for now and possibly do surgery tomorrow 04/15/21.  Called floor nurse, she was unavailable and so told charge nurse the patient would be returning to the floor,room 1503 shortly.  Charge nurse voiced understanding.

## 2021-04-14 NOTE — Progress Notes (Signed)
Received report from [facility] for surgery. Short stay NT on the way to get patient from floor now.

## 2021-04-14 NOTE — Progress Notes (Signed)
Day of Surgery   Subjective/Chief Complaint: Pain when I eat Patient in bed.  He is doing okay. The pain comes and goes.  It is about the same as yesterday.   Objective: Vital signs in last 24 hours: Temp:  [98 F (36.7 C)-98.9 F (37.2 C)] 98.6 F (37 C) (10/03 0947) Pulse Rate:  [74-94] 94 (10/03 0947) Resp:  [16-18] 18 (10/03 0947) BP: (134-167)/(86-97) 167/97 (10/03 0947) SpO2:  [94 %-98 %] 97 % (10/03 0947) Weight:  [93 kg] 93 kg (10/03 0950) Last BM Date: 04/08/21  Intake/Output from previous day: No intake/output data recorded. Intake/Output this shift: No intake/output data recorded.  General appearance: alert and cooperative Resp: clear to auscultation bilaterally GI: Tender right upper quadrant.  Mild.  Lab Results:  Recent Labs    04/14/21 0442  WBC 8.3  HGB 13.1  HCT 40.0  PLT 223    BMET Recent Labs    04/14/21 0442  NA 134*  K 4.6  CL 100  CO2 26  GLUCOSE 98  BUN 12  CREATININE 0.81  CALCIUM 8.6*    PT/INR No results for input(s): LABPROT, INR in the last 72 hours.  ABG No results for input(s): PHART, HCO3 in the last 72 hours.  Invalid input(s): PCO2, PO2  Studies/Results: No results found.  Anti-infectives: Anti-infectives (From admission, onward)    Start     Dose/Rate Route Frequency Ordered Stop   04/14/21 0600  ceFAZolin (ANCEF) IVPB 2g/100 mL premix        2 g 200 mL/hr over 30 Minutes Intravenous On call to O.R. 04/13/21 1617 04/15/21 0559   04/10/21 1700  [MAR Hold]  piperacillin-tazobactam (ZOSYN) IVPB 3.375 g        (MAR Hold since Mon 04/14/2021 at 0934.Hold Reason: Transfer to a Procedural area)   3.375 g 12.5 mL/hr over 240 Minutes Intravenous Every 8 hours 04/10/21 1631     04/10/21 0730  piperacillin-tazobactam (ZOSYN) IVPB 3.375 g        3.375 g 100 mL/hr over 30 Minutes Intravenous  Once 04/10/21 0719 04/10/21 0909       Assessment/Plan:  Cholecystitis             HIDA scan positive with non-vis of  gallbladder             Persistent symptoms of RUQ pain             Empiric abx's             Holding Plavix and ASA CAD             Cleared by cardiology   plan OR today (5 days off of Plavix).  The anatomy & physiology of hepatobiliary & pancreatic function was discussed.  The pathophysiology of gallbladder dysfunction was discussed.  Natural history risks without surgery was discussed.   I feel the risks of no intervention will lead to serious problems that outweigh the operative risks; therefore, I recommended cholecystectomy to remove the pathology.  I explained laparoscopic techniques with possible need for an open approach.  Probable cholangiogram to evaluate the bilary tract was explained as well.    Risks such as bleeding, infection, abscess, leak, injury to other organs, need for further treatment, heart attack, death, and other risks were discussed.  I noted a good likelihood this will help address the problem.  Possibility that this will not correct all abdominal symptoms was explained.  Goals of post-operative recovery were discussed as well.  We will work to minimize complications.  An educational handout further explaining the pathology and treatment options was given as well.  Questions were answered.  The patient expresses understanding & wishes to proceed with surgery.    LOS: 4 days    Vanita Panda MD  04/14/2021

## 2021-04-14 NOTE — Anesthesia Preprocedure Evaluation (Addendum)
Anesthesia Evaluation  Patient identified by MRN, date of birth, ID band Patient awake    Reviewed: Allergy & Precautions, NPO status , Patient's Chart, lab work & pertinent test results, reviewed documented beta blocker date and time   History of Anesthesia Complications Negative for: history of anesthetic complications  Airway Mallampati: I  TM Distance: >3 FB Neck ROM: Full    Dental  (+) Edentulous Upper, Edentulous Lower, Dental Advisory Given   Pulmonary neg pulmonary ROS, former smoker,    Pulmonary exam normal        Cardiovascular hypertension, Pt. on home beta blockers + CABG   Rhythm:Regular Rate:Tachycardia  Left Ventricle: The left ventricle has mildly reduced systolic function,  with an ejection fraction of 45-50%. The cavity size was normal. Mild  hypokinesis of the left ventricular, basal inferolateral wall. There is no  left ventricular hypertrophy. Left  ventricular diastolic parameters were normal.    Neuro/Psych negative neurological ROS     GI/Hepatic negative GI ROS, Neg liver ROS,   Endo/Other  diabetes  Renal/GU negative Renal ROS     Musculoskeletal negative musculoskeletal ROS (+)   Abdominal   Peds  Hematology negative hematology ROS (+)   Anesthesia Other Findings   Reproductive/Obstetrics                           Anesthesia Physical Anesthesia Plan  ASA: 3  Anesthesia Plan: General   Post-op Pain Management:    Induction: Intravenous  PONV Risk Score and Plan: 3 and Ondansetron, Dexamethasone and Midazolam  Airway Management Planned: Oral ETT  Additional Equipment:   Intra-op Plan:   Post-operative Plan: Extubation in OR  Informed Consent: I have reviewed the patients History and Physical, chart, labs and discussed the procedure including the risks, benefits and alternatives for the proposed anesthesia with the patient or authorized  representative who has indicated his/her understanding and acceptance.     Dental advisory given  Plan Discussed with: Anesthesiologist  Anesthesia Plan Comments:        Anesthesia Quick Evaluation

## 2021-04-14 NOTE — Progress Notes (Signed)
PROGRESS NOTE    Jhase Creppel  OMV:[id number] DOB: Dec 29, 1970 DOA: 04/10/2021 PCP: Pcp, No    Brief Narrative:  Mr. Wilkie was admitted to the hospital with the working diagnosis of acute cholecystitis. Waiting for cholecystectomy.   50 year old male past medical history for hypertension, dyslipidemia, coronary artery disease and type 2 diabetes mellitus who presented abdominal pain.  Reported epigastric infraumbilical pain lasting for about 24 hours, stool in nature, associated with nausea but no vomiting.  Because of worsening pain he presented to the emergency department.  On his initial physical examination blood pressure 154/89, heart rate 82, respiratory rate 16, oxygen saturation 92%, his lungs are clear to auscultation bilaterally, heart S1-S2, present, rhythmic, abdomen was nondistended, no organomegaly, no lower extremity edema.  Sodium 140, potassium 4.4, chloride 105, bicarb 27, glucose 202, BUN 13, creatinine 1.0, lipase 54, AST 18, ALT 15, total bili was 1.3, white count 12.1, hemoglobin 14.1, hematocrit 43.1, platelets 242. SARS COVID-19 negative.  Urinalysis [place] gravity 1.015.  Chest radiograph no infiltrates.  EKG 58 bpm, rightward axis, right bundle branch block, sinus rhythm, Q-wave lead II-III-aVF, no significant ST segment or T wave changes.  CT of the abdomen pelvis with gallbladder wall thickening, pericholecystic edema, and chronic small left pleural effusion.  HIDA scan positive for cholecystitis.   Assessment & Plan:   Principal Problem:   Acute cholecystitis Active Problems:   Coronary artery disease    Type II diabetes mellitus (HCC)   History of atrial fibrillation   Acute cholecystitis. Patient continue to have right upper abdominal pain. No nausea or vomiting.  Surgery was cancelled today, power loss.  Diet resumed for today and plan for NPO past midnight.  Wbc is 8,3 today.   Plan to continue supportive medical therapy. Pain  control with hydromorphone IV, continue with antibiotic therapy with Zosyn As needed antiemetics and scheduled antiacids.  Continue to hold on clopidogrel.  Gentle hydration with balanced electrolyte solution, decrease rate to 50 ml per hr.   2. Atrial fibrillation (paroxysmal) Rate remains well controlled with metoprolol. Holding anticoagulation in preparation for surgical intervention.   3. T2DM/ dyslipidemia.  Fasting glucose this am 98 mg/dl. Continue glucose control with insulin sliding scale. Continue with statin therapy.   4, CAD. Continue Beta blockade with metoprolol, holding antiplatelet therapy with asa and clopidogrel.   5. Anxiety. Continue with diazepam qhs.   Patient continue to be at high risk for worsening cholecystitis.   Status is: Inpatient  Remains inpatient appropriate because:Inpatient level of care appropriate due to severity of illness  Dispo: The patient is from: Home              Anticipated d/c is to: Home              Patient currently is not medically stable to d/c.   Difficult to place patient No   DVT prophylaxis: Scd   Code Status:    full  Family Communication:  I spoke with patient's wife at the bedside, we talked in detail about patient's condition, plan of care and prognosis and all questions were addressed.     Consultants:  Surgery     Antimicrobials:  Zosyn     Subjective: Patient with abdominal pain right upper quadrant, no nausea or vomiting. Pain worse with movement and to touch, severe in intensity but improved with IV analgesics.   Objective: Vitals:   04/13/21 2044 04/14/21 0505 04/14/21 0947 04/14/21 0950  BP: 136/87 137/87 (!) 167/97  Pulse: 89 90 94   Resp: 16 16 18    Temp: 98.9 F (37.2 C) 98 F (36.7 C) 98.6 F (37 C)   TempSrc: Oral Oral Oral   SpO2: 94% 96% 97%   Weight:    93 kg  Height:    6' (1.829 m)   No intake or output data in the 24 hours ending 04/14/21 1309 Filed Weights   04/10/21 0339  04/14/21 0950  Weight: 93 kg 93 kg    Examination:   General: deconditioned, positive pain with movement,.  Neurology: Awake and alert, non focal  E ENT: no pallor, no icterus, oral mucosa moist Cardiovascular: No JVD. S1-S2 present, rhythmic, no gallops, rubs, or murmurs. No lower extremity edema. Pulmonary:  positive breath sounds bilaterally, adequate air movement, no wheezing, rhonchi or rales. Gastrointestinal. Abdomen mild distended, not tender to superficial palpation Skin. No rashes Musculoskeletal: no joint deformities     Data Reviewed: I have personally reviewed following labs and imaging studies  CBC: Recent Labs  Lab 04/10/21 0405 04/11/21 0643 04/14/21 0442  WBC 12.1* 11.8* 8.3  NEUTROABS 9.3*  --  5.7  HGB 14.1 13.8 13.1  HCT 43.1 42.2 40.0  MCV 82.7 83.2 82.5  PLT 242 185 223   Basic Metabolic Panel: Recent Labs  Lab 04/10/21 0405 04/11/21 0643 04/14/21 0442  NA 140 139 134*  K 4.4 4.6 4.6  CL 105 105 100  CO2 27 27 26   GLUCOSE 202* 135* 98  BUN 13 15 12   CREATININE 1.05 1.01 0.81  CALCIUM 9.3 8.8* 8.6*  MG  --   --  2.2   GFR: Estimated Creatinine Clearance: 119.8 mL/min (by C-G formula based on SCr of 0.81 mg/dL). Liver Function Tests: Recent Labs  Lab 04/10/21 0405 04/11/21 0643 04/14/21 0442  AST 18 36 19  ALT 15 46* 25  ALKPHOS 90 94 131*  BILITOT 1.3* 3.2* 2.6*  PROT 7.7 7.6 7.5  ALBUMIN 4.2 3.6 3.3*   Recent Labs  Lab 04/10/21 0405  LIPASE 54*   No results for input(s): AMMONIA in the last 168 hours. Coagulation Profile: Recent Labs  Lab 04/11/21 0643  INR 1.1   Cardiac Enzymes: No results for input(s): CKTOTAL, CKMB, CKMBINDEX, TROPONINI in the last 168 hours. BNP (last 3 results) No results for input(s): PROBNP in the last 8760 hours. HbA1C: No results for input(s): HGBA1C in the last 72 hours. CBG: Recent Labs  Lab 04/13/21 0746 04/13/21 1208 04/13/21 1634 04/13/21 2047 04/14/21 0751  GLUCAP 102* 123*  112* 98 100*   Lipid Profile: No results for input(s): CHOL, HDL, LDLCALC, TRIG, CHOLHDL, LDLDIRECT in the last 72 hours. Thyroid Function Tests: No results for input(s): TSH, T4TOTAL, FREET4, T3FREE, THYROIDAB in the last 72 hours. Anemia Panel: No results for input(s): VITAMINB12, FOLATE, FERRITIN, TIBC, IRON, RETICCTPCT in the last 72 hours.    Radiology Studies: I have reviewed all of the imaging during this hospital visit personally     Scheduled Meds:  acetaminophen  1,000 mg Oral Q6H   atorvastatin  80 mg Oral QHS   docusate sodium  100 mg Oral BID   heparin  5,000 Units Subcutaneous Q8H   insulin aspart  0-9 Units Subcutaneous TID WC   metoprolol tartrate  50 mg Oral BID   mupirocin ointment  1 application Nasal BID   polyethylene glycol  17 g Oral Daily   Continuous Infusions:  lactated ringers 75 mL/hr at 04/14/21 0957   piperacillin-tazobactam (ZOSYN)  IV 3.375 g (04/14/21 0940)     LOS: 4 days        Love Chowning Annett Gula, MD

## 2021-04-15 ENCOUNTER — Encounter (HOSPITAL_COMMUNITY)
Admission: EM | Disposition: A | Discharge status: Home / Self Care | Payer: Self-pay | Source: Home / Self Care | Attending: Internal Medicine

## 2021-04-15 ENCOUNTER — Inpatient Hospital Stay (HOSPITAL_COMMUNITY): Payer: Self-pay

## 2021-04-15 ENCOUNTER — Encounter (HOSPITAL_COMMUNITY): Payer: Self-pay | Admitting: Internal Medicine

## 2021-04-15 ENCOUNTER — Inpatient Hospital Stay (HOSPITAL_COMMUNITY): Payer: Self-pay | Admitting: Certified Registered Nurse Anesthetist

## 2021-04-15 HISTORY — PX: CHOLECYSTECTOMY: SHX55

## 2021-04-15 LAB — CBC
HCT: 39.4 % (ref 39.0–52.0)
Hemoglobin: 13 g/dL (ref 13.0–17.0)
MCH: 27.1 pg (ref 26.0–34.0)
MCHC: 33 g/dL (ref 30.0–36.0)
MCV: 82.1 fL (ref 80.0–100.0)
Platelets: 235 10*3/uL (ref 150–400)
RBC: 4.8 MIL/uL (ref 4.22–5.81)
RDW: 14.6 % (ref 11.5–15.5)
WBC: 7.3 10*3/uL (ref 4.0–10.5)
nRBC: 0 % (ref 0.0–0.2)

## 2021-04-15 LAB — GLUCOSE, CAPILLARY
Glucose-Capillary: 118 mg/dL — ABNORMAL HIGH (ref 70–99)
Glucose-Capillary: 142 mg/dL — ABNORMAL HIGH (ref 70–99)
Glucose-Capillary: 123 mg/dL — ABNORMAL HIGH (ref 70–99)
Glucose-Capillary: 97 mg/dL (ref 70–99)

## 2021-04-15 LAB — BASIC METABOLIC PANEL
Anion gap: 9 (ref 5–15)
BUN: 10 mg/dL (ref 6–20)
CO2: 27 mmol/L (ref 22–32)
Calcium: 8.8 mg/dL — ABNORMAL LOW (ref 8.9–10.3)
Chloride: 99 mmol/L (ref 98–111)
Creatinine, Ser: 0.7 mg/dL (ref 0.61–1.24)
GFR, Estimated: >60 mL/min (ref >60)
Glucose, Bld: 101 mg/dL — ABNORMAL HIGH (ref 70–99)
Potassium: 3.9 mmol/L (ref 3.5–5.1)
Sodium: 135 mmol/L (ref 135–145)

## 2021-04-15 SURGERY — LAPAROSCOPIC CHOLECYSTECTOMY WITH INTRAOPERATIVE CHOLANGIOGRAM
Anesthesia: General | Site: Abdomen

## 2021-04-15 MED ORDER — HEPARIN SODIUM (PORCINE) 5000 UNIT/ML IJ SOLN
5000.0000 [IU] | Freq: Three times a day (TID) | INTRAMUSCULAR | Status: DC
  Administered 2021-04-16: 5000 [IU] via SUBCUTANEOUS
  Filled 2021-04-15: qty 1

## 2021-04-15 MED ORDER — SUGAMMADEX SODIUM 200 MG/2ML IV SOLN
INTRAVENOUS | Status: DC | PRN
  Administered 2021-04-15: 200 mg via INTRAVENOUS

## 2021-04-15 MED ORDER — LACTATED RINGERS IV SOLN
INTRAVENOUS | Status: AC | PRN
  Administered 2021-04-15: 1000 mL

## 2021-04-15 MED ORDER — MIDAZOLAM HCL 5 MG/5ML IJ SOLN
INTRAMUSCULAR | Status: DC | PRN
  Administered 2021-04-15: 2 mg via INTRAVENOUS

## 2021-04-15 MED ORDER — FENTANYL CITRATE (PF) 100 MCG/2ML IJ SOLN
INTRAMUSCULAR | Status: AC
  Filled 2021-04-15: qty 2

## 2021-04-15 MED ORDER — ONDANSETRON HCL 4 MG/2ML IJ SOLN
INTRAMUSCULAR | Status: AC
  Filled 2021-04-15: qty 2

## 2021-04-15 MED ORDER — HEMOSTATIC AGENTS (NO CHARGE) OPTIME
TOPICAL | Status: DC | PRN
  Administered 2021-04-15: 1 via TOPICAL

## 2021-04-15 MED ORDER — BUPIVACAINE-EPINEPHRINE 0.25% -1:200000 IJ SOLN
INTRAMUSCULAR | Status: DC | PRN
  Administered 2021-04-15: 13 mL

## 2021-04-15 MED ORDER — ACETAMINOPHEN 10 MG/ML IV SOLN
INTRAVENOUS | Status: AC
  Filled 2021-04-15: qty 100

## 2021-04-15 MED ORDER — ROCURONIUM BROMIDE 100 MG/10ML IV SOLN
INTRAVENOUS | Status: DC | PRN
  Administered 2021-04-15: 10 mg via INTRAVENOUS
  Administered 2021-04-15: 50 mg via INTRAVENOUS

## 2021-04-15 MED ORDER — OXYCODONE HCL 5 MG PO TABS
ORAL_TABLET | ORAL | Status: AC
  Filled 2021-04-15: qty 1

## 2021-04-15 MED ORDER — FENTANYL CITRATE (PF) 100 MCG/2ML IJ SOLN
INTRAMUSCULAR | Status: DC | PRN
  Administered 2021-04-15: 100 ug via INTRAVENOUS
  Administered 2021-04-15 (×2): 50 ug via INTRAVENOUS

## 2021-04-15 MED ORDER — ONDANSETRON HCL 4 MG/2ML IJ SOLN
INTRAMUSCULAR | Status: DC | PRN
  Administered 2021-04-15: 4 mg via INTRAVENOUS

## 2021-04-15 MED ORDER — OXYCODONE HCL 5 MG PO TABS
5.0000 mg | ORAL_TABLET | Freq: Once | ORAL | Status: AC
  Administered 2021-04-15: 5 mg via ORAL

## 2021-04-15 MED ORDER — BUPIVACAINE-EPINEPHRINE (PF) 0.25% -1:200000 IJ SOLN
INTRAMUSCULAR | Status: AC
  Filled 2021-04-15: qty 30

## 2021-04-15 MED ORDER — KETOROLAC TROMETHAMINE 30 MG/ML IJ SOLN
INTRAMUSCULAR | Status: AC
  Filled 2021-04-15: qty 1

## 2021-04-15 MED ORDER — FENTANYL CITRATE PF 50 MCG/ML IJ SOSY
PREFILLED_SYRINGE | INTRAMUSCULAR | Status: AC
  Filled 2021-04-15: qty 3

## 2021-04-15 MED ORDER — FENTANYL CITRATE PF 50 MCG/ML IJ SOSY
25.0000 ug | PREFILLED_SYRINGE | INTRAMUSCULAR | Status: DC | PRN
  Administered 2021-04-15 (×3): 50 ug via INTRAVENOUS

## 2021-04-15 MED ORDER — MIDAZOLAM HCL 2 MG/2ML IJ SOLN
INTRAMUSCULAR | Status: AC
  Filled 2021-04-15: qty 2

## 2021-04-15 MED ORDER — KETOROLAC TROMETHAMINE 30 MG/ML IJ SOLN
30.0000 mg | Freq: Once | INTRAMUSCULAR | Status: AC
  Administered 2021-04-15: 30 mg via INTRAVENOUS

## 2021-04-15 MED ORDER — LIDOCAINE HCL (CARDIAC) PF 100 MG/5ML IV SOSY
PREFILLED_SYRINGE | INTRAVENOUS | Status: DC | PRN
Start: 2021-04-15 — End: 2021-04-15
  Administered 2021-04-15: 80 mg via INTRAVENOUS

## 2021-04-15 MED ORDER — PROMETHAZINE HCL 25 MG/ML IJ SOLN: 6.2500 mg | INTRAMUSCULAR | Status: DC | PRN

## 2021-04-15 MED ORDER — CELECOXIB 200 MG PO CAPS
200.0000 mg | ORAL_CAPSULE | Freq: Once | ORAL | Status: AC
  Administered 2021-04-15: 200 mg via ORAL
  Filled 2021-04-15: qty 1

## 2021-04-15 MED ORDER — HYDROMORPHONE HCL 1 MG/ML IJ SOLN
1.0000 mg | Freq: Once | INTRAMUSCULAR | Status: AC
  Administered 2021-04-15: 1 mg via INTRAVENOUS

## 2021-04-15 MED ORDER — PROPOFOL 10 MG/ML IV BOLUS
INTRAVENOUS | Status: DC | PRN
  Administered 2021-04-15: 150 mg via INTRAVENOUS

## 2021-04-15 MED ORDER — HYDROMORPHONE HCL 1 MG/ML IJ SOLN
INTRAMUSCULAR | Status: AC
  Filled 2021-04-15: qty 1

## 2021-04-15 MED ORDER — LACTATED RINGERS IV SOLN: INTRAVENOUS | Status: DC

## 2021-04-15 MED ORDER — ACETAMINOPHEN 500 MG PO TABS
1000.0000 mg | ORAL_TABLET | Freq: Once | ORAL | Status: AC
  Administered 2021-04-15: 1000 mg via ORAL
  Filled 2021-04-15: qty 2

## 2021-04-15 MED ORDER — ACETAMINOPHEN 10 MG/ML IV SOLN
1000.0000 mg | Freq: Once | INTRAVENOUS | Status: AC
  Administered 2021-04-15: 1000 mg via INTRAVENOUS

## 2021-04-15 MED ORDER — IOHEXOL 300 MG/ML  SOLN
INTRAMUSCULAR | Status: DC | PRN
  Administered 2021-04-15: 10 mL

## 2021-04-15 MED ORDER — DEXAMETHASONE SODIUM PHOSPHATE 10 MG/ML IJ SOLN
INTRAMUSCULAR | Status: DC | PRN
  Administered 2021-04-15: 10 mg via INTRAVENOUS

## 2021-04-15 MED ORDER — AMISULPRIDE (ANTIEMETIC) 5 MG/2ML IV SOLN: 10.0000 mg | Freq: Once | INTRAVENOUS | Status: DC | PRN

## 2021-04-15 SURGICAL SUPPLY — 37 items
APPLIER CLIP 5 13 M/L LIGAMAX5 (MISCELLANEOUS) ×4
BAG COUNTER SPONGE SURGICOUNT (BAG) IMPLANT
CABLE HIGH FREQUENCY MONO STRZ (ELECTRODE) ×2 IMPLANT
CHLORAPREP W/TINT 26 (MISCELLANEOUS) ×2 IMPLANT
CLIP APPLIE 5 13 M/L LIGAMAX5 (MISCELLANEOUS) ×2 IMPLANT
COVER MAYO STAND STRL (DRAPES) IMPLANT
COVER SURGICAL LIGHT HANDLE (MISCELLANEOUS) ×4 IMPLANT
DERMABOND ADVANCED (GAUZE/BANDAGES/DRESSINGS) ×1
DERMABOND ADVANCED .7 DNX12 (GAUZE/BANDAGES/DRESSINGS) ×1 IMPLANT
DEVICE TROCAR PUNCTURE CLOSURE (ENDOMECHANICALS) IMPLANT
DRAPE C-ARM 42X120 X-RAY (DRAPES) IMPLANT
DRAPE LAPAROSCOPIC ABDOMINAL (DRAPES) ×2 IMPLANT
ELECT REM PT RETURN 15FT ADLT (MISCELLANEOUS) ×2 IMPLANT
GLOVE SURG ENC MOIS LTX SZ6.5 (GLOVE) ×2 IMPLANT
GLOVE SURG UNDER POLY LF SZ7 (GLOVE) ×2 IMPLANT
GOWN STRL REUS W/TWL XL LVL3 (GOWN DISPOSABLE) ×6 IMPLANT
IRRIG SUCT STRYKERFLOW 2 WTIP (MISCELLANEOUS) ×2
IRRIGATION SUCT STRKRFLW 2 WTP (MISCELLANEOUS) ×1 IMPLANT
IV CATH 14GX2 1/4 (CATHETERS) IMPLANT
KIT BASIN OR (CUSTOM PROCEDURE TRAY) ×2 IMPLANT
KIT TURNOVER KIT A (KITS) ×2 IMPLANT
PENCIL SMOKE EVACUATOR (MISCELLANEOUS) IMPLANT
POUCH SPECIMEN RETRIEVAL 10MM (ENDOMECHANICALS) ×2 IMPLANT
SCISSORS LAP 5X35 DISP (ENDOMECHANICALS) ×2 IMPLANT
SET CHOLANGIOGRAPH MIX (MISCELLANEOUS) IMPLANT
SET TUBE SMOKE EVAC HIGH FLOW (TUBING) ×2 IMPLANT
SLEEVE XCEL OPT CAN 5 100 (ENDOMECHANICALS) ×6 IMPLANT
SUT VIC AB 2-0 SH 27 (SUTURE)
SUT VIC AB 2-0 SH 27X BRD (SUTURE) IMPLANT
SUT VIC AB 4-0 PS2 18 (SUTURE) ×2 IMPLANT
SUT VICRYL 0 UR6 27IN ABS (SUTURE) IMPLANT
TOWEL OR 17X26 10 PK STRL BLUE (TOWEL DISPOSABLE) ×2 IMPLANT
TOWEL OR NON WOVEN STRL DISP B (DISPOSABLE) ×2 IMPLANT
TRAY LAPAROSCOPIC (CUSTOM PROCEDURE TRAY) ×2 IMPLANT
TROCAR BLADELESS OPT 5 100 (ENDOMECHANICALS) ×2 IMPLANT
TROCAR XCEL BLUNT TIP 100MML (ENDOMECHANICALS) IMPLANT
TROCAR XCEL NON-BLD 11X100MML (ENDOMECHANICALS) IMPLANT

## 2021-04-15 NOTE — Anesthesia Preprocedure Evaluation (Signed)
Anesthesia Evaluation    Reviewed: Allergy & Precautions, Patient's Chart, lab work & pertinent test results, Unable to perform ROS - Chart review only  History of Anesthesia Complications Negative for: history of anesthetic complications  Airway Mallampati: I  TM Distance: >3 FB Neck ROM: Full    Dental  (+) Edentulous Upper, Edentulous Lower, Dental Advisory Given   Pulmonary neg pulmonary ROS, former smoker,    Pulmonary exam normal        Cardiovascular hypertension, Pt. on home beta blockers and Pt. on medications + angina + CAD and + CABG   Rhythm:Regular Rate:Tachycardia  Echo 11/12/2020 1. Left ventricular ejection fraction, by estimation, is 55 to 60%. The left ventricle has normal function. The left ventricle has no regional wall motion abnormalities. Left ventricular diastolic parameters were normal.  2. Right ventricular systolic function is normal. The right ventricular size is normal.  3. The mitral valve is grossly normal. No evidence of mitral valve regurgitation.  4. The aortic valve is tricuspid. Aortic valve regurgitation is not visualized.   Intraop TEE 11/14/2020 - Left Ventricle: has mildly reduced systolic function. The cavity size was normal. The wall motion is normal.  - Right Ventricle: The right ventricle appears unchanged from pre-bypass.  - Aorta: The aorta appears unchanged from pre-bypass.  - Left Atrial Appendage: The left atrial appendage appears unchanged from pre-bypass.  - Aortic Valve: The aortic valve appears unchanged from pre-bypass.  - Mitral Valve: The mitral valve appears unchanged from pre-bypass.  - Tricuspid Valve: The tricuspid valve appears unchanged from pre-bypass.  - Pulmonic Valve: The pulmonic valve appears unchanged from pre-bypass.  - Interatrial Septum: The interatrial septum appears unchanged from pre-bypass.  - Pericardium: The pericardium appears unchanged from pre-bypass.   - Comments: S/P CABG X 4. No new or worsening wall motion or valvular abnormalities. Mildly reduced systolic function with global hypokinesis.   Mild central MR unchanged.    Neuro/Psych negative neurological ROS     GI/Hepatic negative GI ROS, Neg liver ROS,   Endo/Other  diabetes  Renal/GU negative Renal ROS     Musculoskeletal negative musculoskeletal ROS (+)   Abdominal   Peds  Hematology negative hematology ROS (+)   Anesthesia Other Findings   Reproductive/Obstetrics                             Anesthesia Physical  Anesthesia Plan  ASA: 3  Anesthesia Plan: General   Post-op Pain Management:    Induction: Intravenous  PONV Risk Score and Plan: 4 or greater and Ondansetron, Dexamethasone, Midazolam, Treatment may vary due to age or medical condition and Diphenhydramine  Airway Management Planned: Oral ETT  Additional Equipment: None  Intra-op Plan:   Post-operative Plan: Extubation in OR  Informed Consent: I have reviewed the patients History and Physical, chart, labs and discussed the procedure including the risks, benefits and alternatives for the proposed anesthesia with the patient or authorized representative who has indicated his/her understanding and acceptance.     Dental advisory given  Plan Discussed with: CRNA  Anesthesia Plan Comments:        Anesthesia Quick Evaluation

## 2021-04-15 NOTE — Plan of Care (Signed)
  Problem: Education: Goal: Knowledge of General Education information will improve Description: Including pain rating scale, medication(s)/side effects and non-pharmacologic comfort measures Outcome: Progressing   Problem: Health Behavior/Discharge Planning: Goal: Ability to manage health-related needs will improve Outcome: Progressing   Problem: Clinical Measurements: Goal: Will remain free from infection Outcome: Progressing   Problem: Nutrition: Goal: Adequate nutrition will be maintained Outcome: Progressing   Problem: Elimination: Goal: Will not experience complications related to bowel motility Outcome: Progressing   Problem: Pain Managment: Goal: General experience of comfort will improve Outcome: Progressing   Problem: Safety: Goal: Ability to remain free from injury will improve Outcome: Progressing

## 2021-04-15 NOTE — Anesthesia Procedure Notes (Signed)
Procedure Name: Intubation Date/Time: 04/15/2021 10:28 AM Performed by: [place] Parker Wherley C, CRNA Pre-anesthesia Checklist: Patient identified, Emergency Drugs available, Suction available and Patient being monitored Patient Re-evaluated:Patient Re-evaluated prior to induction Oxygen Delivery Method: Circle system utilized Preoxygenation: Pre-oxygenation with 100% oxygen Induction Type: IV induction Ventilation: Mask ventilation without difficulty Laryngoscope Size: Mac and 4 Grade View: Grade I Tube type: Oral Tube size: 7.5 mm Number of attempts: 1 Airway Equipment and Method: Stylet and Oral airway Placement Confirmation: ETT inserted through vocal cords under direct vision, positive ETCO2 and breath sounds checked- equal and bilateral Secured at: 21 cm Tube secured with: Tape Dental Injury: Teeth and Oropharynx as per pre-operative assessment

## 2021-04-15 NOTE — Progress Notes (Signed)
Day of Surgery   Subjective/Chief Complaint: Pain when I eat Patient in bed.  He is doing okay.  Pain is about the same as yesterday.  Pt's surgery rescheduled from yesterday due to power failure in the OR   Objective: Vital signs in last 24 hours: Temp:  [97.7 F (36.5 C)-99.6 F (37.6 C)] 98.3 F (36.8 C) (10/04 0820) Pulse Rate:  [85-115] 115 (10/04 0820) Resp:  [16-20] 18 (10/04 0820) BP: (130-167)/(86-97) 148/96 (10/04 0820) SpO2:  [96 %-99 %] 98 % (10/04 0820) Weight:  [93 kg] 93 kg (10/04 0843) Last BM Date: 04/08/21  Intake/Output from previous day: 10/03 0701 - 10/04 0700 In: 2526.7 [I.V.:2000; IV Piggyback:526.7] Out: -  Intake/Output this shift: No intake/output data recorded.  General appearance: alert and cooperative Resp: clear to auscultation bilaterally GI: Tender right upper quadrant.  Mild.  Lab Results:  Recent Labs    04/14/21 0442 04/15/21 0501  WBC 8.3 7.3  HGB 13.1 13.0  HCT 40.0 39.4  PLT 223 235    BMET Recent Labs    04/14/21 0442 04/15/21 0501  NA 134* 135  K 4.6 3.9  CL 100 99  CO2 26 27  GLUCOSE 98 101*  BUN 12 10  CREATININE 0.81 0.70  CALCIUM 8.6* 8.8*    PT/INR No results for input(s): LABPROT, INR in the last 72 hours.  ABG No results for input(s): PHART, HCO3 in the last 72 hours.  Invalid input(s): PCO2, PO2  Studies/Results: No results found.  Anti-infectives: Anti-infectives (From admission, onward)    Start     Dose/Rate Route Frequency Ordered Stop   04/14/21 0600  ceFAZolin (ANCEF) IVPB 2g/100 mL premix  Status:  Discontinued        2 g 200 mL/hr over 30 Minutes Intravenous On call to O.R. 04/13/21 1617 04/14/21 1223   04/10/21 1700  [MAR Hold]  piperacillin-tazobactam (ZOSYN) IVPB 3.375 g        (MAR Hold since Tue 04/15/2021 at 0818.Hold Reason: Transfer to a Procedural area)   3.375 g 12.5 mL/hr over 240 Minutes Intravenous Every 8 hours 04/10/21 1631     04/10/21 0730  piperacillin-tazobactam  (ZOSYN) IVPB 3.375 g        3.375 g 100 mL/hr over 30 Minutes Intravenous  Once 04/10/21 0719 04/10/21 0909       Assessment/Plan:  Cholecystitis             HIDA scan positive with non-vis of gallbladder             Persistent symptoms of RUQ pain             Empiric abx's             Holding Plavix and ASA CAD             Cleared by cardiology   plan OR today (5 days off of Plavix).  The anatomy & physiology of hepatobiliary & pancreatic function was discussed.  The pathophysiology of gallbladder dysfunction was discussed.  Natural history risks without surgery was discussed.   I feel the risks of no intervention will lead to serious problems that outweigh the operative risks; therefore, I recommended cholecystectomy to remove the pathology.  I explained laparoscopic techniques with possible need for an open approach.  Probable cholangiogram to evaluate the bilary tract was explained as well.    Risks such as bleeding, infection, abscess, leak, injury to other organs, need for further treatment, heart attack, death, and other  risks were discussed.  I noted a good likelihood this will help address the problem.  Possibility that this will not correct all abdominal symptoms was explained.  Goals of post-operative recovery were discussed as well.  We will work to minimize complications.  An educational handout further explaining the pathology and treatment options was given as well.  Questions were answered.  The patient expresses understanding & wishes to proceed with surgery.    LOS: 5 days    Vanita Panda MD  04/15/2021

## 2021-04-15 NOTE — Op Note (Signed)
04/10/2021 - 04/15/2021  12:06 PM  PATIENT:  Gerald Andrade  50 y.o. male  Patient Care Team: Pcp, No as PCP - General  PRE-OPERATIVE DIAGNOSIS:  gallstones  POST-OPERATIVE DIAGNOSIS:  chronic cholecystitis  PROCEDURE:  LAPAROSCOPIC CHOLECYSTECTOMY WITH INTRAOPERATIVE CHOLANGIOGRAM    Surgeon(s): Romie Levee, MD  ASSISTANT: Dr Fredricka Bonine    ANESTHESIA:   local and general  EBL: 62ml  Total I/O In: 100 [IV Piggyback:100] Out: -   DRAINS: none   SPECIMEN:  Source of Specimen:  gallbladder  DISPOSITION OF SPECIMEN:  PATHOLOGY  COUNTS:  YES  PLAN OF CARE:  pt already admitted  PATIENT DISPOSITION:  PACU - hemodynamically stable.  INDICATION: 50 y.o. M with symptomatic cholelithiasis.    The anatomy & physiology of hepatobiliary & pancreatic function was discussed.  The pathophysiology of gallbladder dysfunction was discussed.  Natural history risks without surgery was discussed.   I feel the risks of no intervention will lead to serious problems that outweigh the operative risks; therefore, I recommended cholecystectomy to remove the pathology.  I explained laparoscopic techniques with possible need for an open approach.  Probable cholangiogram to evaluate the bilary tract was explained as well.    Risks such as bleeding, infection, abscess, leak, injury to other organs, need for further treatment, heart attack, death, and other risks were discussed.  I noted a good likelihood this will help address the problem.  Possibility that this will not correct all abdominal symptoms was explained.  Goals of post-operative recovery were discussed as well.    OR FINDINGS: chronic cholecystitis   DESCRIPTION:   The patient was identified & brought into the operating room. The patient was positioned supine with arms tucked. SCDs were active during the entire case. The patient underwent general anesthesia without any difficulty.  The abdomen was prepped and draped in a sterile  fashion. A Surgical Timeout was performed and confirmed our plan.  We positioned the patient in reverse [place] & right side up.  I placed a Hassan laparoscopic port through the umbilicus using open entry technique.  Entry was clean. There were no adhesions to the anterior abdominal wall supraumbilically.  We induced carbon dioxide insufflation. Camera inspection revealed no injury.    I proceeded to continue with laparoscopic technique. I placed a 5 mm port in mid subcostal region, another 71mm port in the right flank near the anterior axillary line, and a 46mm port in the left subxiphoid region obliquely within the falciform ligament.  I turned attention to the right upper quadrant.  The gallbladder fundus was elevated cephalad. I used cautery and blunt dissection to free the peritoneal coverings between the gallbladder and the liver on the posteriolateral and anteriomedial walls.  I used careful blunt and cautery dissection with a maryland dissector to help get a good critical view of the cystic artery and cystic duct. I did further dissection to free a few centimeters of the  gallbladder off the liver bed to get a good critical view of the infundibulum and cystic duct. I mobilized the cystic artery.  I skeletonized the cystic duct.  After getting a good 360 view, I decided to perform a cholangiogram.  I placed a clip on the infundibulum.   I did a partial cystic duct-otomy and ensured patency. I placed a 5 F cholangiocatheter through a puncture site at the right subcostal ridge of the abdominal wall and directed it into the cystic duct.  This was secured with a clip. We ran a  cholangiogram with dilute radio-opaque contrast and continuous fluoroscopy.  Contrast flowed from a side branch consistent with cystic duct cannulization. Contrast flowed up the common hepatic duct into the right and left intrahepatic chains out to secondary radicals. Contrast flowed down the common bile duct easily across the  normal ampulla into the duodenum.  This was consistent with a normal cholangiogram without filling defects.  I removed the cholangiocatheter.  I placed clips on the cystic duct x3.  I completed cystic duct transection.   I placed clips on the cystic artery x3 with 2 proximally.  I ligated the cystic artery using scissors. I began to free the gallbladder from its remaining attachments to the liver.  There was unexpected aterial bleeding from below the clips.  After adding an additional 5 mm port and calling my partner to help with retraction.  I was able to find and clip the bleeding.  I removed the rest of the gallbladder from the liver bed using electrocautery.  I ensured hemostasis on the gallbladder fossa of the liver and elsewhere. I inspected the rest of the abdomen & detected no injury nor bleeding elsewhere.  I irrigated the RUQ with normal saline.  I removed the gallbladder through the umbilical port site.  I closed the umbilical fascia using a 0 Vicryl stitch.   I closed the skin using 4-0 vicryl stitch.  Sterile dressings were applied. The patient was extubated & arrived in the PACU in stable condition.  I had discussed postoperative care with the patient in the holding area.  I will discuss  operative findings and postoperative goals / instructions with the patient's family.  Instructions are written in the chart as well.

## 2021-04-15 NOTE — Progress Notes (Signed)
PROGRESS NOTE    Gerald Andrade  OHY:[id number] DOB: March 15, 1971 DOA: 04/10/2021 PCP: Pcp, No    Brief Narrative:  [name] was admitted to the hospital with the working diagnosis of acute cholecystitis. Now sp cholecystectomy.    50 year old male past medical history for hypertension, dyslipidemia, coronary artery disease and type 2 diabetes mellitus who presented abdominal pain.  Reported epigastric infraumbilical pain lasting for about 24 hours, stool in nature, associated with nausea but no vomiting.  Because of worsening pain he presented to the emergency department.  On his initial physical examination blood pressure 154/89, heart rate 82, respiratory rate 16, oxygen saturation 92%, his lungs are clear to auscultation bilaterally, heart S1-S2, present, rhythmic, abdomen was nondistended, no organomegaly, no lower extremity edema.   Sodium 140, potassium 4.4, chloride 105, bicarb 27, glucose 202, BUN 13, creatinine 1.0, lipase 54, AST 18, ALT 15, total bili was 1.3, white count 12.1, hemoglobin 14.1, hematocrit 43.1, platelets 242. SARS COVID-19 negative.   Urinalysis Pacific gravity 1.015.   Chest radiograph no infiltrates.   EKG 58 bpm, rightward axis, right bundle branch block, sinus rhythm, Q-wave lead II-III-aVF, no significant ST segment or T wave changes.   CT of the abdomen pelvis with gallbladder wall thickening, pericholecystic edema, and chronic small left pleural effusion.  HIDA scan positive for cholecystitis   Assessment & Plan:   Principal Problem:   Acute cholecystitis Active Problems:   Coronary artery disease    Type II diabetes mellitus (HCC)   History of atrial fibrillation   Acute cholecystitis.  Patient sp cholecystectomy with perioperative cholangiogram.   Continue pain control with hydromorphone IV and antibiotic therapy with Zosyn Continue with as needed antiemetics and scheduled antiacids.  Continue gentle IV fluids for now.    2.  Atrial fibrillation (paroxysmal) Continue rate control with metoprolol.  Plan to resume anticoagulation when [facility] per surgical team.    3. T2DM/ dyslipidemia.  His glucose has been stable, fasting this am 101, capillary 118 and 142 mg/dl  On insulin sliding scale.  On statin     4, CAD.  Patient is chest pain free, plan to resume antiplatelet therapy when [facility] per surgical team,    5. Anxiety. on diazepam qhs.     Status is: Inpatient  Remains inpatient appropriate because:Inpatient level of care appropriate due to severity of illness  Dispo: The patient is from: Home              Anticipated d/c is to: Home              Patient currently is not medically stable to d/c.   Difficult to place patient No   DVT prophylaxis: Heparin Code Status:     full Family Communication:  No family at the bedside      Consultants:  Surgery    Procedures:  Cholecystectomy and intra operative cholangiogram.     Subjective: Patient is pos op, with mild sedation post surgery, no nausea or vomiting, no chest pain. Positive abdominal pain.   Objective: Vitals:   04/15/21 1330 04/15/21 1345 04/15/21 1354 04/15/21 1411  BP: (!) 151/88 (!) 153/76 (!) 145/71 (!) 168/101  Pulse: 72 71 69 78  Resp: 17 12 (!) 9 18  Temp:  97.6 F (36.4 C)  97.7 F (36.5 C)  TempSrc:    Oral  SpO2: 100% 99% 99% 100%  Weight:      Height:        Intake/Output Summary (Last  24 hours) at 04/15/2021 1446 Last data filed at 04/15/2021 1336 Gross per 24 hour  Intake 3986.65 ml  Output 100 ml  Net 3886.65 ml   Filed Weights   04/10/21 0339 04/14/21 0950 04/15/21 0843  Weight: 93 kg 93 kg 93 kg    Examination:   General: Not in pain or dyspnea, deconditioned  Neurology: Awake and alert, non focal  E ENT: no pallor, no icterus, oral mucosa moist Cardiovascular: No JVD. S1-S2 present, rhythmic, no gallops, rubs, or murmurs. No lower extremity edema. Pulmonary: positive breath sounds bilaterally,  adequate air movement, no wheezing, rhonchi or rales. Gastrointestinal. Abdomen soft, mild tender to palpation,.  Skin. No rashes Musculoskeletal: no joint deformities     Data Reviewed: I have personally reviewed following labs and imaging studies  CBC: Recent Labs  Lab 04/10/21 0405 04/11/21 0643 04/14/21 0442 04/15/21 0501  WBC 12.1* 11.8* 8.3 7.3  NEUTROABS 9.3*  --  5.7  --   HGB 14.1 13.8 13.1 13.0  HCT 43.1 42.2 40.0 39.4  MCV 82.7 83.2 82.5 82.1  PLT 242 185 223 235   Basic Metabolic Panel: Recent Labs  Lab 04/10/21 0405 04/11/21 0643 04/14/21 0442 04/15/21 0501  NA 140 139 134* 135  K 4.4 4.6 4.6 3.9  CL 105 105 100 99  CO2 27 27 26 27   GLUCOSE 202* 135* 98 101*  BUN 13 15 12 10   CREATININE 1.05 1.01 0.81 0.70  CALCIUM 9.3 8.8* 8.6* 8.8*  MG  --   --  2.2  --    GFR: Estimated Creatinine Clearance: 121.3 mL/min (by C-G formula based on SCr of 0.7 mg/dL). Liver Function Tests: Recent Labs  Lab 04/10/21 0405 04/11/21 0643 04/14/21 0442  AST 18 36 19  ALT 15 46* 25  ALKPHOS 90 94 131*  BILITOT 1.3* 3.2* 2.6*  PROT 7.7 7.6 7.5  ALBUMIN 4.2 3.6 3.3*   Recent Labs  Lab 04/10/21 0405  LIPASE 54*   No results for input(s): AMMONIA in the last 168 hours. Coagulation Profile: Recent Labs  Lab 04/11/21 0643  INR 1.1   Cardiac Enzymes: No results for input(s): CKTOTAL, CKMB, CKMBINDEX, TROPONINI in the last 168 hours. BNP (last 3 results) No results for input(s): PROBNP in the last 8760 hours. HbA1C: No results for input(s): HGBA1C in the last 72 hours. CBG: Recent Labs  Lab 04/14/21 0751 04/14/21 1639 04/14/21 2004 04/15/21 0752 04/15/21 1222  GLUCAP 100* 110* 97 118* 142*   Lipid Profile: No results for input(s): CHOL, HDL, LDLCALC, TRIG, CHOLHDL, LDLDIRECT in the last 72 hours. Thyroid Function Tests: No results for input(s): TSH, T4TOTAL, FREET4, T3FREE, THYROIDAB in the last 72 hours. Anemia Panel: No results for input(s):  VITAMINB12, FOLATE, FERRITIN, TIBC, IRON, RETICCTPCT in the last 72 hours.    Radiology Studies: I have reviewed all of the imaging during this hospital visit personally     Scheduled Meds:  acetaminophen  1,000 mg Oral Q6H   atorvastatin  80 mg Oral QHS   docusate sodium  100 mg Oral BID   fentaNYL       [START ON 04/16/2021] heparin  5,000 Units Subcutaneous Q8H   HYDROmorphone       insulin aspart  0-9 Units Subcutaneous TID WC   ketorolac       metoprolol tartrate  50 mg Oral BID   mupirocin ointment  1 application Nasal BID   oxyCODONE       polyethylene glycol  17 g  Oral Daily   Continuous Infusions:  acetaminophen     famotidine (PEPCID) IV Stopped (04/14/21 1838)   lactated ringers 50 mL/hr at 04/15/21 1013   piperacillin-tazobactam (ZOSYN)  IV Stopped (04/15/21 1021)     LOS: 5 days        Kiyara Bouffard Annett Gula, MD

## 2021-04-15 NOTE — Transfer of Care (Signed)
Immediate Anesthesia Transfer of Care Note  Patient: Gerald Andrade  Procedure(s) Performed: LAPAROSCOPIC CHOLECYSTECTOMY WITH INTRAOPERATIVE CHOLANGIOGRAM (Abdomen)  Patient Location: PACU  Anesthesia Type:General  Level of Consciousness: awake, alert  and oriented  Airway & Oxygen Therapy: Patient Spontanous Breathing and Patient connected to face mask oxygen  Post-op Assessment: Report given to RN and Post -op Vital signs reviewed and stable  Post vital signs: Reviewed  Last Vitals:  Vitals Value Taken Time  BP 176/98 04/15/21 1221  Temp    Pulse    Resp 15 04/15/21 1223  SpO2    Vitals shown include unvalidated device data.  Last Pain:  Vitals:   04/15/21 0842  TempSrc:   PainSc: 5       Patients Stated Pain Goal: 4 (04/15/21 8469)  Complications: No notable events documented.

## 2021-04-16 ENCOUNTER — Encounter (HOSPITAL_COMMUNITY): Payer: Self-pay | Admitting: General Surgery

## 2021-04-16 LAB — CBC
HCT: 38.5 % — ABNORMAL LOW (ref 39.0–52.0)
Hemoglobin: 12.6 g/dL — ABNORMAL LOW (ref 13.0–17.0)
MCH: 27.3 pg (ref 26.0–34.0)
MCHC: 32.7 g/dL (ref 30.0–36.0)
MCV: 83.3 fL (ref 80.0–100.0)
Platelets: 235 10*3/uL (ref 150–400)
RBC: 4.62 MIL/uL (ref 4.22–5.81)
RDW: 15 % (ref 11.5–15.5)
WBC: 13.3 10*3/uL — ABNORMAL HIGH (ref 4.0–10.5)
nRBC: 0 % (ref 0.0–0.2)

## 2021-04-16 LAB — GLUCOSE, CAPILLARY
Glucose-Capillary: 106 mg/dL — ABNORMAL HIGH (ref 70–99)
Glucose-Capillary: 197 mg/dL — ABNORMAL HIGH (ref 70–99)
Glucose-Capillary: 119 mg/dL — ABNORMAL HIGH (ref 70–99)

## 2021-04-16 LAB — BASIC METABOLIC PANEL
Anion gap: 9 (ref 5–15)
BUN: 11 mg/dL (ref 6–20)
CO2: 25 mmol/L (ref 22–32)
Calcium: 8.4 mg/dL — ABNORMAL LOW (ref 8.9–10.3)
Chloride: 98 mmol/L (ref 98–111)
Creatinine, Ser: 0.98 mg/dL (ref 0.61–1.24)
GFR, Estimated: >60 mL/min (ref >60)
Glucose, Bld: 143 mg/dL — ABNORMAL HIGH (ref 70–99)
Potassium: 4.3 mmol/L (ref 3.5–5.1)
Sodium: 132 mmol/L — ABNORMAL LOW (ref 135–145)

## 2021-04-16 LAB — SURGICAL PATHOLOGY

## 2021-04-16 MED ORDER — OXYCODONE HCL 5 MG PO TABS
5.0000 mg | ORAL_TABLET | ORAL | Status: DC | PRN
  Administered 2021-04-16: 5 mg via ORAL
  Administered 2021-04-16: 10 mg via ORAL
  Filled 2021-04-16 (×2): qty 2

## 2021-04-16 MED ORDER — METHOCARBAMOL 500 MG PO TABS: 1000.0000 mg | ORAL_TABLET | Freq: Four times a day (QID) | ORAL | 0 refills | Status: AC | PRN

## 2021-04-16 MED ORDER — OXYCODONE HCL 5 MG PO TABS: 5.0000 mg | ORAL_TABLET | ORAL | 0 refills | Status: AC | PRN

## 2021-04-16 MED ORDER — IBUPROFEN 200 MG PO TABS: 600.0000 mg | ORAL_TABLET | Freq: Four times a day (QID) | ORAL | Status: DC

## 2021-04-16 MED ORDER — ACETAMINOPHEN 500 MG PO TABS: 1000.0000 mg | ORAL_TABLET | Freq: Four times a day (QID) | ORAL | Status: AC

## 2021-04-16 MED ORDER — METHOCARBAMOL 500 MG PO TABS
1000.0000 mg | ORAL_TABLET | Freq: Four times a day (QID) | ORAL | Status: DC
  Administered 2021-04-16 (×2): 1000 mg via ORAL
  Filled 2021-04-16 (×2): qty 2

## 2021-04-16 MED ORDER — DOCUSATE SODIUM 100 MG PO CAPS: 100.0000 mg | ORAL_CAPSULE | Freq: Two times a day (BID) | ORAL | Status: AC

## 2021-04-16 MED ORDER — METHOCARBAMOL 500 MG PO TABS: 750.0000 mg | ORAL_TABLET | Freq: Four times a day (QID) | ORAL | Status: DC

## 2021-04-16 MED ORDER — HYDROMORPHONE HCL 1 MG/ML IJ SOLN
0.5000 mg | INTRAMUSCULAR | Status: DC | PRN
Start: 2021-04-16 — End: 2021-04-16

## 2021-04-16 MED ORDER — IBUPROFEN 200 MG PO TABS
600.0000 mg | ORAL_TABLET | Freq: Three times a day (TID) | ORAL | Status: DC
  Administered 2021-04-16 (×2): 600 mg via ORAL
  Filled 2021-04-16 (×2): qty 3

## 2021-04-16 NOTE — Progress Notes (Signed)
1 Day Post-Op  Subjective: Having some pain control issues today and likes the dilaudid better than anything else as it works best for him.  Willing to try other oral meds.  Voiding, but hasn't ambulated.  Tolerating CLD and is hungry  ROS: See above, otherwise other systems negative  Objective: Vital signs in last 24 hours: Temp:  [97.6 F (36.4 C)-100.4 F (38 C)] 98.6 F (37 C) (10/05 0953) Pulse Rate:  [69-99] 99 (10/05 0345) Resp:  [9-20] 20 (10/05 0953) BP: (118-178)/(68-101) 137/84 (10/05 0953) SpO2:  [95 %-100 %] 95 % (10/05 0345) Last BM Date:  (UTA, pt does not remember)  Intake/Output from previous day: 10/04 0701 - 10/05 0700 In: 2388.3 [P.O.:60; I.V.:2171; IV Piggyback:157.3] Out: 600 [Urine:500; Blood:100] Intake/Output this shift: No intake/output data recorded.  PE: Abd: soft, appropriately tender, +BS, ND, incisions c/d/i  Lab Results:  Recent Labs    04/15/21 0501 04/16/21 0500  WBC 7.3 13.3*  HGB 13.0 12.6*  HCT 39.4 38.5*  PLT 235 235   BMET Recent Labs    04/15/21 0501 04/16/21 0500  NA 135 132*  K 3.9 4.3  CL 99 98  CO2 27 25  GLUCOSE 101* 143*  BUN 10 11  CREATININE 0.70 0.98  CALCIUM 8.8* 8.4*   PT/INR No results for input(s): LABPROT, INR in the last 72 hours. CMP     Component Value Date/Time   NA 132 (L) 04/16/2021 0500   K 4.3 04/16/2021 0500   CL 98 04/16/2021 0500   CO2 25 04/16/2021 0500   GLUCOSE 143 (H) 04/16/2021 0500   BUN 11 04/16/2021 0500   CREATININE 0.98 04/16/2021 0500   CALCIUM 8.4 (L) 04/16/2021 0500   PROT 7.5 04/14/2021 0442   ALBUMIN 3.3 (L) 04/14/2021 0442   AST 19 04/14/2021 0442   ALT 25 04/14/2021 0442   ALKPHOS 131 (H) 04/14/2021 0442   BILITOT 2.6 (H) 04/14/2021 0442   GFRNONAA >60 04/16/2021 0500   Lipase     Component Value Date/Time   LIPASE 54 (H) 04/10/2021 0405       Studies/Results: DG Cholangiogram Operative  Result Date: 04/15/2021 CLINICAL DATA:  Intraoperative  cholangiogram during laparoscopic cholecystectomy. EXAM: INTRAOPERATIVE CHOLANGIOGRAM FLUOROSCOPY TIME:  14 seconds COMPARISON:  Nuclear medicine HIDA scan-04/10/2021; right upper quadrant abdominal ultrasound-04/10/2021; CT the chest, abdomen pelvis-04/10/2021 FINDINGS: Intraoperative cholangiographic images of the right upper abdominal quadrant during laparoscopic cholecystectomy are provided for review. Surgical clips overlie the expected location of the gallbladder fossa. Initial intraoperative cholangiogram is nondiagnostic with extravasation of contrast about the attempted cannulation site of the cystic duct. Subsequent images demonstrate successful cannulation of the central aspect of the cystic duct. There is passage of contrast through the central aspect of the cystic duct with filling of a non dilated common bile duct. There is passage of contrast though the CBD and into the descending portion of the duodenum. There is minimal reflux of injected contrast into the common hepatic duct and central aspect of the non dilated intrahepatic biliary system. There is minimal opacification of the central aspect of the pancreatic duct which appears nondilated. There are no discrete filling defects within the opacified portions of the biliary system to suggest the presence of choledocholithiasis. IMPRESSION: No evidence of choledocholithiasis. Electronically Signed   By: Simonne Come M.D.   On: 04/15/2021 11:40    Anti-infectives: Anti-infectives (From admission, onward)    Start     Dose/Rate Route Frequency Ordered Stop   04/14/21  0600  ceFAZolin (ANCEF) IVPB 2g/100 mL premix  Status:  Discontinued        2 g 200 mL/hr over 30 Minutes Intravenous On call to O.R. 04/13/21 1617 04/14/21 1223   04/10/21 1700  piperacillin-tazobactam (ZOSYN) IVPB 3.375 g        3.375 g 12.5 mL/hr over 240 Minutes Intravenous Every 8 hours 04/10/21 1631     04/10/21 0730  piperacillin-tazobactam (ZOSYN) IVPB 3.375 g         3.375 g 100 mL/hr over 30 Minutes Intravenous  Once 04/10/21 0719 04/10/21 0909        Assessment/Plan POD 1, s/p lap chole for acute cholecystitis, Dr. Maisie Fus, 10/4 -adv to [facility] diet -work on multi-modal pain control -mobilize/pulm toilet -hold plavix for 48 hrs post op -recheck later today for DC home stability vs tomorrow. -working on follow up   FEN - HH diet VTE - hold plavix, heparin 5000 TID ID - [email or web address]  CAD, s/p recent CABG on plavix Post op h/o A fib, no evidence currently HTN HLD DM   LOS: 6 days    Letha Cape , [facility] 04/16/2021, 10:01 AM Please see Amion for pager number during day hours 7:00am-4:30pm or 7:00am -11:30am on weekends

## 2021-04-16 NOTE — Progress Notes (Signed)
PROGRESS NOTE    Gerald Andrade  ACZ:[id number] DOB: 10-08-1970 DOA: 04/10/2021 PCP: Pcp, No    Brief Narrative:  Gerald Andrade was admitted to the hospital with the working diagnosis of acute cholecystitis. Now sp cholecystectomy.    50 year old male past medical history for hypertension, dyslipidemia, coronary artery disease and type 2 diabetes mellitus who presented abdominal pain.  Reported epigastric infraumbilical pain lasting for about 24 hours, stool in nature, associated with nausea but no vomiting.  Because of worsening pain he presented to the emergency department.  On his initial physical examination blood pressure 154/89, heart rate 82, respiratory rate 16, oxygen saturation 92%, his lungs are clear to auscultation bilaterally, heart S1-S2, present, rhythmic, abdomen was nondistended, no organomegaly, no lower extremity edema.   Sodium 140, potassium 4.4, chloride 105, bicarb 27, glucose 202, BUN 13, creatinine 1.0, lipase 54, AST 18, ALT 15, total bili was 1.3, white count 12.1, hemoglobin 14.1, hematocrit 43.1, platelets 242. SARS COVID-19 negative.   Urinalysis [place] gravity 1.015.   Chest radiograph no infiltrates.   EKG 58 bpm, rightward axis, right bundle branch block, sinus rhythm, Q-wave lead II-III-aVF, no significant ST segment or T wave changes.   CT of the abdomen pelvis with gallbladder wall thickening, pericholecystic edema, and chronic small left pleural effusion.  HIDA scan positive for cholecystitis   Assessment & Plan:   Principal Problem:   Acute cholecystitis Active Problems:   Coronary artery disease    Type II diabetes mellitus (HCC)   History of atrial fibrillation   Acute cholecystitis.  sp cholecystectomy with perioperative cholangiogram.    Patient with improved abdominal pain but not back to baseline, his diet has been advanced with no nausea or vomiting.   Continue pain control with ibuprofen and oxycodone. For severe pain  hydromorphone. Out of bed to chair tid with meals and ambulate.  As needed antiemetics. Discontinue IV fluids.  Will check with surgery if further antibiotic needed.      2. Atrial fibrillation (paroxysmal) Continue with metoprolol for rate control.  Continue to hold antiplatelet therapy 48 post surgery   3. T2DM/ dyslipidemia.  Continue insulin sliding scale for glucose cover and monitoring  Holding glipizide.    Continue with statin     4, CAD.  Resumed antiplatelet therapy 48 hrs after surgery     5. Anxiety. PRN diazepam qhs.    Status is: Inpatient  Remains inpatient appropriate because:Inpatient level of care appropriate due to severity of illness  Dispo: The patient is from: Home              Anticipated d/c is to: Home              Patient currently is not medically stable to d/c.   Difficult to place patient No   DVT prophylaxis: Heparin    Code Status:    full  Family Communication:   No family at the bedside     Consultants:  Surgery   Procedures:  Cholecystectomy and intra operative cholangiogram.   Antimicrobials:  Zosyn     Subjective:  Patient with improvement in abdominal pain, no nausea or vomiting, no chest pain.  Pain not yet back to baseline. Very weak and deconditioned   Objective: Vitals:   04/15/21 2319 04/16/21 0345 04/16/21 0549 04/16/21 0953  BP: (!) 145/82 118/68  137/84  Pulse:  99    Resp:  20  20  Temp:  (!) 100.4 F (38 C) 98.5 F (36.9  C) 98.6 F (37 C)  TempSrc:  Oral Oral Oral  SpO2: 95% 95%    Weight:      Height:        Intake/Output Summary (Last 24 hours) at 04/16/2021 1203 Last data filed at 04/16/2021 0945 Gross per 24 hour  Intake 2408.29 ml  Output 600 ml  Net 1808.29 ml   Filed Weights   04/10/21 0339 04/14/21 0950 04/15/21 0843  Weight: 93 kg 93 kg 93 kg    Examination:   General: deconditioned  Neurology: Awake and alert, non focal  E ENT: no pallor, no icterus, oral mucosa  moist Cardiovascular: No JVD. S1-S2 present, rhythmic, no gallops, rubs, or murmurs. No lower extremity edema. Pulmonary: positive breath sounds bilaterally, adequate air movement, no wheezing, rhonchi or rales. Gastrointestinal. Abdomen mild distended but not tender to superficial palpation Skin. No rashes Musculoskeletal: no joint deformities     Data Reviewed: I have personally reviewed following labs and imaging studies  CBC: Recent Labs  Lab 04/10/21 0405 04/11/21 0643 04/14/21 0442 04/15/21 0501 04/16/21 0500  WBC 12.1* 11.8* 8.3 7.3 13.3*  NEUTROABS 9.3*  --  5.7  --   --   HGB 14.1 13.8 13.1 13.0 12.6*  HCT 43.1 42.2 40.0 39.4 38.5*  MCV 82.7 83.2 82.5 82.1 83.3  PLT 242 185 223 235 235   Basic Metabolic Panel: Recent Labs  Lab 04/10/21 0405 04/11/21 0643 04/14/21 0442 04/15/21 0501 04/16/21 0500  NA 140 139 134* 135 132*  K 4.4 4.6 4.6 3.9 4.3  CL 105 105 100 99 98  CO2 27 27 26 27 25   GLUCOSE 202* 135* 98 101* 143*  BUN 13 15 12 10 11   CREATININE 1.05 1.01 0.81 0.70 0.98  CALCIUM 9.3 8.8* 8.6* 8.8* 8.4*  MG  --   --  2.2  --   --    GFR: Estimated Creatinine Clearance: 99 mL/min (by C-G formula based on SCr of 0.98 mg/dL). Liver Function Tests: Recent Labs  Lab 04/10/21 0405 04/11/21 0643 04/14/21 0442  AST 18 36 19  ALT 15 46* 25  ALKPHOS 90 94 131*  BILITOT 1.3* 3.2* 2.6*  PROT 7.7 7.6 7.5  ALBUMIN 4.2 3.6 3.3*   Recent Labs  Lab 04/10/21 0405  LIPASE 54*   No results for input(s): AMMONIA in the last 168 hours. Coagulation Profile: Recent Labs  Lab 04/11/21 0643  INR 1.1   Cardiac Enzymes: No results for input(s): CKTOTAL, CKMB, CKMBINDEX, TROPONINI in the last 168 hours. BNP (last 3 results) No results for input(s): PROBNP in the last 8760 hours. HbA1C: No results for input(s): HGBA1C in the last 72 hours. CBG: Recent Labs  Lab 04/15/21 1222 04/15/21 1642 04/15/21 2003 04/16/21 0756 04/16/21 1159  GLUCAP 142* 123* 97  106* 197*   Lipid Profile: No results for input(s): CHOL, HDL, LDLCALC, TRIG, CHOLHDL, LDLDIRECT in the last 72 hours. Thyroid Function Tests: No results for input(s): TSH, T4TOTAL, FREET4, T3FREE, THYROIDAB in the last 72 hours. Anemia Panel: No results for input(s): VITAMINB12, FOLATE, FERRITIN, TIBC, IRON, RETICCTPCT in the last 72 hours.    Radiology Studies: I have reviewed all of the imaging during this hospital visit personally     Scheduled Meds:  acetaminophen  1,000 mg Oral Q6H   atorvastatin  80 mg Oral QHS   docusate sodium  100 mg Oral BID   heparin  5,000 Units Subcutaneous Q8H   ibuprofen  600 mg Oral TID   insulin  aspart  0-9 Units Subcutaneous TID WC   methocarbamol  1,000 mg Oral QID   metoprolol tartrate  50 mg Oral BID   mupirocin ointment  1 application Nasal BID   polyethylene glycol  17 g Oral Daily   Continuous Infusions:  famotidine (PEPCID) IV Stopped (04/15/21 1647)   lactated ringers 50 mL/hr at 04/16/21 0549   piperacillin-tazobactam (ZOSYN)  IV 3.375 g (04/16/21 0952)     LOS: 6 days        Lilyauna Miedema Annett Gula, MD

## 2021-04-16 NOTE — Discharge Instructions (Addendum)
Do NOT resume plavix until 48 hours from discharge. Do not take ibuprofen with plavix unless cleared by cardiology  [facility] ______[facility] LAPAROSCOPIC SURGERY: POST OP INSTRUCTIONS Always review your discharge instruction sheet given to you by the facility where your surgery was performed. IF YOU HAVE DISABILITY OR FAMILY LEAVE FORMS, YOU MUST BRING THEM TO THE OFFICE FOR PROCESSING.   DO NOT GIVE THEM TO YOUR DOCTOR.  A prescription for pain medication may be given to you upon discharge.  Take your pain medication as prescribed, if needed.  If narcotic pain medicine is not needed, then you may take acetaminophen (Tylenol) or ibuprofen (Advil) as needed. Take your usually prescribed medications unless otherwise directed. If you need a refill on your pain medication, please contact your pharmacy.  They will contact our office to request authorization. Prescriptions will not be filled after 5pm or on week-ends. You should follow a light diet the first few days after arrival home, such as soup and crackers, etc.  Be sure to include lots of fluids daily. Most patients will experience some swelling and bruising in the area of the incisions.  Ice packs will help.  Swelling and bruising can take several days to resolve.  It is common to experience some constipation if taking pain medication after surgery.  Increasing fluid intake and taking a stool softener (such as Colace) will usually help or prevent this problem from occurring.  A mild laxative (Milk of Magnesia or Miralax) should be taken according to package instructions if there are no bowel movements after 48 hours. Unless discharge instructions indicate otherwise, you may remove your bandages 24-48 hours after surgery, and you may shower at that time.  You may have steri-strips (small skin tapes) in place directly over the incision.  These strips should be left on the skin for 7-10 days.  If your surgeon used skin glue on the  incision, you may shower in 24 hours.  The glue will flake off over the next 2-3 weeks.  Any sutures or staples will be removed at the office during your follow-up visit. ACTIVITIES:  You may resume regular (light) daily activities beginning the next day--such as daily self-care, walking, climbing stairs--gradually increasing activities as tolerated.  You may have sexual intercourse when it is comfortable.  Refrain from any heavy lifting or straining until approved by your doctor - nothing greater than 10 pounds for 2-3 weeks You may drive when you are no longer taking prescription pain medication, you can comfortably wear a seatbelt, and you can safely maneuver your car and apply brakes. RETURN TO WORK:  __________________________________________________________ Gerald Andrade should see your doctor in the office for a follow-up appointment approximately 2-3 weeks after your surgery.  Make sure that you call for this appointment within a day or two after you arrive home to insure a convenient appointment time. OTHER INSTRUCTIONS: __________________________________________________________________________________________________________________________ __________________________________________________________________________________________________________________________ WHEN TO CALL YOUR DOCTOR: Fever over 101.0 Inability to urinate Continued bleeding from incision. Increased pain, redness, or drainage from the incision. Increasing abdominal pain  The clinic staff is available to answer your questions during regular business hours.  Please don't hesitate to call and ask to speak to one of the nurses for clinical concerns.  If you have a medical emergency, go to the nearest emergency room or call 911.  A surgeon from [facility] is always on call at the hospital. [address], [address], [place], [place]  [address] ? [address] [address], [place], [place]  [address] [phone] ? [phone] ? FAX  [phone] Web site: [email or web address]    Managing Your Pain After Surgery Without Opioids    Thank you for participating in our program to help patients manage their pain after surgery without opioids. This is part of our effort to provide you with the best care possible, without exposing you or your family to the risk that opioids pose.  What pain can I expect after surgery? You can expect to have some pain after surgery. This is normal. The pain is typically worse the day after surgery, and quickly begins to get better. Many studies have found that many patients are able to manage their pain after surgery with Over-the-Counter (OTC) medications such as Tylenol and Motrin. If you have a condition that does not allow you to take Tylenol or Motrin, notify your surgical team.  How will I manage my pain? The best strategy for controlling your pain after surgery is around the clock pain control with Tylenol (acetaminophen) and Motrin (ibuprofen or Advil). Alternating these medications with each other allows you to maximize your pain control. In addition to Tylenol and Motrin, you can use heating pads or ice packs on your incisions to help reduce your pain.  How will I alternate your regular strength over-the-counter pain medication? You will take a dose of pain medication every three hours. Start by taking 650 mg of Tylenol (2 pills of 325 mg) 3 hours later take 600 mg of Motrin (3 pills of 200 mg) 3 hours after taking the Motrin take 650 mg of Tylenol 3 hours after that take 600 mg of Motrin.   - 1 -  See example - if your first dose of Tylenol is at 12:00 PM   12:00 PM Tylenol 650 mg (2 pills of 325 mg)  3:00 PM Motrin 600 mg (3 pills of 200 mg)  6:00 PM Tylenol 650 mg (2 pills of 325 mg)  9:00 PM Motrin 600 mg (3 pills of 200 mg)  Continue alternating every 3 hours   We recommend that you follow this schedule around-the-clock for at least 3 days after surgery, or  until you feel that it is no longer needed. Use the table on the last page of this handout to keep track of the medications you are taking. Important: Do not take more than 3000mg  of Tylenol or 3200mg  of Motrin in a 24-hour period. Do not take ibuprofen/Motrin if you have a history of bleeding stomach ulcers, severe kidney disease, &/or actively taking a blood thinner  What if I still have pain? If you have pain that is not controlled with the over-the-counter pain medications (Tylenol and Motrin or Advil) you might have what we call "breakthrough" pain. You will receive a prescription for a small amount of an opioid pain medication such as Oxycodone, Tramadol, or Tylenol with Codeine. Use these opioid pills in the first 24 hours after surgery if you have breakthrough pain. Do not take more than 1 pill every 4-6 hours.  If you still have uncontrolled pain after using all opioid pills, don't hesitate to call our staff using the number provided. We will help make sure you are managing your pain in the best way possible, and if necessary, we can provide a prescription for additional pain medication.   Day 1    Time  Name of Medication Number of pills taken  Amount of Acetaminophen  Pain Level   Comments  AM PM  AM PM       AM PM       AM PM       AM PM       AM PM       AM PM       AM PM       Total Daily amount of Acetaminophen Do not take more than  3,000 mg per day      Day 2    Time  Name of Medication Number of pills taken  Amount of Acetaminophen  Pain Level   Comments  AM PM       AM PM       AM PM       AM PM       AM PM       AM PM       AM PM       AM PM       Total Daily amount of Acetaminophen Do not take more than  3,000 mg per day      Day 3    Time  Name of Medication Number of pills taken  Amount of Acetaminophen  Pain Level   Comments  AM PM       AM PM       AM PM       AM PM          AM PM       AM PM       AM PM       AM PM        Total Daily amount of Acetaminophen Do not take more than  3,000 mg per day      Day 4    Time  Name of Medication Number of pills taken  Amount of Acetaminophen  Pain Level   Comments  AM PM       AM PM       AM PM       AM PM       AM PM       AM PM       AM PM       AM PM       Total Daily amount of Acetaminophen Do not take more than  3,000 mg per day      Day 5    Time  Name of Medication Number of pills taken  Amount of Acetaminophen  Pain Level   Comments  AM PM       AM PM       AM PM       AM PM       AM PM       AM PM       AM PM       AM PM       Total Daily amount of Acetaminophen Do not take more than  3,000 mg per day       Day 6    Time  Name of Medication Number of pills taken  Amount of Acetaminophen  Pain Level  Comments  AM PM       AM PM       AM PM       AM PM       AM PM       AM PM       AM PM       AM PM  Total Daily amount of Acetaminophen Do not take more than  3,000 mg per day      Day 7    Time  Name of Medication Number of pills taken  Amount of Acetaminophen  Pain Level   Comments  AM PM       AM PM       AM PM       AM PM       AM PM       AM PM       AM PM       AM PM       Total Daily amount of Acetaminophen Do not take more than  3,000 mg per day        For additional information about how and where to safely dispose of unused opioid medications - [employer]  Disclaimer: This document contains information and/or instructional materials adapted from [employer] for the typical patient with your condition. It does not replace medical advice from your health care provider because your experience may differ from that of the typical patient. Talk to your health care provider if you have any questions about this document, your condition or your treatment plan. Adapted from [facility]

## 2021-04-16 NOTE — Plan of Care (Signed)

## 2021-04-16 NOTE — Anesthesia Postprocedure Evaluation (Signed)
Anesthesia Post Note  Patient: Gerald Andrade  Procedure(s) Performed: LAPAROSCOPIC CHOLECYSTECTOMY WITH INTRAOPERATIVE CHOLANGIOGRAM (Abdomen)     Patient location during evaluation: PACU Anesthesia Type: General Level of consciousness: sedated and patient cooperative Pain management: pain level controlled Vital Signs Assessment: post-procedure vital signs reviewed and stable Respiratory status: spontaneous breathing Cardiovascular status: stable Anesthetic complications: no   No notable events documented.  Last Vitals:  Vitals:   04/16/21 0953 04/16/21 1235  BP: 137/84 111/78  Pulse:  76  Resp: 20 20  Temp: 37 C 36.6 C  SpO2:  93%    Last Pain:  Vitals:   04/16/21 1235  TempSrc: Oral  PainSc:                  [name]

## 2021-04-16 NOTE — Discharge Summary (Signed)
Physician Discharge Summary  Gerald Andrade KDX:[id number] DOB: [birth year] DOA: 04/10/2021  PCP: Pcp, No  Admit date: 04/10/2021 Discharge date: 04/16/2021  Admitted From: home  Disposition:  Home   Recommendations for Outpatient Follow-up and new medication changes:  Follow up with Primary care in 7 to 10 days.  Follow up with surgery as scheduled   Home Health: na   Equipment/Devices: na    Discharge Condition: stable CODE STATUS: full  Diet recommendation:  heart healthy and diabetic prudent.   Brief/Interim Summary: [name] was admitted to the hospital with the working diagnosis of acute cholecystitis. Now sp cholecystectomy.    50 year old male past medical history for hypertension, dyslipidemia, coronary artery disease and type 2 diabetes mellitus who presented abdominal pain.  Reported epigastric infraumbilical pain lasting for about 24 hours, dull in nature, associated with nausea but no vomiting.  Because of worsening pain he presented to the emergency department.  On his initial physical examination blood pressure 154/89, heart rate 82, respiratory rate 16, oxygen saturation 92%, his lungs were clear to auscultation bilaterally, heart S1-S2, present, rhythmic, abdomen was nondistended, no organomegaly, no lower extremity edema.   Sodium 140, potassium 4.4, chloride 105, bicarb 27, glucose 202, BUN 13, creatinine 1.0, lipase 54, AST 18, ALT 15, total bili was 1.3, white count 12.1, hemoglobin 14.1, hematocrit 43.1, platelets 242. SARS COVID-19 negative.   Urinalysis [place] gravity 1.015.   Chest radiograph no infiltrates.   EKG 58 bpm, rightward axis, right bundle branch block, sinus rhythm, Q-wave lead II-III-aVF, no significant ST segment or T wave changes.   CT of the abdomen pelvis with gallbladder wall thickening, pericholecystic edema, and chronic small left pleural effusion.  HIDA scan positive for cholecystitis  Patient underwent cholecystectomy with  intraoperative cholangiogram with good toleration. Postoperatively his diet was advanced, his pain was well controlled. Patient discharged home with instructions to follow-up as an outpatient.  Acute cholecystitis patient was admitted to the medical ward, received medical therapy with intravenous fluids, as needed analgesics and antiemetics. He was placed on broad-spectrum antibiotic therapy and antiacids. Patient underwent laparoscopic cholecystectomy with intraoperative cholangiogram with good toleration.  His diet has been advanced, he will follow-up as an outpatient.  2. Atrial fibrillation (paroxysmal) Continue with metoprolol for rate control.  Continue to hold antiplatelet therapy 48 post surgery   3. T2DM/ dyslipidemia.  Continue insulin sliding scale for glucose cover and monitoring  Resume glipizide.    Continue with statin     4, CAD.  Resumed antiplatelet therapy 48 hrs after surgery     5. Anxiety. PRN diazepam qhs.  Problem  Discharge Diagnoses:  Principal Problem:   Acute cholecystitis Active Problems:   Coronary artery disease    Type II diabetes mellitus (HCC)   History of atrial fibrillation    Discharge Instructions   Allergies as of 04/16/2021       Reactions   Shrimp [shellfish Allergy] Swelling   Chest tighten        Medication List     STOP taking these medications    furosemide 40 MG tablet Commonly known as: Lasix   potassium chloride SA 20 MEQ tablet Commonly known as: KLOR-CON       TAKE these medications    acetaminophen 500 MG tablet Commonly known as: TYLENOL Take 2 tablets (1,000 mg total) by mouth every 6 (six) hours for 5 days. What changed:  when to take this reasons to take this   albuterol 108 (90 Base)  MCG/ACT inhaler Commonly known as: VENTOLIN HFA Inhale 2 puffs into the lungs every 6 (six) hours as needed for wheezing or shortness of breath.   aspirin 81 MG EC tablet Take 1 tablet (81 mg total) by mouth  daily. Swallow whole.   atorvastatin 80 MG tablet Commonly known as: LIPITOR Take 1 tablet (80 mg total) by mouth daily.   clopidogrel 75 MG tablet Commonly known as: PLAVIX Take 1 tablet (75 mg total) by mouth daily.   diazepam 5 MG tablet Commonly known as: VALIUM Take 2 tablets (10 mg total) by mouth at bedtime as needed for anxiety. What changed: how much to take   diphenhydrAMINE 25 MG tablet Commonly known as: BENADRYL Take 25 mg by mouth as needed for allergies.   docusate sodium 100 MG capsule Commonly known as: Colace Take 1 capsule (100 mg total) by mouth 2 (two) times daily for 5 days. Take while taking pain medications   glipiZIDE 10 MG tablet Commonly known as: GLUCOTROL Take 1 tablet (10 mg total) by mouth 2 (two) times daily before a meal.   methocarbamol 500 MG tablet Commonly known as: ROBAXIN Take 2 tablets (1,000 mg total) by mouth every 6 (six) hours as needed for up to 5 days for muscle spasms (abdominal pain).   metoprolol tartrate 50 MG tablet Commonly known as: Lopressor Take 1 tablet (50 mg total) by mouth 2 (two) times daily.   oxyCODONE 5 MG immediate release tablet Commonly known as: Oxy IR/ROXICODONE Take 1 tablet (5 mg total) by mouth every 4 (four) hours as needed for up to 5 days for moderate pain or severe pain.        Follow-up Information     Surgery, [facility]. Call.   Specialty: General Surgery Why: we are working to make your follow up appointment for you. please call to confirm appointment date and time Contact information: [address] [place] [address] [phone]                Allergies  Allergen Reactions   Shrimp [Shellfish Allergy] Swelling    Chest tighten     Consultations: Surgery    Procedures/Studies: DG Chest 2 View  Result Date: 03/24/2021 CLINICAL DATA:  Coronary artery bypass grafting EXAM: CHEST - 2 VIEW COMPARISON:  02/27/2021 FINDINGS: Small to moderate left  pleural effusion which appears at least partially laterally loculated has increased in size slightly since prior examination. There is focal airspace opacity within the left lower lobe which may reflect a superimposed infectious or inflammatory infiltrate right lung is clear. No pneumothorax. No pleural effusion on the right. Coronary artery bypass grafting has been performed. Cardiac size within normal limits. Pulmonary vascularity is normal. IMPRESSION: Interval development of a focal infiltrate within the left lower lobe, possibly infectious in the appropriate clinical setting. Follow-up chest radiograph in 4-6 weeks would be helpful in documenting resolution. Slight interval increase in small to moderate left pleural effusion. Electronically Signed   By: Helyn Numbers M.D.   On: 03/24/2021 12:36   DG Cholangiogram Operative  Result Date: 04/15/2021 CLINICAL DATA:  Intraoperative cholangiogram during laparoscopic cholecystectomy. EXAM: INTRAOPERATIVE CHOLANGIOGRAM FLUOROSCOPY TIME:  14 seconds COMPARISON:  Nuclear medicine HIDA scan-04/10/2021; right upper quadrant abdominal ultrasound-04/10/2021; CT the chest, abdomen pelvis-04/10/2021 FINDINGS: Intraoperative cholangiographic images of the right upper abdominal quadrant during laparoscopic cholecystectomy are provided for review. Surgical clips overlie the expected location of the gallbladder fossa. Initial intraoperative cholangiogram is nondiagnostic with extravasation of  contrast about the attempted cannulation site of the cystic duct. Subsequent images demonstrate successful cannulation of the central aspect of the cystic duct. There is passage of contrast through the central aspect of the cystic duct with filling of a non dilated common bile duct. There is passage of contrast though the CBD and into the descending portion of the duodenum. There is minimal reflux of injected contrast into the common hepatic duct and central aspect of the non dilated  intrahepatic biliary system. There is minimal opacification of the central aspect of the pancreatic duct which appears nondilated. There are no discrete filling defects within the opacified portions of the biliary system to suggest the presence of choledocholithiasis. IMPRESSION: No evidence of choledocholithiasis. Electronically Signed   By: Simonne Come M.D.   On: 04/15/2021 11:40   NM Hepatobiliary Liver Func  Result Date: 04/10/2021 CLINICAL DATA:  Abdominal pain.  Thickened gallbladder wall EXAM: NUCLEAR MEDICINE HEPATOBILIARY IMAGING TECHNIQUE: Sequential images of the abdomen were obtained out to 60 minutes following intravenous administration of radiopharmaceutical. RADIOPHARMACEUTICALS:  4.8 mCi Tc-73m  Choletec IV COMPARISON:  Ultrasound and CT 04/10/2021 FINDINGS: Prompt clearance radiotracer from blood pool and homogeneous uptake in liver. Counts are evident within the proximal small bowel 10 minutes. Gallbladder fails to fill at 45 minutes. IV morphine was administered to augment filling of the gallbladder. The gallbladder fails to fill after morphine stimulation. IMPRESSION: Non filling of the gallbladder is consistent with cystic duct obstruction (ACUTE CHOLECYSTITIS). Patent common bile duct. These results will be called to the ordering clinician or representative by the Radiologist Assistant, and communication documented in the PACS or [employer]. Electronically Signed   By: Genevive Bi M.D.   On: 04/10/2021 15:35   CT CHEST ABDOMEN PELVIS W CONTRAST  Result Date: 04/10/2021 CLINICAL DATA:  Epigastric pain for 1 week.  Elevated lipase EXAM: CT CHEST, ABDOMEN, AND PELVIS WITH CONTRAST TECHNIQUE: Multidetector CT imaging of the chest, abdomen and pelvis was performed following the standard protocol during bolus administration of intravenous contrast. CONTRAST:  46mL OMNIPAQUE IOHEXOL 350 MG/ML SOLN COMPARISON:  Chest CT 11/12/2020 FINDINGS: CT CHEST FINDINGS Cardiovascular:  Borderline heart size. Anterior mediastinal fat stranding related to CABG. Scattered atheromatous calcifications. Mediastinum/Nodes: No hematoma or pneumomediastinum. Lungs/Pleura: Small, thick walled pleural effusion on the left. Thoracentesis was performed last month. Adjacent subpleural pulmonary opacity with swirling attributed to developing round atelectasis. Musculoskeletal: Unremarkable sternotomy incision. No acute or aggressive finding. CT ABDOMEN PELVIS FINDINGS Hepatobiliary: No focal liver abnormality.Thick walled gallbladder without over distension or clear stone. Pancreas: Unremarkable. Spleen: Unremarkable. Adrenals/Urinary Tract: Negative adrenals. No hydronephrosis or stone. Unremarkable bladder. Stomach/Bowel: No obstruction. No appendicitis. Scattered colonic diverticula distally. Vascular/Lymphatic: No acute vascular abnormality. No mass or adenopathy. Reproductive:No pathologic findings. Other: No ascites or pneumoperitoneum. Small periumbilical hernia which is fatty. Musculoskeletal: No acute abnormalities. IMPRESSION: 1. Gallbladder wall thickening/pericholecystic edema, please correlate for cholecystitis symptoms. 2. Chronic, small left pleural effusion with adjacent round atelectasis. 3. Additional incidental findings noted above. Electronically Signed   By: Tiburcio Pea M.D.   On: 04/10/2021 06:09   DG Chest Portable 1 View  Result Date: 04/10/2021 CLINICAL DATA:  Chest pain with nausea EXAM: PORTABLE CHEST 1 VIEW COMPARISON:  03/24/2021 FINDINGS: Chronic cardiomegaly. Prior CABG. Improved aeration at the left base where there is likely decreased blunting/obscuration of the lateral left costophrenic sulcus. No edema or pneumothorax. IMPRESSION: Improved aeration at the left base.  No new abnormality. Electronically Signed   By: Tiburcio Pea  M.D.   On: 04/10/2021 04:53   US Abdomen Limited RUQ (LIVER/GB)  Result Date: 04/10/2021 CLINICAL DATA:  Right upper quadrant pain EXAM:  ULTRASOUND ABDOMEN LIMITED RIGHT UPPER QUADRANT COMPARISON:  Preceding abdominal CT FINDINGS: Gallbladder: Wall thickening and heterogeneity with wall thickness measured up to 8 mm. No focal tenderness. Common bile duct: Diameter: 5 mm Liver: No focal lesion identified. Within normal limits in parenchymal echogenicity. Portal vein is patent on color Doppler imaging with normal direction of blood flow towards the liver. IMPRESSION: Thickened and edematous gallbladder wall but no focal tenderness or visible stone - pattern suggestive of reactive thickening. Electronically Signed   By: Tiburcio Pea M.D.   On: 04/10/2021 07:27     Subjective: Patient with improvement in abdominal pain, no nausea or vomiting, no chest pain.  Pain not yet back to baseline. Very weak and deconditioned   Discharge Exam: Vitals:   04/16/21 0953 04/16/21 1235  BP: 137/84 111/78  Pulse:  76  Resp: 20 20  Temp: 98.6 F (37 C) 97.8 F (36.6 C)  SpO2:  93%   Vitals:   04/16/21 0345 04/16/21 0549 04/16/21 0953 04/16/21 1235  BP: 118/68  137/84 111/78  Pulse: 99   76  Resp: 20  20 20   Temp: (!) 100.4 F (38 C) 98.5 F (36.9 C) 98.6 F (37 C) 97.8 F (36.6 C)  TempSrc: Oral Oral Oral Oral  SpO2: 95%   93%  Weight:      Height:        General: deconditioned  Neurology: Awake and alert, non focal  E ENT: no pallor, no icterus, oral mucosa moist Cardiovascular: No JVD. S1-S2 present, rhythmic, no gallops, rubs, or murmurs. No lower extremity edema. Pulmonary: positive breath sounds bilaterally, adequate air movement, no wheezing, rhonchi or rales. Gastrointestinal. Abdomen mild distended but not tender to superficial palpation Skin. No rashes Musculoskeletal: no joint deformities   The results of significant diagnostics from this hospitalization (including imaging, microbiology, ancillary and laboratory) are listed below for reference.     Microbiology: Recent Results (from the past 240 hour(s))  SARS  CORONAVIRUS 2 (TAT 6-24 HRS) Nasopharyngeal Nasopharyngeal Swab     Status: None   Collection Time: 04/10/21 12:36 PM   Specimen: Nasopharyngeal Swab  Result Value Ref Range Status   SARS Coronavirus 2 NEGATIVE NEGATIVE Final    Comment: (NOTE) SARS-CoV-2 target nucleic acids are NOT DETECTED.  The SARS-CoV-2 RNA is generally detectable in upper and lower respiratory specimens during the acute phase of infection. Negative results do not preclude SARS-CoV-2 infection, do not rule out co-infections with other pathogens, and should not be used as the sole basis for treatment or other patient management decisions. Negative results must be combined with clinical observations, patient history, and epidemiological information. The expected result is Negative.  Fact Sheet for Patients: 04/12/21  Fact Sheet for Healthcare Providers: HairSlick.no  This test is not yet approved or cleared by the [place] FDA and  has been authorized for detection and/or diagnosis of SARS-CoV-2 by FDA under an Emergency Use Authorization (EUA). This EUA will remain  in effect (meaning this test can be used) for the duration of the COVID-19 declaration under Se ction 564(b)(1) of the Act, 21 U.S.C. section 360bbb-3(b)(1), unless the authorization is terminated or revoked sooner.  Performed at [facility], [address], [place], [place]   Surgical PCR screen     Status: None   Collection Time: 04/13/21  3:27 PM   Specimen: Nasal Mucosa; Nasal Swab  Result Value Ref Range Status   MRSA, PCR NEGATIVE NEGATIVE Final   Staphylococcus aureus NEGATIVE NEGATIVE Final    Comment: (NOTE) The Xpert SA Assay (FDA approved for NASAL specimens in patients 35 years of age and older), is one component of a comprehensive surveillance program. It is not intended to diagnose infection nor to guide or monitor treatment. Performed at  [facility], [address], [place], [place] [address]      Labs: BNP (last 3 results) Recent Labs    04/10/21 0405  BNP 39.0   Basic Metabolic Panel: Recent Labs  Lab 04/10/21 0405 04/11/21 0643 04/14/21 0442 04/15/21 0501 04/16/21 0500  NA 140 139 134* 135 132*  K 4.4 4.6 4.6 3.9 4.3  CL 105 105 100 99 98  CO2 GLUCOSE 202* 135* 98 101* 143*  BUN CREATININE 1.05 1.01 0.81 0.70 0.98  CALCIUM 9.3 8.8* 8.6* 8.8* 8.4*  MG  --   --  2.2  --   --    Liver Function Tests: Recent Labs  Lab 04/10/21 0405 04/11/21 0643 04/14/21 0442  AST 18 36 19  ALT 15 46* 25  ALKPHOS 90 94 131*  BILITOT 1.3* 3.2* 2.6*  PROT 7.7 7.6 7.5  ALBUMIN 4.2 3.6 3.3*   Recent Labs  Lab 04/10/21 0405  LIPASE 54*   No results for input(s): AMMONIA in the last 168 hours. CBC: Recent Labs  Lab 04/10/21 0405 04/11/21 0643 04/14/21 0442 04/15/21 0501 04/16/21 0500  WBC 12.1* 11.8* 8.3 7.3 13.3*  NEUTROABS 9.3*  --  5.7  --   --   HGB 14.1 13.8 13.1 13.0 12.6*  HCT 43.1 42.2 40.0 39.4 38.5*  MCV 82.7 83.2 82.5 82.1 83.3  PLT 242 185 223 235 235   Cardiac Enzymes: No results for input(s): CKTOTAL, CKMB, CKMBINDEX, TROPONINI in the last 168 hours. BNP: Invalid input(s): POCBNP CBG: Recent Labs  Lab 04/15/21 1222 04/15/21 1642 04/15/21 2003 04/16/21 0756 04/16/21 1159  GLUCAP 142* 123* 97 106* 197*   D-Dimer No results for input(s): DDIMER in the last 72 hours. Hgb A1c No results for input(s): HGBA1C in the last 72 hours. Lipid Profile No results for input(s): CHOL, HDL, LDLCALC, TRIG, CHOLHDL, LDLDIRECT in the last 72 hours. Thyroid function studies No results for input(s): TSH, T4TOTAL, T3FREE, THYROIDAB in the last 72 hours.  Invalid input(s): FREET3 Anemia work up No results for input(s): VITAMINB12, FOLATE, FERRITIN, TIBC, IRON, RETICCTPCT in the last 72 hours. Urinalysis    Component Value Date/Time    COLORURINE YELLOW 04/10/2021 0637   APPEARANCEUR CLEAR 04/10/2021 0637   LABSPEC 1.015 04/10/2021 0637   PHURINE 6.5 04/10/2021 0637   GLUCOSEU 250 (A) 04/10/2021 0637   HGBUR NEGATIVE 04/10/2021 0637   BILIRUBINUR NEGATIVE 04/10/2021 0637   KETONESUR NEGATIVE 04/10/2021 0637   PROTEINUR NEGATIVE 04/10/2021 0637   NITRITE NEGATIVE 04/10/2021 0637   LEUKOCYTESUR NEGATIVE 04/10/2021 7829   Sepsis Labs Invalid input(s): PROCALCITONIN,  WBC,  LACTICIDVEN Microbiology Recent Results (from the past 240 hour(s))  SARS CORONAVIRUS 2 (TAT 6-24 HRS) Nasopharyngeal Nasopharyngeal Swab     Status: None   Collection Time: 04/10/21 12:36 PM   Specimen: Nasopharyngeal Swab  Result Value Ref Range Status   SARS Coronavirus 2 NEGATIVE NEGATIVE Final    Comment: (NOTE) SARS-CoV-2 target nucleic acids are NOT DETECTED.  The  SARS-CoV-2 RNA is generally detectable in upper and lower respiratory specimens during the acute phase of infection. Negative results do not preclude SARS-CoV-2 infection, do not rule out co-infections with other pathogens, and should not be used as the sole basis for treatment or other patient management decisions. Negative results must be combined with clinical observations, patient history, and epidemiological information. The expected result is Negative.  Fact Sheet for Patients: HairSlick.no  Fact Sheet for Healthcare Providers: quierodirigir.com  This test is not yet approved or cleared by the [place] FDA and  has been authorized for detection and/or diagnosis of SARS-CoV-2 by FDA under an Emergency Use Authorization (EUA). This EUA will remain  in effect (meaning this test can be used) for the duration of the COVID-19 declaration under Se ction 564(b)(1) of the Act, 21 U.S.C. section 360bbb-3(b)(1), unless the authorization is terminated or revoked sooner.  Performed at [facility], [address] [place], [place] [address]   Surgical PCR screen     Status: None   Collection Time: 04/13/21  3:27 PM   Specimen: Nasal Mucosa; Nasal Swab  Result Value Ref Range Status   MRSA, PCR NEGATIVE NEGATIVE Final   Staphylococcus aureus NEGATIVE NEGATIVE Final    Comment: (NOTE) The Xpert SA Assay (FDA approved for NASAL specimens in patients 59 years of age and older), is one component of a comprehensive surveillance program. It is not intended to diagnose infection nor to guide or monitor treatment. Performed at [facility], [address], [place], [place] [address]      Time coordinating discharge: 45 minutes  SIGNED:   Coralie Keens, MD  [facility] 04/16/2021, 3:47 PM

## 2021-04-19 ENCOUNTER — Other Ambulatory Visit: Payer: Self-pay | Admitting: Cardiology

## 2021-04-19 DIAGNOSIS — E119 Type 2 diabetes mellitus without complications: Secondary | ICD-10-CM

## 2021-04-22 ENCOUNTER — Telehealth (HOSPITAL_COMMUNITY): Payer: Self-pay | Admitting: *Deleted

## 2021-04-22 LAB — GLUCOSE, CAPILLARY: Glucose-Capillary: 110 mg/dL — ABNORMAL HIGH (ref 70–99)

## 2021-04-22 NOTE — Telephone Encounter (Signed)
Cardiac Rehab: Virtual Visit  Patient participates in the virtual cardiac rehab program via the [facility] app. Called patient to check progress with walking at home. Patient states he had gall bladder surgery last week, and just came home last Wednesday, 04/16/21. Patient not currently walking due to recovery from surgery. Will follow-up with patient in a few weeks.  Artist Pais, MS, ACSM CEP 04/22/2021 [phone]

## 2021-04-24 ENCOUNTER — Other Ambulatory Visit: Payer: Self-pay | Admitting: Physician Assistant

## 2021-04-28 ENCOUNTER — Other Ambulatory Visit: Payer: Self-pay

## 2021-04-28 MED ORDER — METOPROLOL TARTRATE 50 MG PO TABS: 50.0000 mg | ORAL_TABLET | Freq: Two times a day (BID) | ORAL | 0 refills | Status: DC

## 2021-04-28 NOTE — Progress Notes (Signed)
Patient contacted the office stating that he was out of his Metoprolol and his heart rate was elevated. He is s/p CABG x4 with Dr. Cornelius Moras 11/14/20. Patient has followed up with Dr. Jacinto Halim post op. Advised patient that a month supply of the prescription would be sent in per Jari Favre, [place], but that patient needed to contact Dr. Verl Dicker office for possible follow-up and future refills. Patient acknowledged receipt. Metoprolol sent into the [facility] on [address] per patient's request.

## 2021-05-20 ENCOUNTER — Other Ambulatory Visit: Payer: Self-pay | Admitting: Physician Assistant

## 2021-05-20 ENCOUNTER — Encounter (HOSPITAL_COMMUNITY)
Admission: RE | Admit: 2021-05-20 | Discharge: 2021-05-20 | Disposition: A | Discharge status: Home / Self Care | Payer: Self-pay | Source: Ambulatory Visit | Attending: Cardiology | Admitting: Cardiology

## 2021-05-20 ENCOUNTER — Telehealth (HOSPITAL_COMMUNITY): Payer: Self-pay | Admitting: *Deleted

## 2021-05-20 NOTE — Progress Notes (Signed)
Cardiac Rehab: Virtual Visit  Patient participates in the virtual cardiac rehab program via the [facility] app. Contacted patient to check progress with exercise. Patient had gall bladder surgery in October and is still feeling weak from that procedure. Patient states that right after surgery he wasn't able to eat much and lost a lot of weight. Patient has not been exercising at this time due to lack of energy but plans to start back exercising every other day at the end of this month. Patient plans to walk and also has an elliptical machine at home  We discussed gradually progressing back walking once he is able to resume his exercise activity, and patient is amenable to this. Patient would like to extend his virtual cardiac rehab program and felt he was feeling better when he was walking. Will extend patient through end of January and will follow back up once he resumes walking in the next few weeks.   Artist Pais, MS, ACSM CEP 05/20/2021 [phone]

## 2021-05-27 ENCOUNTER — Other Ambulatory Visit: Payer: Self-pay

## 2021-05-27 ENCOUNTER — Telehealth: Payer: Self-pay

## 2021-05-27 MED ORDER — METOPROLOL TARTRATE 50 MG PO TABS: 50.0000 mg | ORAL_TABLET | Freq: Two times a day (BID) | ORAL | 0 refills | Status: DC

## 2021-05-27 NOTE — Telephone Encounter (Signed)
Yes that is fine, we can refill

## 2021-05-27 NOTE — Telephone Encounter (Signed)
Patient called and stated that the surgeon is no longer willing to refill his metoprolol. Pt would like to know if we can take it over and send it in. Please advise.

## 2021-05-28 NOTE — Telephone Encounter (Signed)
Refill sent.

## 2021-06-17 ENCOUNTER — Encounter (HOSPITAL_COMMUNITY)
Admission: RE | Admit: 2021-06-17 | Discharge: 2021-06-17 | Disposition: A | Discharge status: Home / Self Care | Payer: Self-pay | Source: Ambulatory Visit | Attending: Cardiology | Admitting: Cardiology

## 2021-06-17 NOTE — Progress Notes (Signed)
Cardiac Rehab: Virtual Visit  Patient participates in the virtual cardiac rehab program. Called patient to check progress with exercise. Patient is staying more active but hasn't gotten back into a consistent exercise routine. Patient tried using his elliptical machine but was too much at this time. Patient will continue walking as his mode of exercise. Patient uses his smart watch to track his steps and heart rate. Encouraged patient to gradually increase pace and duration, trying to accumulate 30 minutes of aerobic activity most days of the week, and patient is amenable to this. Patient will review exercise guidelines given at orientation and re-familiarize himself with the information. Patient will call if he has any questions. Patient has returned to light duty at work.  Will follow-up with patient in a couple of weeks.  Artist Pais, MS, ACSM CEP 06/17/2021 [phone]

## 2021-06-20 ENCOUNTER — Other Ambulatory Visit: Payer: Self-pay | Admitting: Student

## 2021-07-09 ENCOUNTER — Encounter (HOSPITAL_COMMUNITY)
Admission: RE | Admit: 2021-07-09 | Discharge: 2021-07-09 | Disposition: A | Discharge status: Home / Self Care | Payer: Self-pay | Source: Ambulatory Visit | Attending: Cardiology | Admitting: Cardiology

## 2021-07-09 NOTE — Progress Notes (Signed)
Cardiac Rehab: Virtual Visit  Patient participates in the virtual cardiac rehab program via. Called patient to check progress with exercise and to schedule exit walk test and measurements. Patient is walking but hasn't kept track of duration during the holidays. Patient states that he feels less tired but still needs to build strength. He still has trouble with heavy lifting and feeling comfortable lifting anything heavier than a bag of dog food. Patient's job involves a lot of walking , which he's able to tolerate. Overall, he feels that some days are better than others, but he is making progress.  Patient scheduled for post walk test and measurements on Tuesday 08/12/2021 at 0930.  Artist Pais, MS, ACSM CEP 07/09/2021 1530

## 2021-07-18 ENCOUNTER — Other Ambulatory Visit: Payer: Self-pay | Admitting: Student

## 2021-07-31 ENCOUNTER — Ambulatory Visit: Payer: Self-pay | Admitting: *Deleted

## 2021-07-31 NOTE — Telephone Encounter (Signed)
Summary: cough / rx request   The patient has experienced significant congestion for past week   The patient shares that the congestion has moved from their sinus/nasal area to their chest   The patient has experienced congestion and discomfort since Saturday 07/26/21   The patient would like to be prescribed something to help with their discomfort   Please contact further      Reason for Disposition  [1] MILD difficulty breathing (e.g., minimal/no SOB at rest, SOB with walking, pulse <100) AND [2] still present when not coughing  Answer Assessment - Initial Assessment Questions 1. ONSET: "When did the cough begin?"      Saturday am 2. SEVERITY: "How bad is the cough today?"      Productive at night 3. SPUTUM: "Describe the color of your sputum" (none, dry cough; clear, white, yellow, green)     clear 4. HEMOPTYSIS: "Are you coughing up any blood?" If so ask: "How much?" (flecks, streaks, tablespoons, etc.)     no 5. DIFFICULTY BREATHING: "Are you having difficulty breathing?" If Yes, ask: "How bad is it?" (e.g., mild, moderate, severe)    - MILD: No SOB at rest, mild SOB with walking, speaks normally in sentences, can lie down, no retractions, pulse < 100.    - MODERATE: SOB at rest, SOB with minimal exertion and prefers to sit, cannot lie down flat, speaks in phrases, mild retractions, audible wheezing, pulse 100-120.    - SEVERE: Very SOB at rest, speaks in single words, struggling to breathe, sitting hunched forward, retractions, pulse > 120      mild 6. FEVER: "Do you have a fever?" If Yes, ask: "What is your temperature, how was it measured, and when did it start?"     no 7. CARDIAC HISTORY: "Do you have any history of heart disease?" (e.g., heart attack, congestive heart failure)      Heart disease 8. LUNG HISTORY: "Do you have any history of lung disease?"  (e.g., pulmonary embolus, asthma, emphysema)     Yes- not sure diagnosis- COPD, asthma 9. PE RISK FACTORS: "Do you have  a history of blood clots?" (or: recent major surgery, recent prolonged travel, bedridden)     *No Answer* 10. OTHER SYMPTOMS: "Do you have any other symptoms?" (e.g., runny nose, wheezing, chest pain)       Chest congestion 11. PREGNANCY: "Is there any chance you are pregnant?" "When was your last menstrual period?"       *No Answer* 12. TRAVEL: "Have you traveled out of the country in the last month?" (e.g., travel history, exposures)       *No Answer*  Protocols used: Cough - Acute Productive-A-AH

## 2021-07-31 NOTE — Telephone Encounter (Signed)
°  Chief Complaint: cough, chest congestion Symptoms: cough, chest congestion Frequency: Saturday Pertinent Negatives: Patient denies fever Disposition: [] ED /[x] Urgent Care (no appt availability in office) / [] Appointment(In office/virtual)/ []  [place] [facility]/ [] Home Care/ [] Refused Recommended Disposition /[] [place] [facility]/ []  Follow-up with PCP Additional Notes: No PCP listed - advised UC

## 2021-08-12 ENCOUNTER — Other Ambulatory Visit: Payer: Self-pay

## 2021-08-12 ENCOUNTER — Encounter (HOSPITAL_COMMUNITY)
Admission: RE | Admit: 2021-08-12 | Discharge: 2021-08-12 | Disposition: A | Discharge status: Home / Self Care | Payer: Self-pay | Source: Ambulatory Visit | Attending: Cardiology | Admitting: Cardiology

## 2021-08-12 VITALS — BP 128/82 | HR 80 | Ht 71.25 in | Wt 213.4 lb

## 2021-08-12 DIAGNOSIS — Z951 Presence of aortocoronary bypass graft: Secondary | ICD-10-CM | POA: Insufficient documentation

## 2021-08-12 NOTE — Progress Notes (Signed)
Gerald Andrade has completed the virtual cardiac rehab program and will continue exercise independently at this time. Patient's virtual cardiac rehab report will be scanned to media for review.   Sol Passer, MS, ACSM CEP

## 2021-08-15 ENCOUNTER — Encounter: Payer: Self-pay | Admitting: Student

## 2021-08-15 ENCOUNTER — Ambulatory Visit: Payer: 59 | Admitting: Student

## 2021-08-15 ENCOUNTER — Other Ambulatory Visit: Payer: Self-pay

## 2021-08-15 VITALS — BP 126/86 | HR 76 | Temp 98.1°F | Ht 71.0 in | Wt 213.0 lb

## 2021-08-15 DIAGNOSIS — E7800 Pure hypercholesterolemia, unspecified: Secondary | ICD-10-CM

## 2021-08-15 DIAGNOSIS — I25118 Atherosclerotic heart disease of native coronary artery with other forms of angina pectoris: Secondary | ICD-10-CM

## 2021-08-15 DIAGNOSIS — Z951 Presence of aortocoronary bypass graft: Secondary | ICD-10-CM

## 2021-08-15 MED ORDER — METOPROLOL TARTRATE 50 MG PO TABS: 50.0000 mg | ORAL_TABLET | Freq: Two times a day (BID) | ORAL | 3 refills | Status: DC

## 2021-08-15 NOTE — Progress Notes (Signed)
Primary Physician/Referring:  Pcp, No  Patient ID: Gerald Andrade, male    DOB: 1970-07-26, 51 y.o.   MRN: [id number]  Chief Complaint  Patient presents with   Hypertension   Coronary Artery Disease   Follow-up   HPI:    Gerald Andrade  is a 51 y.o. Caucasian male  with history of remote tobacco use, obesity, strong family history of premature coronary disease, diabetes mellitus, CAD status post CABG x4 in May 2022.    He was admitted 11/2020 with unstable angina, cardiac catheterization revealed severe triple-vessel disease and he subsequently underwent CABG x4 on 11/14/2020.  Patient had postoperative atrial fibrillation and was therefore started on Eliquis and amiodarone.  Patient had no recurrence of atrial fibrillation, therefore amiodarone was discontinued in 12/2020.  Patient presents for 64-month follow-up of CAD.  At last office visit patient had had no known recurrence of atrial fibrillation, therefore shared decision was to stop Eliquis and continue with aspirin and Plavix.  Also at last office visit patient was following closely with CT surgery for management of pleural effusion which had improved for last visit and he was advised for as needed follow-up.  Patient was admitted 04/10/2021 - 04/16/2021 at which time he underwent cholecystectomy.   Patient's primary concern today is generalized fatigue.  He has no formal exercise routine but does state he is active in his workshop.  Denies myalgias, chest pain, dyspnea.  He recently obtained health insurance, however he is potentially in the process of switching PCPs and was unable to get previously ordered lab work done as it was cost prohibitive.  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   LIMA to LAD, Free RIMA to PDA, LRA to OM2, SVG to Diag   Type II diabetes mellitus (HCC) 11/13/2020   Past Surgical History:   Procedure Laterality Date   CARDIAC CATHETERIZATION     CHOLECYSTECTOMY N/A 04/15/2021   Procedure: LAPAROSCOPIC CHOLECYSTECTOMY WITH INTRAOPERATIVE CHOLANGIOGRAM;  Surgeon: Romie Levee, MD;  Location: [facility] ORS;  Service: General;  Laterality: N/A;   CORONARY ARTERY BYPASS GRAFT N/A 11/14/2020   Procedure: CORONARY ARTERY BYPASS GRAFTING (CABG), ON PUMP, TIMES FOUR, USING BILATERAL INTERNAL MAMMARY ARTERIES, LEFT RADIAL ARTERY HARVESTED OPEN, AND RIGHT ENDOSCOPICALLY HARVESTED GREATER SAPHENOUS VEIN;  Surgeon: Purcell Nails, MD;  Location: [facility] OR;  Service: Open Heart Surgery;  Laterality: N/A;   ENDOVEIN HARVEST OF GREATER SAPHENOUS VEIN Right 11/14/2020   Procedure: ENDOVEIN HARVEST OF GREATER SAPHENOUS VEIN;  Surgeon: Purcell Nails, MD;  Location: [facility] OR;  Service: Open Heart Surgery;  Laterality: Right;   IR THORACENTESIS ASP PLEURAL SPACE W/IMG GUIDE  02/27/2021   LEFT HEART CATH AND CORONARY ANGIOGRAPHY N/A 11/12/2020   Procedure: LEFT HEART CATH AND CORONARY ANGIOGRAPHY;  Surgeon: Yates Decamp, MD;  Location: [facility] INVASIVE CV LAB;  Service: Cardiovascular;  Laterality: N/A;   RADIAL ARTERY HARVEST Left 11/14/2020   Procedure: RADIAL ARTERY HARVEST;  Surgeon: Purcell Nails, MD;  Location: [facility] OR;  Service: Open Heart Surgery;  Laterality: Left;   TEE WITHOUT CARDIOVERSION N/A 11/14/2020   Procedure: TRANSESOPHAGEAL ECHOCARDIOGRAM (TEE);  Surgeon: Purcell Nails, MD;  Location: [facility] OR;  Service: Open Heart Surgery;  Laterality: N/A;   Family History  Problem Relation Age of Onset   CAD Mother    CAD Father  Social History   Tobacco Use   Smoking status: Former    Packs/day: 2.00    Years: 10.00    Pack years: 20.00    Types: Cigarettes    Quit date: 12/11/2000    Years since quitting: 20.6   Smokeless tobacco: Never  Substance Use Topics   Alcohol use: No   Marital Status: Married  ROS  Review of Systems  Constitutional: Positive for malaise/fatigue.  Cardiovascular:   Negative for chest pain, claudication, dyspnea on exertion, leg swelling, near-syncope, orthopnea, palpitations, paroxysmal nocturnal dyspnea and syncope.  Respiratory:  Negative for shortness of breath.   Neurological:  Negative for dizziness.  Objective  Blood pressure 126/86, pulse 76, temperature 98.1 F (36.7 C), temperature source Temporal, height 5\' 11"  (1.803 m), weight 213 lb (96.6 kg), SpO2 97 %. Body mass index is 29.71 kg/m.  Vitals with BMI 08/15/2021 08/12/2021 04/16/2021  Height 5\' 11"  5' 11.25" -  Weight 213 lbs 213 lbs 6 oz -  BMI 99991111 0000000 -  Systolic [id number] 0000000 [id number]  Diastolic 86 82 78  Pulse 76 80 76     Physical Exam Vitals reviewed.  Neck:     Vascular: No carotid bruit or JVD.  Cardiovascular:     Rate and Rhythm: Normal rate and regular rhythm.     Pulses: Intact distal pulses.     Heart sounds: Normal heart sounds. No murmur heard.   No gallop.  Pulmonary:     Effort: Pulmonary effort is normal.     Breath sounds: Normal breath sounds.  Chest:     Comments: Thoracotomy scar has healed well Musculoskeletal:     Right lower leg: No edema.     Left lower leg: No edema.  Neurological:     Mental Status: He is alert.     Laboratory examination:   Recent Labs    04/14/21 0442 04/15/21 0501 04/16/21 0500  NA 134* 135 132*  K 4.6 3.9 4.3  CL 100 99 98  CO2 26 27 25   GLUCOSE 98 101* 143*  BUN 12 10 11   CREATININE 0.81 0.70 0.98  CALCIUM 8.6* 8.8* 8.4*  GFRNONAA >60 >60 >60   CrCl cannot be calculated (Patient's most recent lab result is older than the maximum 21 days allowed.).  CMP Latest Ref Rng & Units 04/16/2021 04/15/2021 04/14/2021  Glucose 70 - 99 mg/dL 143(H) 101(H) 98  BUN 6 - 20 mg/dL 11 10 12   Creatinine 0.61 - 1.24 mg/dL 0.98 0.70 0.81  Sodium 135 - 145 mmol/L 132(L) 135 134(L)  Potassium 3.5 - 5.1 mmol/L 4.3 3.9 4.6  Chloride 98 - 111 mmol/L 98 99 100  CO2 22 - 32 mmol/L 25 27 26   Calcium 8.9 - 10.3 mg/dL 8.4(L) 8.8(L) 8.6(L)  Total  Protein 6.5 - 8.1 g/dL - - 7.5  Total Bilirubin 0.3 - 1.2 mg/dL - - 2.6(H)  Alkaline Phos 38 - 126 U/L - - 131(H)  AST 15 - 41 U/L - - 19  ALT 0 - 44 U/L - - 25   CBC Latest Ref Rng & Units 04/16/2021 04/15/2021 04/14/2021  WBC 4.0 - 10.5 K/uL 13.3(H) 7.3 8.3  Hemoglobin 13.0 - 17.0 g/dL 12.6(L) 13.0 13.1  Hematocrit 39.0 - 52.0 % 38.5(L) 39.4 40.0  Platelets 150 - 400 K/uL 235 235 223    Lipid Panel Recent Labs    11/12/20 1501  CHOL 181  TRIG 100  LDLCALC 127*  VLDL 20  HDL 34*  CHOLHDL  5.3   Lipid Panel     Component Value Date/Time   CHOL 181 11/12/2020 1501   TRIG 100 11/12/2020 1501   HDL 34 (L) 11/12/2020 1501   CHOLHDL 5.3 11/12/2020 1501   VLDL 20 11/12/2020 1501   LDLCALC 127 (H) 11/12/2020 1501     HEMOGLOBIN A1C Lab Results  Component Value Date   HGBA1C 6.2 (H) 04/11/2021   MPG 131.24 04/11/2021   TSH Recent Labs    11/13/20 0611 11/19/20 1812  TSH 0.651 1.479    Allergies   Allergies  Allergen Reactions   Shrimp [Shellfish Allergy] Swelling    Chest tighten     Medication prior to this encounter:   Outpatient Medications Prior to Visit  Medication Sig Dispense Refill   aspirin EC 81 MG EC tablet Take 1 tablet (81 mg total) by mouth daily. Swallow whole. 30 tablet 11   atorvastatin (LIPITOR) 80 MG tablet Take 1 tablet (80 mg total) by mouth daily. 90 tablet 1   clopidogrel (PLAVIX) 75 MG tablet Take 1 tablet (75 mg total) by mouth daily. 90 tablet 3   glipiZIDE (GLUCOTROL) 10 MG tablet TAKE 1 TABLET (10 MG TOTAL) BY MOUTH 2 (TWO) TIMES DAILY BEFORE A MEAL. 180 tablet 1   metoprolol tartrate (LOPRESSOR) 50 MG tablet TAKE 1 TABLET BY MOUTH TWICE A DAY 180 tablet 0   diazepam (VALIUM) 5 MG tablet Take 2 tablets (10 mg total) by mouth at bedtime as needed for anxiety. (Patient not taking: Reported on 08/15/2021) 30 tablet 0   No facility-administered medications prior to visit.     FINAL MEDICATION AS OF TODAY:   Medications after current  encounter Current Outpatient Medications  Medication Instructions   aspirin 81 mg, Oral, Daily, Swallow whole.   atorvastatin (LIPITOR) 80 mg, Oral, Daily   clopidogrel (PLAVIX) 75 mg, Oral, Daily   glipiZIDE (GLUCOTROL) 10 mg, Oral, 2 times daily before meals   metoprolol tartrate (LOPRESSOR) 50 mg, Oral, 2 times daily    Radiology:   No results found.  Cardiac Studies:   Left Heart Catheterization 11/12/2020:  LV: Mild LV systolic dysfunction, EF 45% with inferior akinesis.  No significant mitral regurgitation.  Normal EDP.  No pressure gradient across the aortic valve. LM: Large-caliber vessel, normal.  Short. LAD: Large vessel in the proximal segment.  Proximal LAD has an ulcerated lesion.  There is origin of a very large D1 at the proximal segment and a large septal perforator at which point the LAD is occluded and has faint ipsilateral collaterals from the circumflex, diagonal and the SP1, and after reconstitution of the LAD, there is a focal 80% stenosis.  The diagonal 1 is very large and has proximal high-grade 80 to 90% stenosis. CX: Large vessel, giving origin to small OM1 after which it is a CTO with bridging collaterals.  Distal circumflex also receives collaterals from the LAD.  Circumflex also gives collaterals to occluded RCA. RCA: It is occluded in the midsegment after the origin of RV branch.  Distal RCA including PL and PDA branches are large and are collateralized from circumflex and LAD.   Recommendation: Patient has complex multivessel disease and would benefit from CABG with arterial conduits if possible including LIMA to LAD and diagonal and RIMA to RCA and probably a free radial graft to the circumflex.  Surgical consultation has been requested.  Patient needs to be admitted inpatient in view of complex coronary anatomy and high risk for cardiac events.  Pre-CABG vascular studies 11/13/2020: Carotid artery duplex and ABI revealed no abnormality with triphasic  waveforms at the ankles.  Echocardiogram 11/12/2020:  1. Left ventricular ejection fraction, by estimation, is 55 to 60%. The left ventricle has normal function. The left ventricle has no regional wall motion abnormalities. Left ventricular diastolic parameters were normal.  2. Right ventricular systolic function is normal. The right ventricular size is normal.  3. The mitral valve is grossly normal. No evidence of mitral valve regurgitation.  4. The aortic valve is tricuspid. Aortic valve regurgitation is not visualized.  CORONARY ARTERY BYPASS GRAFTING x 4 11/13/2021: -Free RIMA to PDA -Left Radial Artery to OM 2 -SVG to DIAGONAL -LIMA to LAD  EKG:  08/15/2021: Sinus rhythm at a rate of 69 bpm.  Left atrial enlargement.  Normal axis.  No evidence of ischemia or underlying injury pattern.  Compared EKG 02/13/2021, no significant change.  02/13/2021: Normal sinus rhythm at rate of 84 bpm, normal axis. Left atrial enlargement.  No evidence of ischemia, normal EKG.  EKG 12/25/2020: Normal sinus rhythm at rate of 83 bpm, normal axis.  No evidence of ischemia, normal EKG.  Assessment     ICD-10-CM   1. Coronary artery disease of native artery of native heart with stable angina pectoris ([place])  I25.118 EKG 12-Lead    2. S/P CABG x 4  Z95.1 EKG 12-Lead    3. Hypercholesteremia  E78.00        Medications Discontinued During This Encounter  Medication Reason   diazepam (VALIUM) 5 MG tablet Patient has not taken in last 30 days   metoprolol tartrate (LOPRESSOR) 50 MG tablet Reorder    Meds ordered this encounter  Medications   metoprolol tartrate (LOPRESSOR) 50 MG tablet    Sig: Take 1 tablet (50 mg total) by mouth 2 (two) times daily.    Dispense:  180 tablet    Refill:  3   Orders Placed This Encounter  Procedures   EKG 12-Lead   Recommendations:   Gerald Andrade is a 51 y.o. Caucasian male  with history of remote tobacco use, obesity, strong family history of premature coronary  disease, diabetes mellitus, CAD status post CABG x4 in May 2022.    He was admitted 11/2020 with unstable angina, cardiac catheterization revealed severe triple-vessel disease and he subsequently underwent CABG x4 on 11/14/2020.  Patient had postoperative atrial fibrillation and was therefore started on Eliquis and amiodarone.  Patient had no recurrence of atrial fibrillation, therefore amiodarone was discontinued in 12/2020.  Patient presents for 45-month follow-up of CAD.  At last office visit patient had had no known recurrence of atrial fibrillation, therefore shared decision was to stop Eliquis and continue with aspirin and Plavix.  Also at last office visit patient was following closely with CT surgery for management of pleural effusion which had improved for last visit and he was advised for as needed follow-up.  Patient was admitted 04/10/2021 - 04/16/2021 at which time he underwent cholecystectomy.  Physical exam and EKG remained stable.   Patient is doing well overall, however he does complain of fatigue which is likely multifactorial, but primarily likely secondary to deconditioning.  Advised patient to increase physical activity and continue to monitor for symptoms, he will notify our office if fatigue worsens or fails to improve.  We will continue aspirin and Plavix as he is tolerating dual antiplatelet therapy without bleeding diathesis.  Patient has had no known recurrence of pleural effusion and denies dyspnea.  There  is no clinical evidence of heart failure at this time.  He continues to maintain sinus rhythm.  Patient is due for labs, however as he is transitioning PCPs And labs were previously cost prohibitive he will notify our office of where to send the lab orders including lipid profile testing in the next couple of weeks.  Follow-up in 6 months, sooner if needed, for CAD.   Alethia Berthold, PA-C 08/15/2021, 1:43 PM Office: [phone]

## 2021-09-09 ENCOUNTER — Other Ambulatory Visit: Payer: Self-pay | Admitting: Student

## 2021-09-09 DIAGNOSIS — I25118 Atherosclerotic heart disease of native coronary artery with other forms of angina pectoris: Secondary | ICD-10-CM

## 2021-10-04 ENCOUNTER — Other Ambulatory Visit: Payer: Self-pay | Admitting: Cardiology

## 2021-10-04 DIAGNOSIS — E119 Type 2 diabetes mellitus without complications: Secondary | ICD-10-CM

## 2022-02-04 ENCOUNTER — Encounter: Payer: Self-pay | Admitting: Student

## 2022-02-04 ENCOUNTER — Ambulatory Visit: Payer: 59 | Admitting: Student

## 2022-02-04 VITALS — BP 139/71 | HR 64 | Resp 16 | Ht 71.0 in | Wt 220.0 lb

## 2022-02-04 DIAGNOSIS — Z951 Presence of aortocoronary bypass graft: Secondary | ICD-10-CM

## 2022-02-04 DIAGNOSIS — I25118 Atherosclerotic heart disease of native coronary artery with other forms of angina pectoris: Secondary | ICD-10-CM

## 2022-02-04 DIAGNOSIS — E7800 Pure hypercholesterolemia, unspecified: Secondary | ICD-10-CM

## 2022-02-04 MED ORDER — ROSUVASTATIN CALCIUM 20 MG PO TABS: 20.0000 mg | ORAL_TABLET | Freq: Every day | ORAL | 3 refills | Status: DC

## 2022-02-04 NOTE — Progress Notes (Signed)
Primary Physician/Referring:  Pcp, No  Patient ID: Gerald Andrade, male    DOB: 14-May-1971, 51 y.o.   MRN: [id number]  Chief Complaint  Patient presents with   Coronary Artery Disease   Follow-up   HPI:    Gerald Andrade  is a 51 y.o. Caucasian male  with history of remote tobacco use, obesity, strong family history of premature coronary disease, diabetes mellitus, CAD status post CABG x4 in May 2022.    He was admitted 11/2020 with unstable angina, cardiac catheterization revealed severe triple-vessel disease and he subsequently underwent CABG x4 on 11/14/2020.  Patient had postoperative atrial fibrillation and was therefore started on Eliquis and amiodarone.  Patient had no recurrence of atrial fibrillation, therefore amiodarone was discontinued in 12/2020.  Patient was again admitted 04/10/2021 - 04/16/2021 during which time he underwent cholecystectomy.  Patient was last seen in the office 08/15/2021 at which time he was stable from a cardiovascular standpoint and no changes were made. He now presents for 6 month follow up.  Patient is feeling well overall without chest pain, dyspnea, dizziness, syncope, near syncope.  However he admits that approximately 5 months ago he independently discontinued statin therapy due to brain fog and muscle pains.  He reports medication compliance otherwise.  He also has not established with PCP and has not had labs done since last office visit.  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   LIMA to LAD, Free RIMA to PDA, LRA to OM2, SVG to Diag   Type II diabetes mellitus (HCC) 11/13/2020   Past Surgical History:  Procedure Laterality Date   CARDIAC CATHETERIZATION     CHOLECYSTECTOMY N/A 04/15/2021   Procedure: LAPAROSCOPIC CHOLECYSTECTOMY WITH INTRAOPERATIVE CHOLANGIOGRAM;  Surgeon: Romie Levee, MD;  Location: [facility];  Service: General;   Laterality: N/A;   CORONARY ARTERY BYPASS GRAFT N/A 11/14/2020   Procedure: CORONARY ARTERY BYPASS GRAFTING (CABG), ON PUMP, TIMES FOUR, USING BILATERAL INTERNAL MAMMARY ARTERIES, LEFT RADIAL ARTERY HARVESTED OPEN, AND RIGHT ENDOSCOPICALLY HARVESTED GREATER SAPHENOUS VEIN;  Surgeon: Purcell Nails, MD;  Location: [facility] OR;  Service: Open Heart Surgery;  Laterality: N/A;   ENDOVEIN HARVEST OF GREATER SAPHENOUS VEIN Right 11/14/2020   Procedure: ENDOVEIN HARVEST OF GREATER SAPHENOUS VEIN;  Surgeon: Purcell Nails, MD;  Location: [facility] OR;  Service: Open Heart Surgery;  Laterality: Right;   IR THORACENTESIS ASP PLEURAL SPACE W/IMG GUIDE  02/27/2021   LEFT HEART CATH AND CORONARY ANGIOGRAPHY N/A 11/12/2020   Procedure: LEFT HEART CATH AND CORONARY ANGIOGRAPHY;  Surgeon: Yates Decamp, MD;  Location: [facility];  Service: Cardiovascular;  Laterality: N/A;   RADIAL ARTERY HARVEST Left 11/14/2020   Procedure: RADIAL ARTERY HARVEST;  Surgeon: Purcell Nails, MD;  Location: [facility] OR;  Service: Open Heart Surgery;  Laterality: Left;   TEE WITHOUT CARDIOVERSION N/A 11/14/2020   Procedure: TRANSESOPHAGEAL ECHOCARDIOGRAM (TEE);  Surgeon: Purcell Nails, MD;  Location: [facility] OR;  Service: Open Heart Surgery;  Laterality: N/A;   Family History  Problem Relation Age of Onset   CAD Mother    CAD Father     Social History   Tobacco Use   Smoking status: Former    Packs/day: 2.00    Years: 10.00    Total pack years: 20.00    Types: Cigarettes    Quit  date: 12/11/2000    Years since quitting: 21.1   Smokeless tobacco: Never  Substance Use Topics   Alcohol use: No   Marital Status: Married  ROS  Review of Systems  Constitutional: Negative for malaise/fatigue.  Cardiovascular:  Negative for chest pain, claudication, dyspnea on exertion, leg swelling, near-syncope, orthopnea, palpitations, paroxysmal nocturnal dyspnea and syncope.  Respiratory:  Negative for shortness of breath.   Neurological:   Negative for dizziness.   Objective  Blood pressure 139/71, pulse 64, resp. rate 16, height 5\' 11"  (1.803 m), weight 220 lb (99.8 kg), SpO2 96 %. Body mass index is 30.68 kg/m.     02/04/2022    9:58 AM 08/15/2021    1:06 PM 08/12/2021   10:50 AM  Vitals with BMI  Height 5\' 11"  5\' 11"  5' 11.25"  Weight 220 lbs 213 lbs 213 lbs 6 oz  BMI 30.7 29.72 29.55  Systolic 139 126 08/14/2021  Diastolic 71 86 82  Pulse 64 76 80     Physical Exam Vitals reviewed.  Neck:     Vascular: No carotid bruit or JVD.  Cardiovascular:     Rate and Rhythm: Normal rate and regular rhythm.     Pulses: Intact distal pulses.     Heart sounds: Normal heart sounds. No murmur heard.    No gallop.  Pulmonary:     Effort: Pulmonary effort is normal.     Breath sounds: Normal breath sounds.  Chest:     Comments: Thoracotomy scar has healed well Musculoskeletal:     Right lower leg: No edema.     Left lower leg: No edema.  Neurological:     Mental Status: He is alert.   Physical exam unchanged compared to previous office visit  Laboratory examination:   Recent Labs    04/14/21 0442 04/15/21 0501 04/16/21 0500  NA 134* 135 132*  K 4.6 3.9 4.3  CL 100 99 98  CO2 26 27 25   GLUCOSE 98 101* 143*  BUN 12 10 11   CREATININE 0.81 0.70 0.98  CALCIUM 8.6* 8.8* 8.4*  GFRNONAA >60 >60 >60   CrCl cannot be calculated (Patient's most recent lab result is older than the maximum 21 days allowed.).     Latest Ref Rng & Units 04/16/2021    5:00 AM 04/15/2021    5:01 AM 04/14/2021    4:42 AM  CMP  Glucose 70 - 99 mg/dL   98   BUN 6 - 20 mg/dL 11  10  12    Creatinine 0.61 - 1.24 mg/dL 06/16/2021  06/15/2021  06/14/2021   Sodium 135 - 145 mmol/L 132  135  134   Potassium 3.5 - 5.1 mmol/L 4.3  3.9  4.6   Chloride 98 - 111 mmol/L 98  99  100   CO2 22 - 32 mmol/L 25  27  26    Calcium 8.9 - 10.3 mg/dL 8.4  8.8  8.6   Total Protein 6.5 - 8.1 g/dL   7.5   Total Bilirubin 0.3 - 1.2 mg/dL   2.6   Alkaline Phos 38 - 126 U/L   131    AST 15 - 41 U/L   19   ALT 0 - 44 U/L   25       Latest Ref Rng & Units 04/16/2021    5:00 AM 04/15/2021    5:01 AM 04/14/2021    4:42 AM  CBC  WBC 4.0 - 10.5 K/uL 13.3  7.3  8.3   Hemoglobin 13.0 - 17.0 g/dL 65.712.6  84.613.0  96.213.1   Hematocrit 39.0 - 52.0 % 38.5  39.4  40.0   Platelets 150 - 400 K/uL 235  235  223     Lipid Panel No results for input(s): "CHOL", "TRIG", "LDLCALC", "VLDL", "HDL", "CHOLHDL", "LDLDIRECT" in the last 8760 hours.  Lipid Panel     Component Value Date/Time   CHOL 181 11/12/2020 1501   TRIG 100 11/12/2020 1501   HDL 34 (L) 11/12/2020 1501   CHOLHDL 5.3 11/12/2020 1501   VLDL 20 11/12/2020 1501   LDLCALC 127 (H) 11/12/2020 1501     HEMOGLOBIN A1C Lab Results  Component Value Date   HGBA1C 6.2 (H) 04/11/2021   MPG 131.24 04/11/2021   TSH No results for input(s): "TSH" in the last 8760 hours.   Allergies   Allergies  Allergen Reactions   Shrimp [Shellfish Allergy] Swelling    Chest tighten     Medication prior to this encounter:   Outpatient Medications Prior to Visit  Medication Sig Dispense Refill   aspirin EC 81 MG EC tablet Take 1 tablet (81 mg total) by mouth daily. Swallow whole. 30 tablet 11   glipiZIDE (GLUCOTROL) 10 MG tablet TAKE 1 TABLET BY MOUTH 2 TIMES DAILY BEFORE A MEAL. 180 tablet 1   metoprolol tartrate (LOPRESSOR) 50 MG tablet Take 1 tablet (50 mg total) by mouth 2 (two) times daily. 180 tablet 3   clopidogrel (PLAVIX) 75 MG tablet Take 1 tablet (75 mg total) by mouth daily. 90 tablet 3   atorvastatin (LIPITOR) 80 MG tablet Take 1 tablet (80 mg total) by mouth daily. 90 tablet 1   No facility-administered medications prior to visit.     FINAL MEDICATION AS OF TODAY:   Medications after current encounter Current Outpatient Medications  Medication Instructions   aspirin EC 81 mg, Oral, Daily, Swallow whole.   glipiZIDE (GLUCOTROL) 10 MG tablet TAKE 1 TABLET BY MOUTH 2 TIMES DAILY BEFORE A MEAL.   metoprolol tartrate  (LOPRESSOR) 50 mg, Oral, 2 times daily   rosuvastatin (CRESTOR) 20 mg, Oral, Daily    Radiology:   No results found.  Cardiac Studies:   Left Heart Catheterization 11/12/2020:  LV: Mild LV systolic dysfunction, EF 45% with inferior akinesis.  No significant mitral regurgitation.  Normal EDP.  No pressure gradient across the aortic valve. LM: Large-caliber vessel, normal.  Short. LAD: Large vessel in the proximal segment.  Proximal LAD has an ulcerated lesion.  There is origin of a very large D1 at the proximal segment and a large septal perforator at which point the LAD is occluded and has faint ipsilateral collaterals from the circumflex, diagonal and the SP1, and after reconstitution of the LAD, there is a focal 80% stenosis.  The diagonal 1 is very large and has proximal high-grade 80 to 90% stenosis. CX: Large vessel, giving origin to small OM1 after which it is a CTO with bridging collaterals.  Distal circumflex also receives collaterals from the LAD.  Circumflex also gives collaterals to occluded RCA. RCA: It is occluded in the midsegment after the origin of RV branch.  Distal RCA including PL and PDA branches are large and are collateralized from circumflex and LAD.   Recommendation: Patient has complex multivessel disease and would benefit from CABG with arterial conduits if possible including LIMA to LAD and diagonal and RIMA to RCA and probably a free radial graft to the circumflex.  Surgical consultation has been  requested.  Patient needs to be admitted inpatient in view of complex coronary anatomy and high risk for cardiac events.      Pre-CABG vascular studies 11/13/2020: Carotid artery duplex and ABI revealed no abnormality with triphasic waveforms at the ankles.  Echocardiogram 11/12/2020:  1. Left ventricular ejection fraction, by estimation, is 55 to 60%. The left ventricle has normal function. The left ventricle has no regional wall motion abnormalities. Left ventricular  diastolic parameters were normal.  2. Right ventricular systolic function is normal. The right ventricular size is normal.  3. The mitral valve is grossly normal. No evidence of mitral valve regurgitation.  4. The aortic valve is tricuspid. Aortic valve regurgitation is not visualized.  CORONARY ARTERY BYPASS GRAFTING x 4 11/13/2021: -Free RIMA to PDA -Left Radial Artery to OM 2 -SVG to DIAGONAL -LIMA to LAD  EKG:  02/04/2022: Sinus rhythm at a rate of 60 bpm.  Normal axis.  Poor R wave progression, cannot exclude anterolateral infarct old.  08/15/2021: Sinus rhythm at a rate of 69 bpm.  Left atrial enlargement.  Normal axis.  No evidence of ischemia or underlying injury pattern.  Compared EKG 02/13/2021, no significant change.  02/13/2021: Normal sinus rhythm at rate of 84 bpm, normal axis. Left atrial enlargement.  No evidence of ischemia, normal EKG.  EKG 12/25/2020: Normal sinus rhythm at rate of 83 bpm, normal axis.  No evidence of ischemia, normal EKG.  Assessment     ICD-10-CM   1. Coronary artery disease of native artery of native heart with stable angina pectoris (HCC)  I25.118 EKG 12-Lead    Basic metabolic panel    Lipid Panel With LDL/HDL Ratio    CBC    TSH    Hgb A1c w/o eAG    2. S/P CABG x 4  Z95.1     3. Hypercholesteremia  E78.00        Medications Discontinued During This Encounter  Medication Reason   atorvastatin (LIPITOR) 80 MG tablet    clopidogrel (PLAVIX) 75 MG tablet Completed Course    Meds ordered this encounter  Medications   rosuvastatin (CRESTOR) 20 MG tablet    Sig: Take 1 tablet (20 mg total) by mouth daily.    Dispense:  90 tablet    Refill:  3   Orders Placed This Encounter  Procedures   Basic metabolic panel   Lipid Panel With LDL/HDL Ratio   CBC   TSH   Hgb A1c w/o eAG   EKG 12-Lead   Recommendations:   Gerald Andrade is a 51 y.o. Caucasian male  with history of remote tobacco use, obesity, strong family history of premature  coronary disease, diabetes mellitus, CAD status post CABG x4 in May 2022.    He was admitted 11/2020 with unstable angina, cardiac catheterization revealed severe triple-vessel disease and he subsequently underwent CABG x4 on 11/14/2020.  Patient had postoperative atrial fibrillation and was therefore started on Eliquis and amiodarone.  Patient had no recurrence of atrial fibrillation, therefore amiodarone was discontinued in 12/2020. Patient was again admitted 04/10/2021 - 04/16/2021 during which time he underwent cholecystectomy.  Patient was last seen in the office 08/15/2021 at which time he was stable from a cardiovascular standpoint and no changes were made. He now presents for 6 month follow up.  Patient has completed 1 year of dual antiplatelet therapy, he may now discontinue Plavix, continue aspirin 81 mg daily.  Counseled patient regarding the importance of medication compliance.  As he has  independently stopped atorvastatin we will restart him on rosuvastatin 20 mg p.o. nightly. Patient will notify our office immediately if he is unable to tolerate this over the next 2 weeks.  We will plan to repeat lipid profile testing in 6 weeks.  If patient is unable to tolerate rosuvastatin or lipids remain uncontrolled would recommend considering Leqvio.   As patient still does not have PCP will also obtain basic labs including BMP, CBC, TSH, and A1c.  Pressure remains well controlled and patient is essentially asymptomatic.  Follow-up in 3 months, sooner if needed.   Rayford Halsted, PA-C 02/04/2022, 12:04 PM Office: [phone]

## 2022-02-10 ENCOUNTER — Other Ambulatory Visit: Payer: Self-pay | Admitting: Student

## 2022-02-12 ENCOUNTER — Ambulatory Visit: Payer: 59 | Admitting: Student

## 2022-04-14 ENCOUNTER — Other Ambulatory Visit: Payer: Self-pay | Admitting: Cardiology

## 2022-04-14 DIAGNOSIS — E119 Type 2 diabetes mellitus without complications: Secondary | ICD-10-CM

## 2022-04-30 IMAGING — DX DG CHEST 2V
2 series · 2 of 2 positions shown · non-contrast
Comparison: 12/10/2020 and CT chest 11/12/2020.

CLINICAL DATA: CABG.

EXAM:
CHEST - 2 VIEW

[dg chest 2 view (1 of 2)]
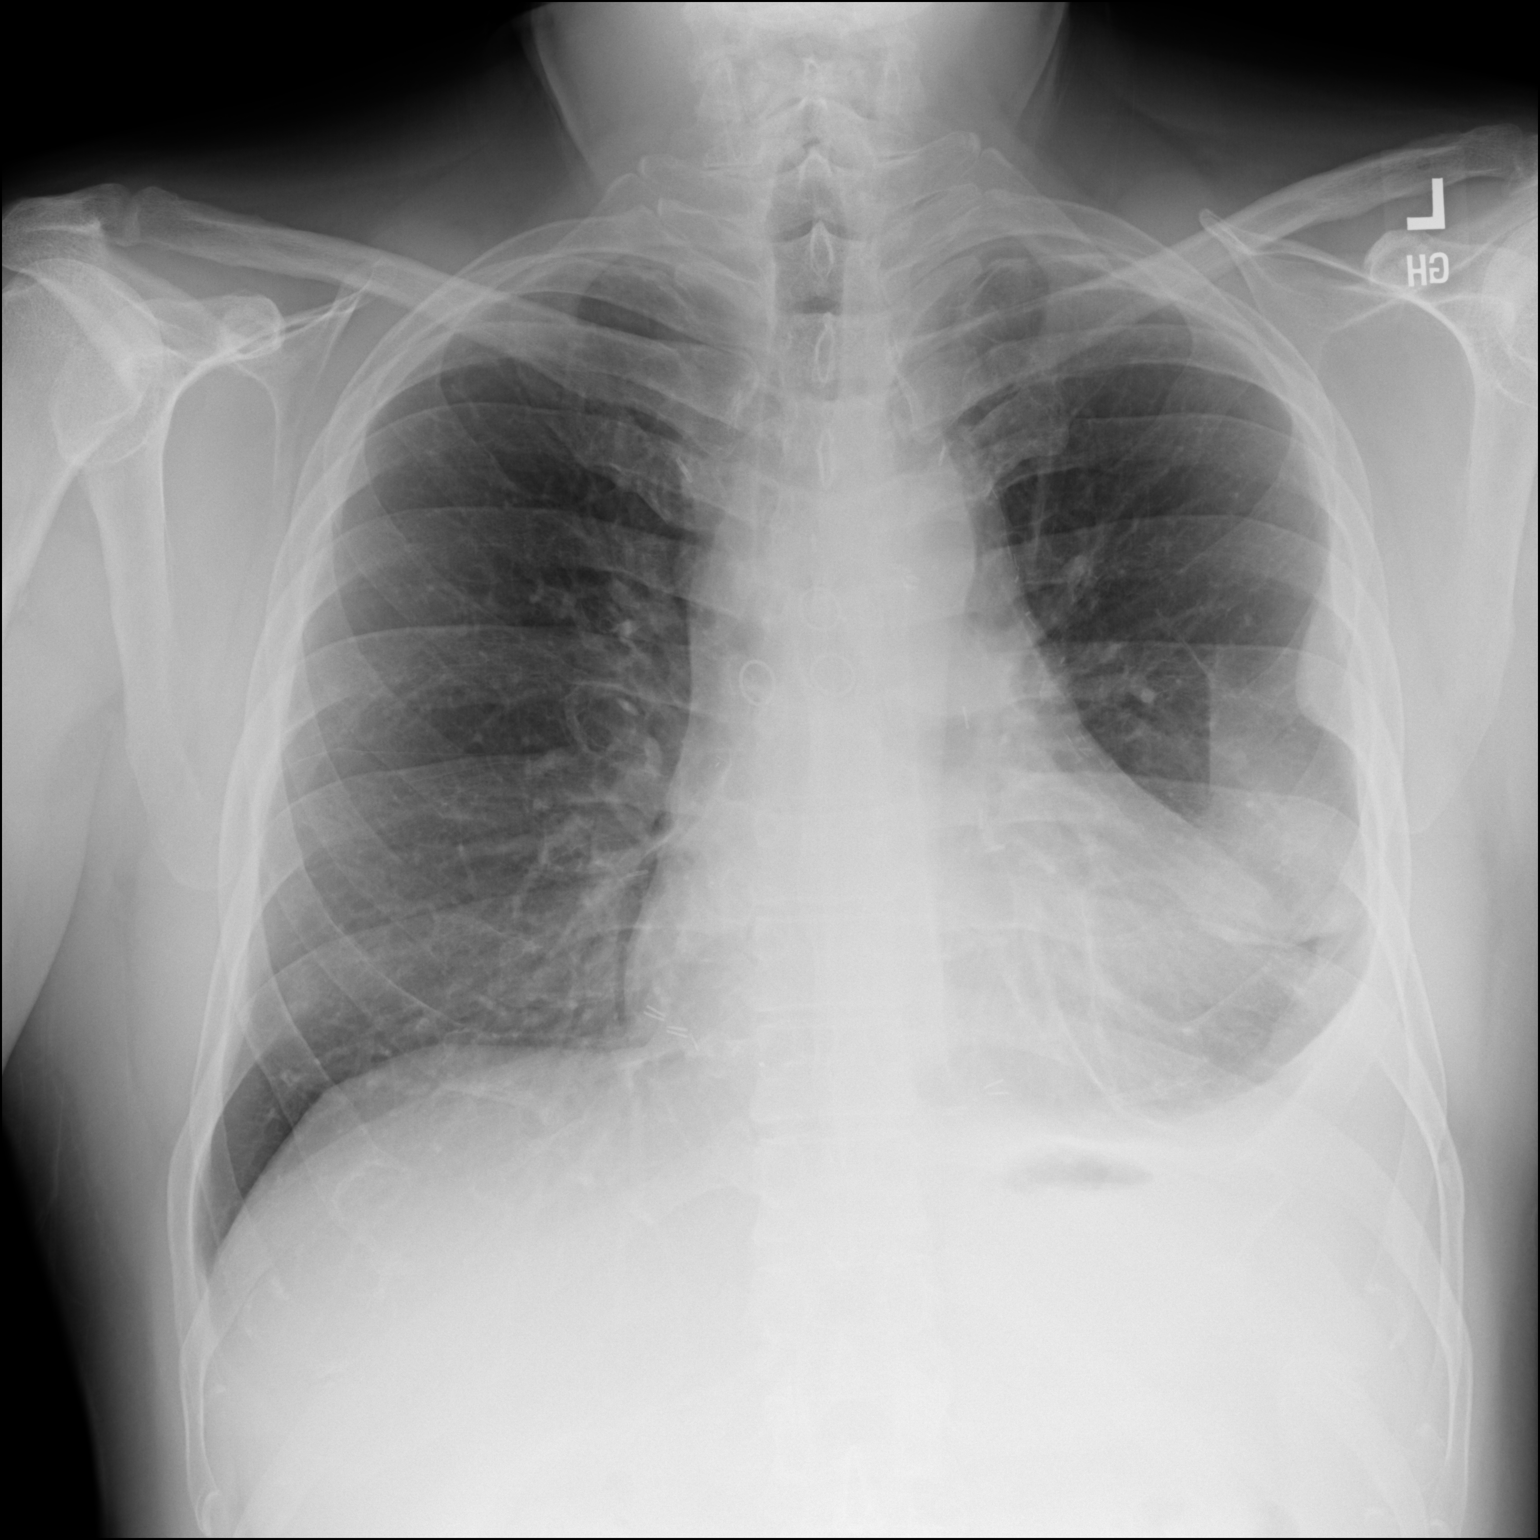

[dg chest 2 view (2 of 2)]
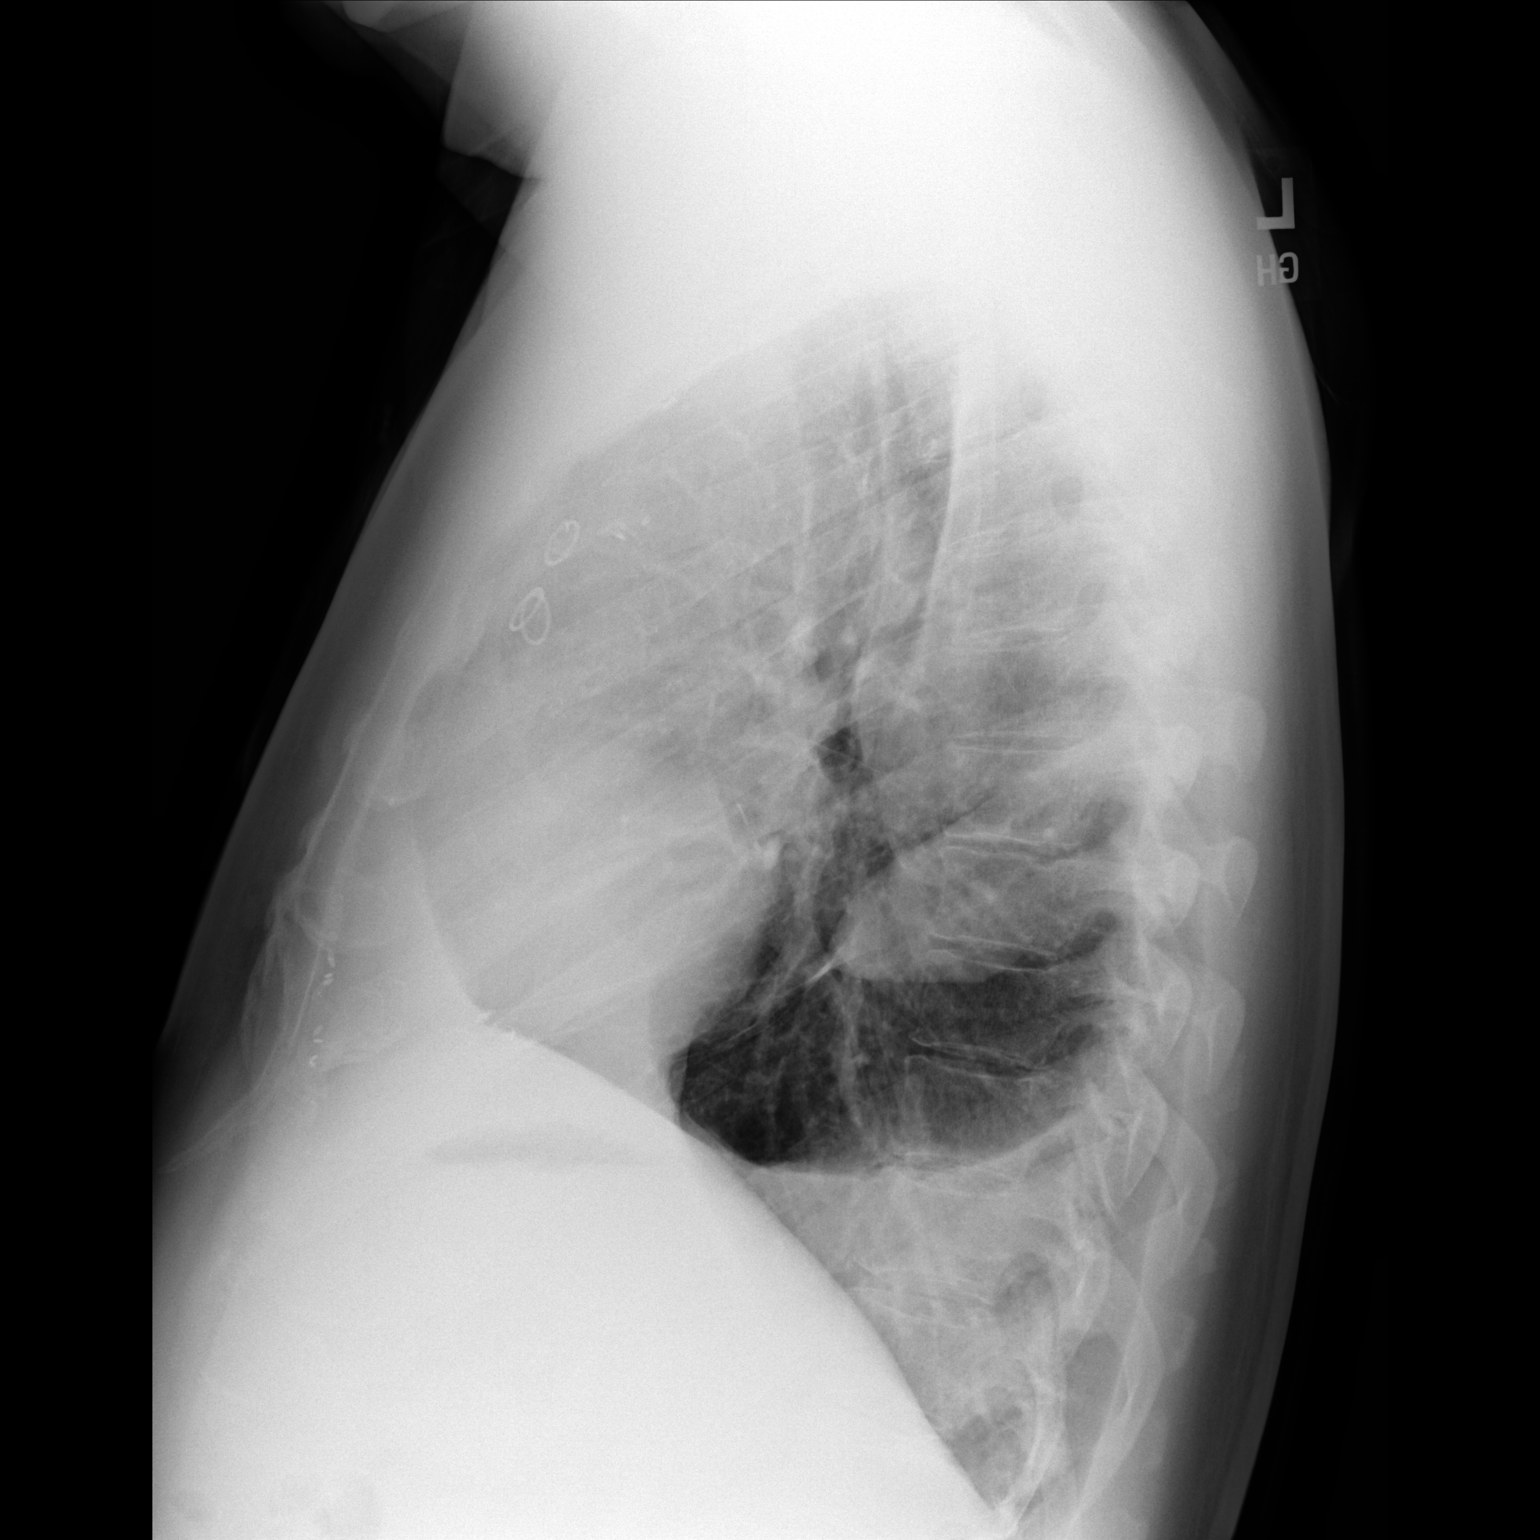

[2 of 2 positions shown; findings below may reference images not displayed]

FINDINGS: Trachea is midline. Heart is enlarged, stable. Small to moderate
left pleural effusion is loculated laterally and inferiorly. Minimal
linear volume loss in the left lung base. Right lung is clear. No
definite pneumothorax.
IMPRESSION: Small to moderate loculated left pleural effusion with minimal left
basilar volume loss.

## 2022-05-07 ENCOUNTER — Ambulatory Visit: Payer: Self-pay | Admitting: Cardiology

## 2022-05-07 IMAGING — DX DG CHEST 2V
2 series · 2 of 2 positions shown · non-contrast
Comparison: February 10, 2021.

CLINICAL DATA: Status post coronary bypass graft.

EXAM:
CHEST - 2 VIEW

[dg chest 2 view (1 of 2)]
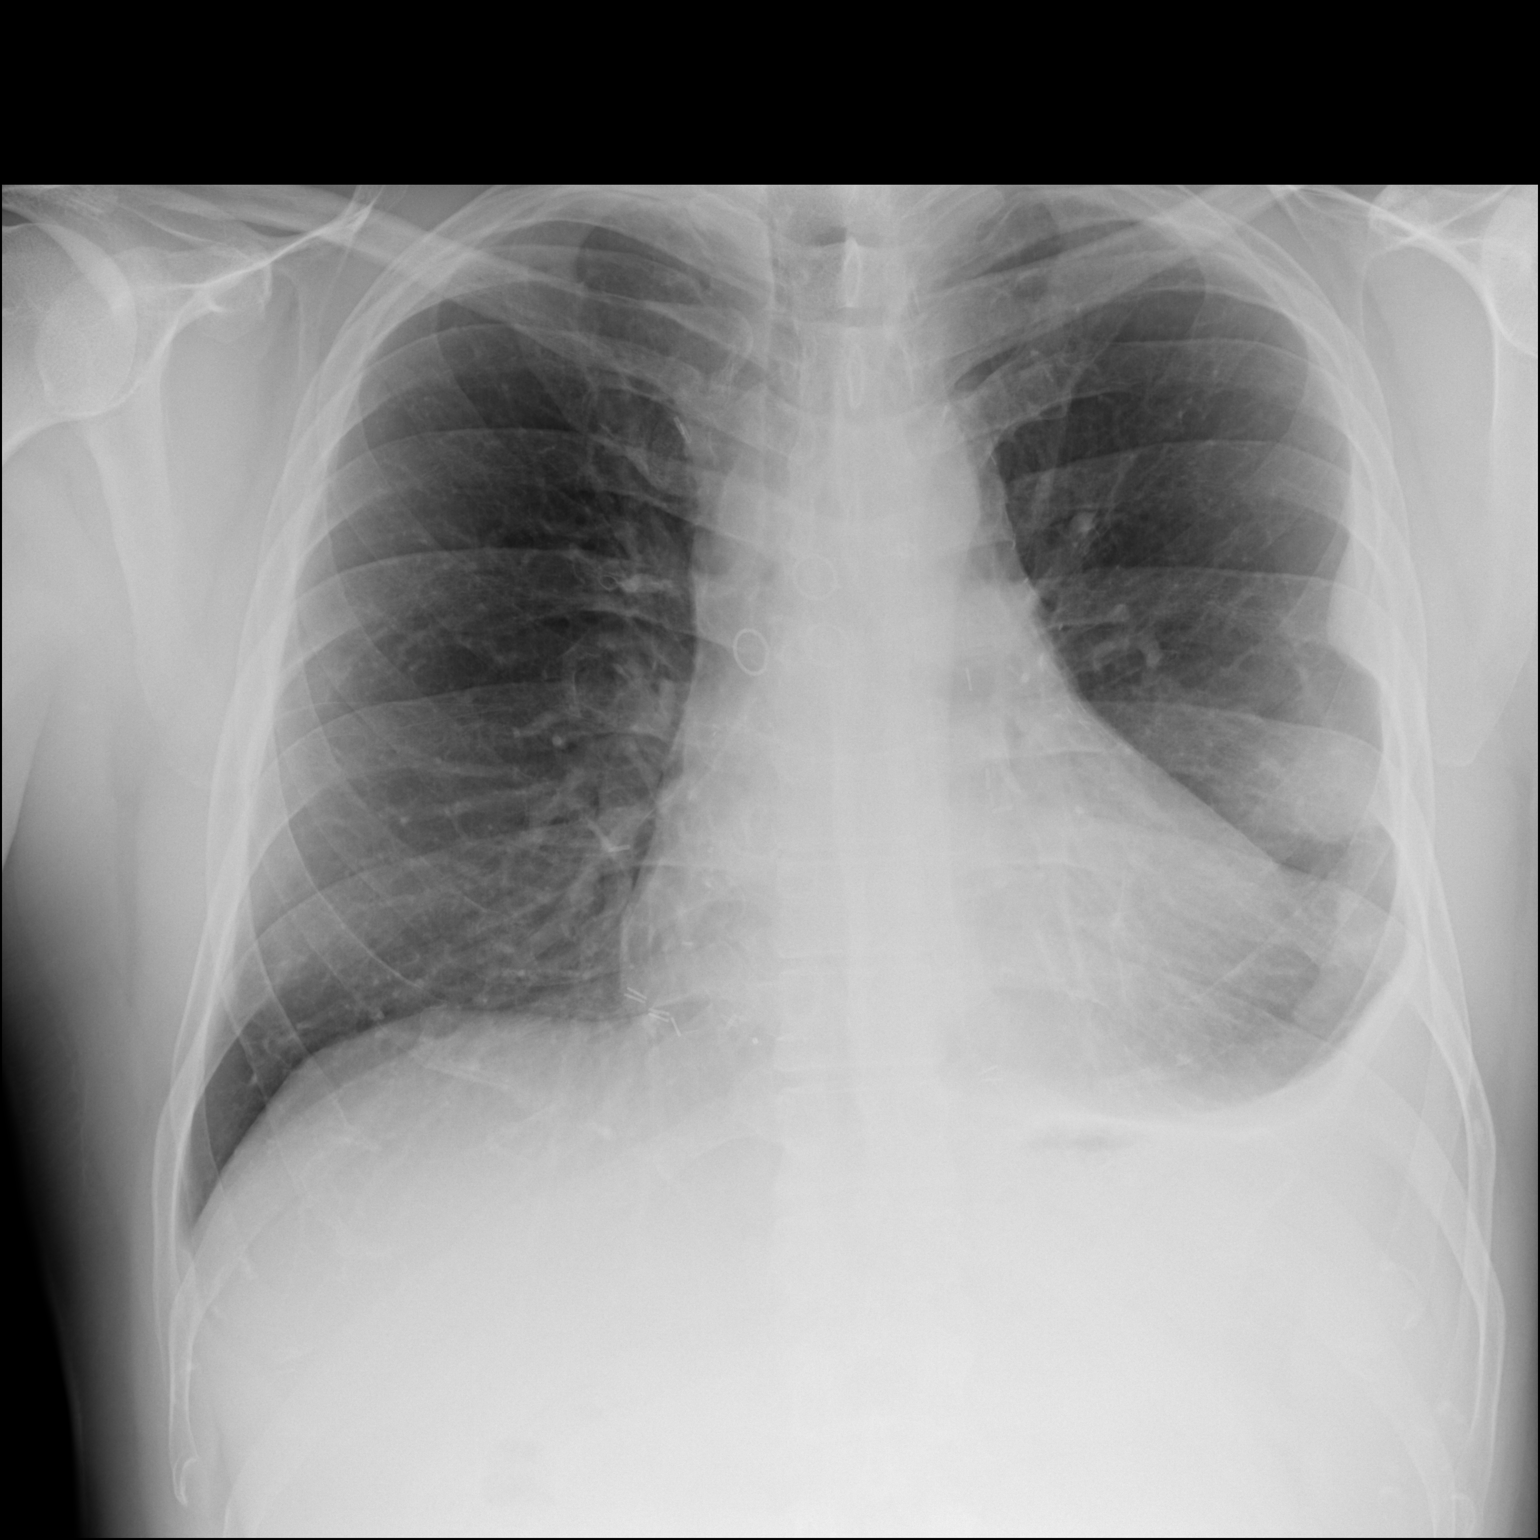

[dg chest 2 view (2 of 2)]
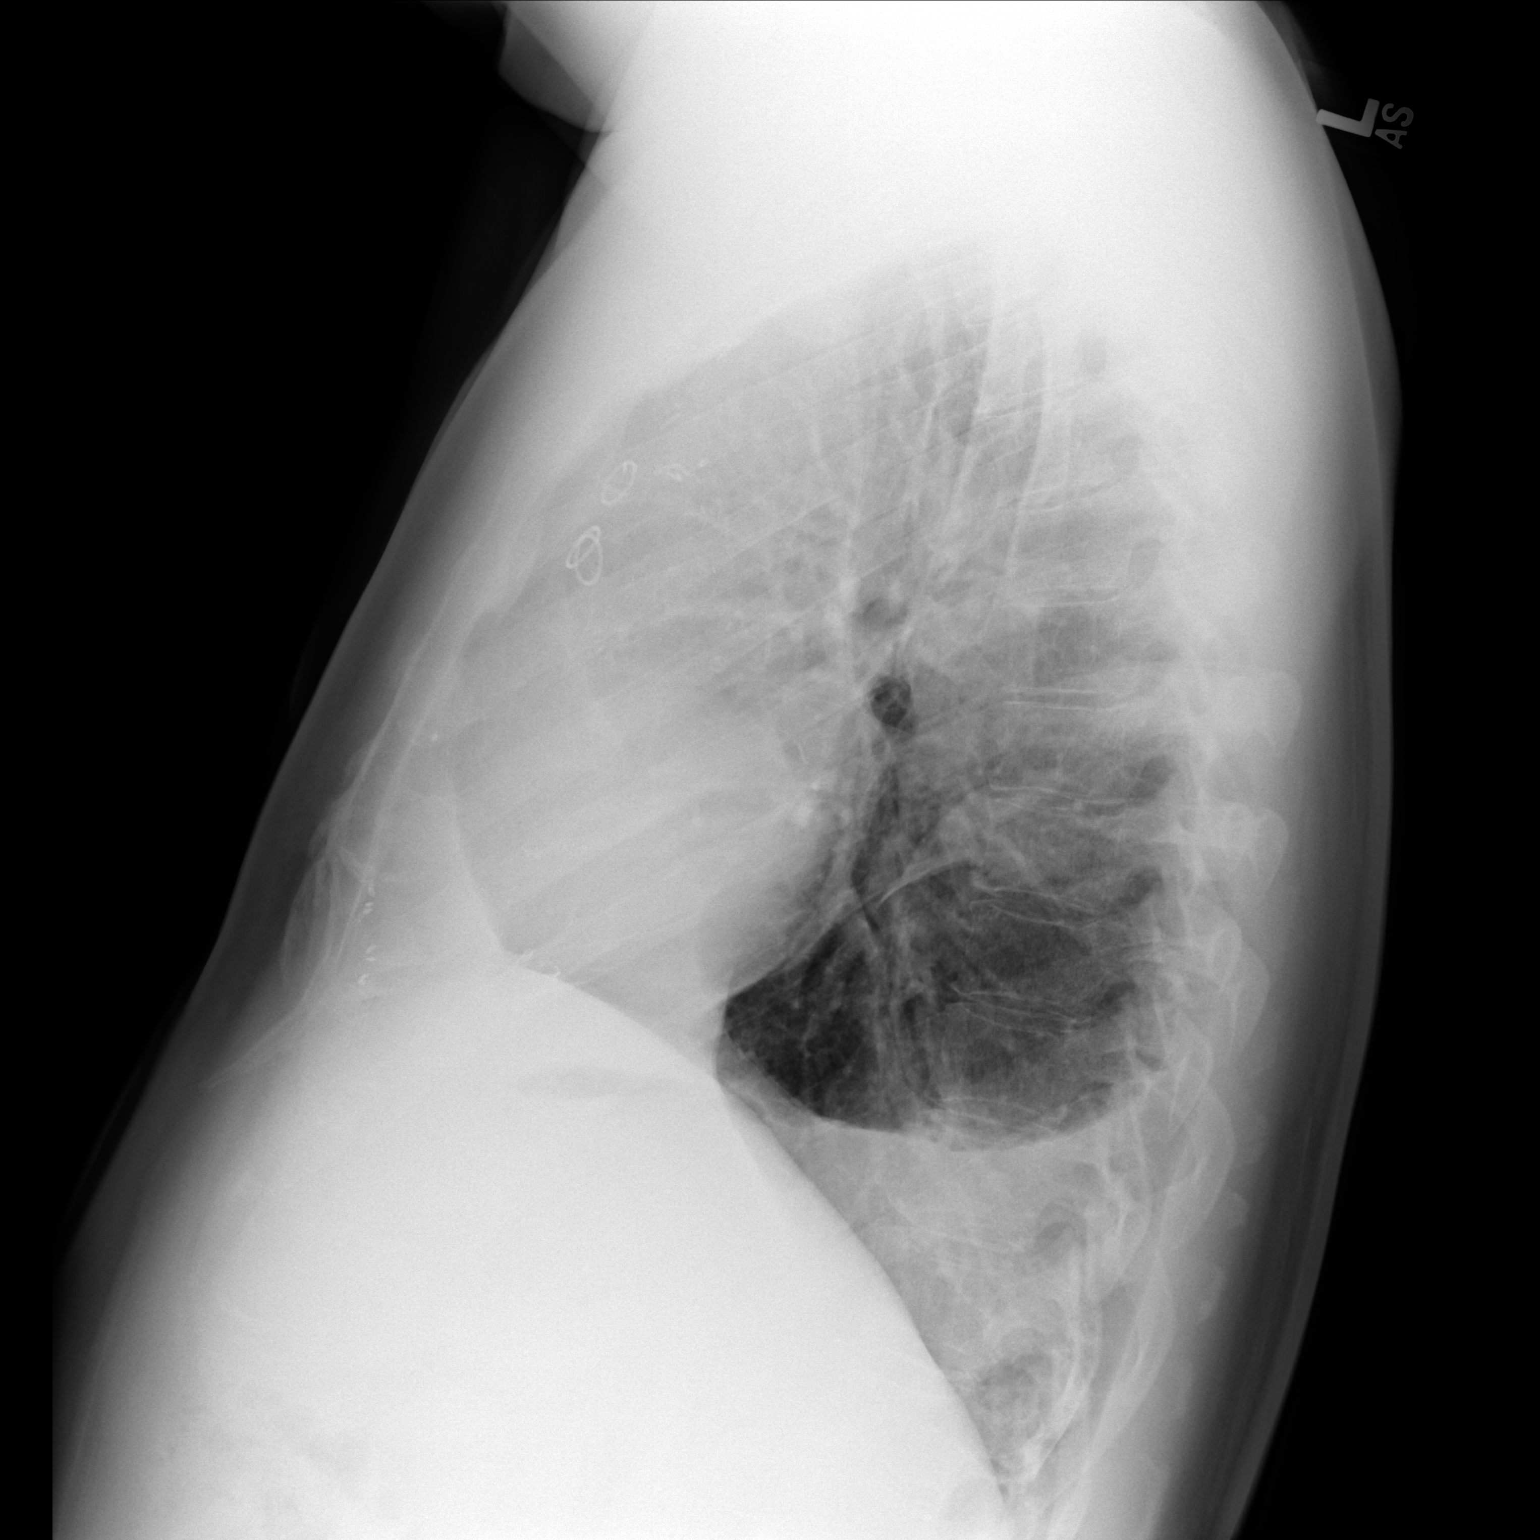

[2 of 2 positions shown; findings below may reference images not displayed]

FINDINGS: Stable cardiomediastinal silhouette. No pneumothorax is noted. Right
lung is clear. Stable loculated left pleural effusion is noted. Bony
thorax is unremarkable.
IMPRESSION: Stable loculated left pleural effusion.

## 2022-05-17 IMAGING — US IR THORACENTESIS ASP PLEURAL SPACE W/IMG GUIDE
1 series · 5 of 5 positions shown · non-contrast
Comparison: none

INDICATION: Patient with a history of a CABG November 14, 2020 presents today with a
lingering left pleural effusion. Interventional radiology asked to
perform a therapeutic thoracentesis.

[Series 1: ir (id) (id)/(id)/(id) ir · 5 of 5 slices shown]
[im 1/5]
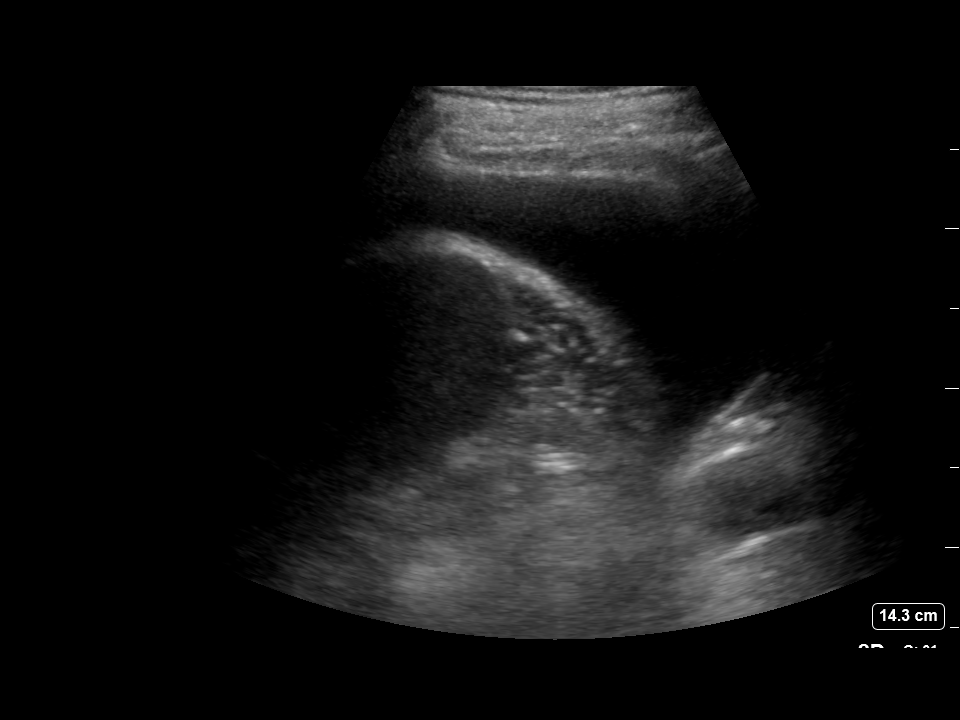
[im 2/5]
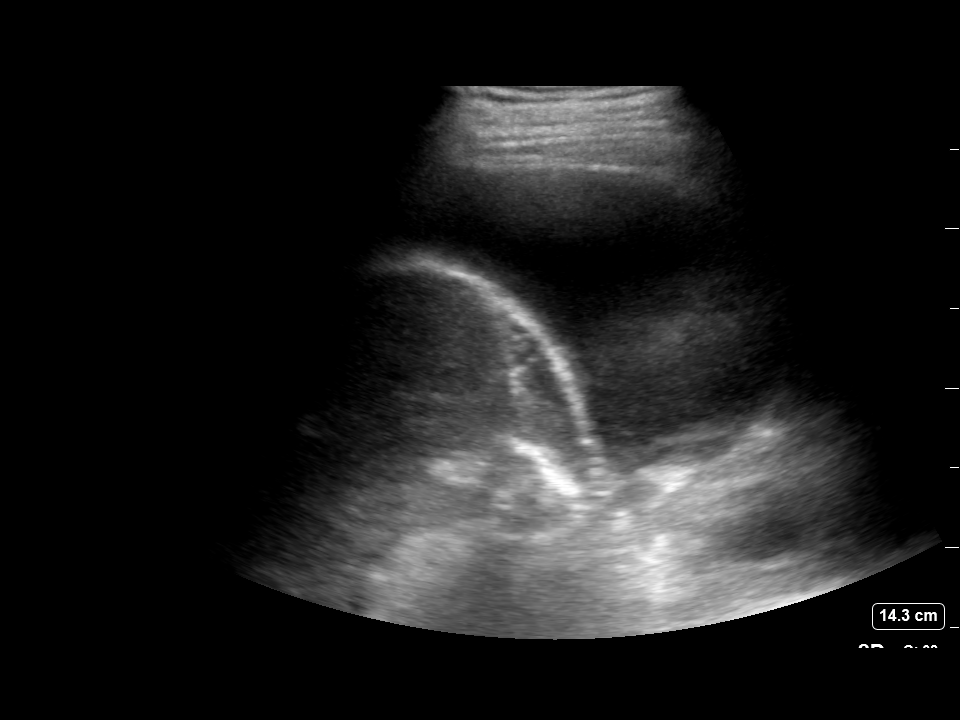
[im 3/5]
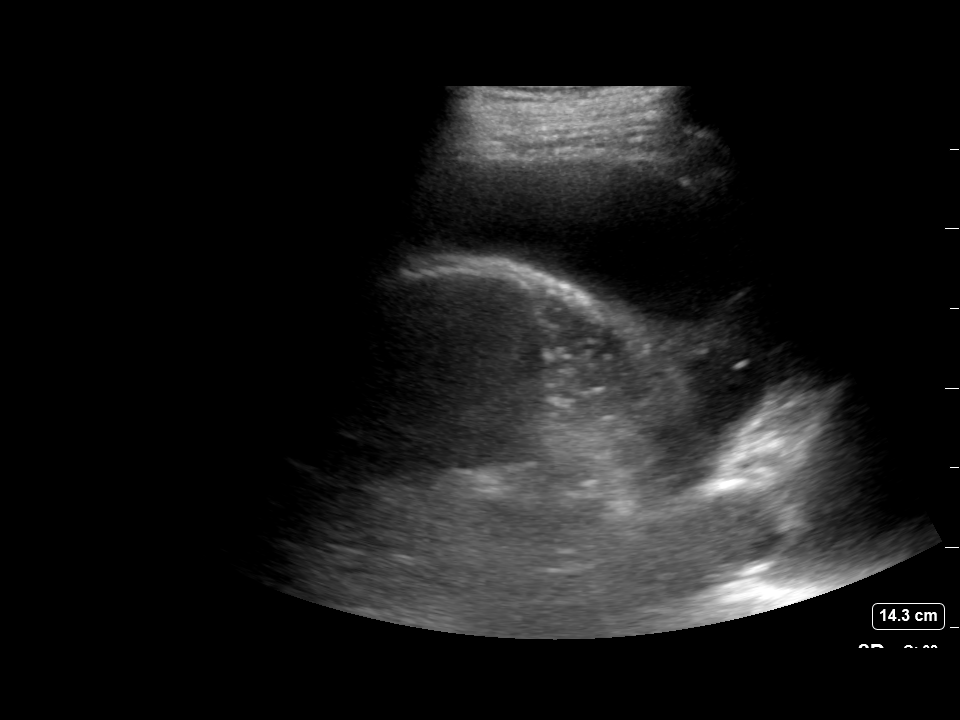
[im 4/5]
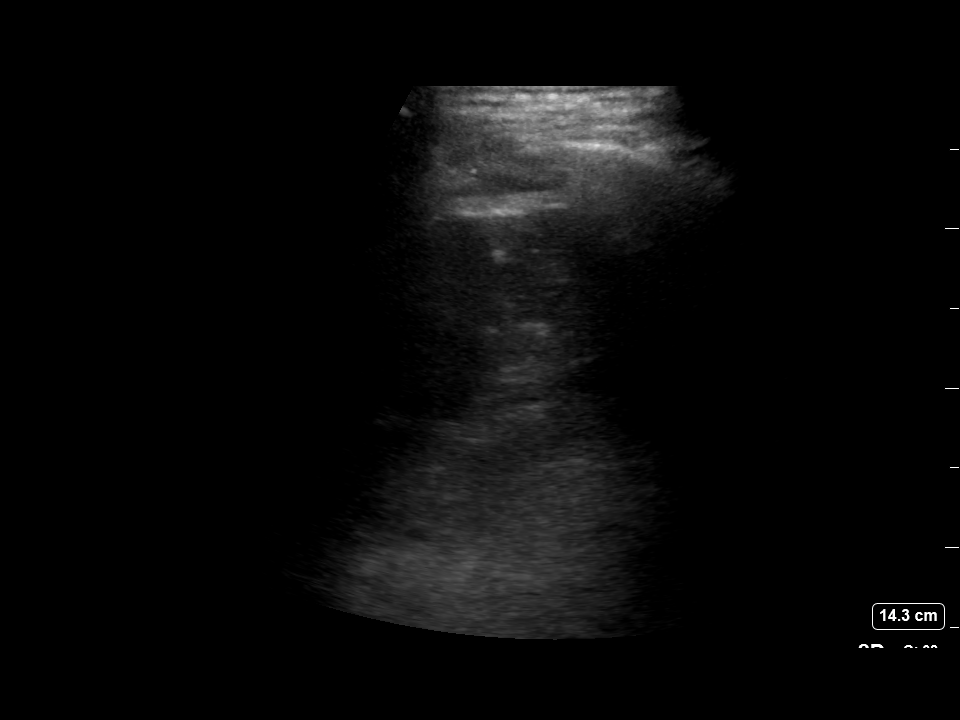
[im 5/5]
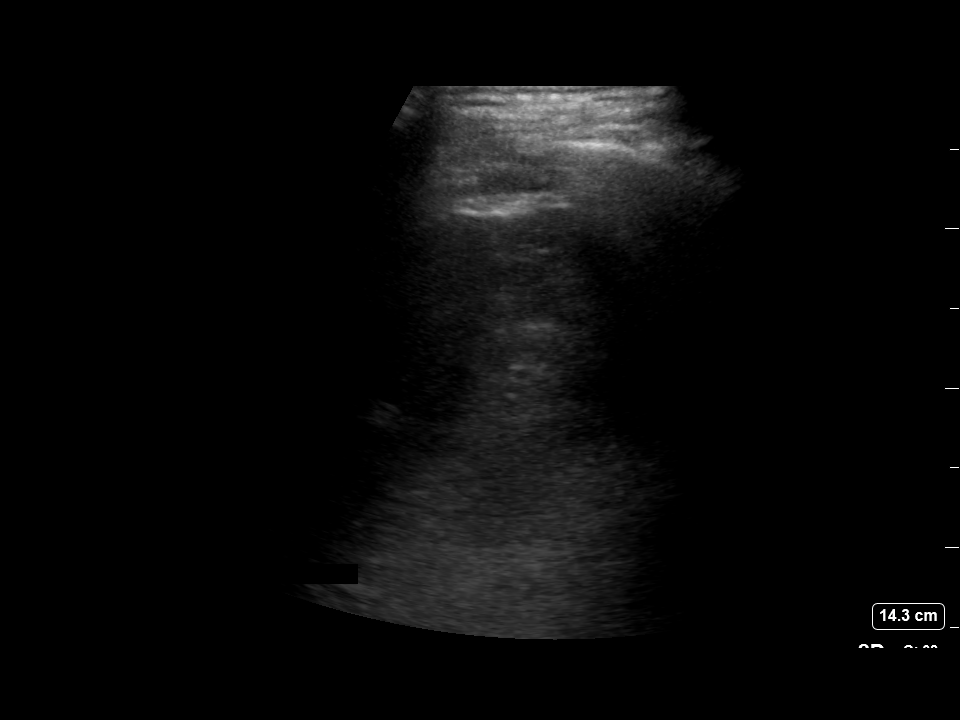

[5 of 5 positions shown; findings below may reference images not displayed]

EXAM:
ULTRASOUND GUIDED THORACENTESIS

MEDICATIONS:
1% lidocaine 10 mL

COMPLICATIONS:
None immediate.

PROCEDURE:
An ultrasound guided thoracentesis was thoroughly discussed with the
patient and questions answered. The benefits, risks, alternatives
and complications were also discussed. The patient understands and
wishes to proceed with the procedure. Written consent was obtained.

Ultrasound was performed to localize and mark an adequate pocket of
fluid in the left chest. The area was then prepped and draped in the
normal sterile fashion. 1% Lidocaine was used for local anesthesia.
Under ultrasound guidance a 6 Fr Safe-T-Centesis catheter was
introduced. Thoracentesis was performed. The catheter was removed
and a dressing applied.
FINDINGS: A total of approximately 325 mL of light red fluid was removed.
IMPRESSION: Successful ultrasound guided left thoracentesis yielding 325 mL of
pleural fluid. Read by: Dajon Novas, NP

## 2022-05-19 ENCOUNTER — Encounter: Payer: Self-pay | Admitting: Cardiology

## 2022-05-19 ENCOUNTER — Ambulatory Visit: Payer: Self-pay | Admitting: Cardiology

## 2022-05-19 VITALS — BP 135/82 | HR 70 | Temp 97.4°F | Resp 16 | Ht 71.0 in | Wt 218.8 lb

## 2022-05-19 DIAGNOSIS — I25118 Atherosclerotic heart disease of native coronary artery with other forms of angina pectoris: Secondary | ICD-10-CM

## 2022-05-19 DIAGNOSIS — E119 Type 2 diabetes mellitus without complications: Secondary | ICD-10-CM

## 2022-05-19 DIAGNOSIS — E7800 Pure hypercholesterolemia, unspecified: Secondary | ICD-10-CM

## 2022-05-19 MED ORDER — ATORVASTATIN CALCIUM 20 MG PO TABS: 20.0000 mg | ORAL_TABLET | Freq: Every day | ORAL | 3 refills | Status: DC

## 2022-05-19 MED ORDER — CLOPIDOGREL BISULFATE 75 MG PO TABS: 75.0000 mg | ORAL_TABLET | Freq: Every day | ORAL | 3 refills | Status: DC

## 2022-05-19 MED ORDER — EZETIMIBE 10 MG PO TABS: 10.0000 mg | ORAL_TABLET | Freq: Every day | ORAL | 3 refills | Status: DC

## 2022-05-19 NOTE — Progress Notes (Signed)
Primary Physician/Referring:  Pcp, No  Patient ID: Maddon Horton, male    DOB: March 13, 1971, 51 y.o.   MRN: [id number]  Chief Complaint  Patient presents with   Coronary Artery Disease   Follow-up   HPI:    Maximiano Lott  is a 51 y.o. Caucasian male  with history of remote tobacco use, obesity, strong family history of premature coronary disease, diabetes mellitus, CAD status post CABG x4 in May 2022.  He remains asymptomatic.  Could not tolerate 80 mg of Lipitor but was able to tolerate 40 mg with mild cognitive impairment and also myalgias.  Presents here for a 10-month office visit.  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   LIMA to LAD, Free RIMA to PDA, LRA to OM2, SVG to Diag   Type II diabetes mellitus (HCC) 11/13/2020   Past Surgical History:  Procedure Laterality Date   CARDIAC CATHETERIZATION     CHOLECYSTECTOMY N/A 04/15/2021   Procedure: LAPAROSCOPIC CHOLECYSTECTOMY WITH INTRAOPERATIVE CHOLANGIOGRAM;  Surgeon: Romie Levee, MD;  Location: [facility];  Service: General;  Laterality: N/A;   CORONARY ARTERY BYPASS GRAFT N/A 11/14/2020   Procedure: CORONARY ARTERY BYPASS GRAFTING (CABG), ON PUMP, TIMES FOUR, USING BILATERAL INTERNAL MAMMARY ARTERIES, LEFT RADIAL ARTERY HARVESTED OPEN, AND RIGHT ENDOSCOPICALLY HARVESTED GREATER SAPHENOUS VEIN;  Surgeon: Purcell Nails, MD;  Location: [facility] OR;  Service: Open Heart Surgery;  Laterality: N/A;   ENDOVEIN HARVEST OF GREATER SAPHENOUS VEIN Right 11/14/2020   Procedure: ENDOVEIN HARVEST OF GREATER SAPHENOUS VEIN;  Surgeon: Purcell Nails, MD;  Location: [facility] OR;  Service: Open Heart Surgery;  Laterality: Right;   IR THORACENTESIS ASP PLEURAL SPACE W/IMG GUIDE  02/27/2021   LEFT HEART CATH AND CORONARY ANGIOGRAPHY N/A 11/12/2020   Procedure: LEFT HEART CATH AND CORONARY ANGIOGRAPHY;  Surgeon: Yates Decamp, MD;  Location:  [facility];  Service: Cardiovascular;  Laterality: N/A;   RADIAL ARTERY HARVEST Left 11/14/2020   Procedure: RADIAL ARTERY HARVEST;  Surgeon: Purcell Nails, MD;  Location: [facility] OR;  Service: Open Heart Surgery;  Laterality: Left;   TEE WITHOUT CARDIOVERSION N/A 11/14/2020   Procedure: TRANSESOPHAGEAL ECHOCARDIOGRAM (TEE);  Surgeon: Purcell Nails, MD;  Location: [facility] OR;  Service: Open Heart Surgery;  Laterality: N/A;   Family History  Problem Relation Age of Onset   CAD Mother    CAD Father     Social History   Tobacco Use   Smoking status: Former    Packs/day: 2.00    Years: 10.00    Total pack years: 20.00    Types: Cigarettes    Quit date: 12/11/2000    Years since quitting: 21.4   Smokeless tobacco: Never  Substance Use Topics   Alcohol use: No   Marital Status: Married  ROS  Review of Systems  Cardiovascular:  Negative for chest pain, dyspnea on exertion and leg swelling.   Objective  Blood pressure 135/82, pulse 70, temperature (!) 97.4 F (36.3 C), temperature source Temporal, resp. rate 16, height 5\' 11"  (1.803 m), weight 218 lb 12.8 oz (99.2 kg), SpO2 98 %. Body mass index is 30.52 kg/m.     05/19/2022    2:39 PM 02/04/2022    9:58 AM 08/15/2021    1:06 PM  Vitals with BMI  Height 5\' 11"  5\' 11"  5\' 11"   Weight 218 lbs 13 oz 220 lbs 213 lbs  BMI 30.53 30.7 29.72  Systolic 135 139 025  Diastolic 82 71 86  Pulse 70 64 76     Physical Exam Neck:     Vascular: No carotid bruit or JVD.  Cardiovascular:     Rate and Rhythm: Normal rate and regular rhythm.     Pulses: Intact distal pulses.     Heart sounds: Normal heart sounds. No murmur heard.    No gallop.  Pulmonary:     Effort: Pulmonary effort is normal.     Breath sounds: Normal breath sounds.  Abdominal:     General: Bowel sounds are normal.     Palpations: Abdomen is soft.  Musculoskeletal:     Right lower leg: No edema.     Left lower leg: No edema.   Physical exam unchanged compared to  previous office visit  Laboratory examination:   Lab Results  Component Value Date   NA 132 (L) 04/16/2021   K 4.3 04/16/2021   CO2 25 04/16/2021   GLUCOSE 143 (H) 04/16/2021   BUN 11 04/16/2021   CREATININE 0.98 04/16/2021   CALCIUM 8.4 (L) 04/16/2021   GFRNONAA >60 04/16/2021    CrCl cannot be calculated (Patient's most recent lab result is older than the maximum 21 days allowed.).     Latest Ref Rng & Units 04/16/2021    5:00 AM 04/15/2021    5:01 AM 04/14/2021    4:42 AM  CMP  Glucose 70 - 99 mg/dL 427  062  98   BUN 6 - 20 mg/dL 11  10  12    Creatinine 0.61 - 1.24 mg/dL  3.76  2.83   Sodium 135 - 145 mmol/L 132  135  134   Potassium 3.5 - 5.1 mmol/L 4.3  3.9  4.6   Chloride 98 - 111 mmol/L 98  99  100   CO2 22 - 32 mmol/L 25  27  26    Calcium 8.9 - 10.3 mg/dL 8.4  8.8  8.6   Total Protein 6.5 - 8.1 g/dL   7.5   Total Bilirubin 0.3 - 1.2 mg/dL   2.6   Alkaline Phos 38 - 126 U/L   131   AST 15 - 41 U/L   19   ALT 0 - 44 U/L   25       Latest Ref Rng & Units 04/16/2021    5:00 AM 04/15/2021    5:01 AM 04/14/2021    4:42 AM  CBC  WBC 4.0 - 10.5 K/uL 13.3  7.3  8.3   Hemoglobin 13.0 - 17.0 g/dL 06/15/2021  06/14/2021  76.1   Hematocrit 39.0 - 52.0 % 38.5  39.4  40.0   Platelets 150 - 400 K/uL 235  235  223      Lipid Panel     Component Value Date/Time   CHOL 181 11/12/2020 1501   TRIG 100 11/12/2020 1501   HDL 34 (L) 11/12/2020 1501   CHOLHDL 5.3 11/12/2020 1501   VLDL 20 11/12/2020 1501   LDLCALC 127 (H) 11/12/2020 1501     HEMOGLOBIN A1C Lab Results  Component Value Date   HGBA1C 6.2 (H) 04/11/2021   MPG 131.24 04/11/2021   TSH No results for input(s): "TSH" in the last 8760 hours.   Allergies   Allergies  Allergen Reactions   Crestor [Rosuvastatin] Other (See Comments)    Brain fog and muscle weakness   Shrimp [Shellfish  Allergy] Swelling    Chest tighten    FINAL MEDICATION AS OF TODAY:   Current Outpatient Medications:    ALPRAZolam (XANAX)  1 MG tablet, Take 0.5 mg by mouth at bedtime as needed for anxiety., Disp: , Rfl:    atorvastatin (LIPITOR) 20 MG tablet, Take 1 tablet (20 mg total) by mouth daily., Disp: 90 tablet, Rfl: 3   clopidogrel (PLAVIX) 75 MG tablet, Take 1 tablet (75 mg total) by mouth daily., Disp: 90 tablet, Rfl: 3   ezetimibe (ZETIA) 10 MG tablet, Take 1 tablet (10 mg total) by mouth daily., Disp: 90 tablet, Rfl: 3   glipiZIDE (GLUCOTROL) 10 MG tablet, TAKE 1 TABLET BY MOUTH 2 TIMES DAILY BEFORE A MEAL. (Patient taking differently: Take 10 mg by mouth daily before breakfast.), Disp: 180 tablet, Rfl: 1   metoprolol tartrate (LOPRESSOR) 50 MG tablet, Take 1 tablet (50 mg total) by mouth 2 (two) times daily., Disp: 180 tablet, Rfl: 3   Radiology:   CT chest abdomen and pelvis with contrast 04/10/2021: Cardiovascular: Borderline heart size. Anterior mediastinal fat stranding related to CABG. Scattered atheromatous calcifications. Vascular/Lymphatic: No acute vascular abnormality. No mass or adenopathy.  Cardiac Studies:   Left Heart Catheterization 11/12/2020:  LV: Mild LV systolic dysfunction, EF 45% with inferior akinesis.  No significant mitral regurgitation.  Normal EDP.  No pressure gradient across the aortic valve. LM: Large-caliber vessel, normal.  Short. LAD: Large vessel in the proximal segment.  Proximal LAD has an ulcerated lesion.  There is origin of a very large D1 at the proximal segment and a large septal perforator at which point the LAD is occluded and has faint ipsilateral collaterals from the circumflex, diagonal and the SP1, and after reconstitution of the LAD, there is a focal 80% stenosis.  The diagonal 1 is very large and has proximal high-grade 80 to 90% stenosis. CX: Large vessel, giving origin to small OM1 after which it is a CTO with bridging collaterals.  Distal circumflex also receives collaterals from the LAD.  Circumflex also gives collaterals to occluded RCA. RCA: It is occluded in the  midsegment after the origin of RV branch.  Distal RCA including PL and PDA branches are large and are collateralized from circumflex and LAD.   Recommendation: Patient has complex multivessel disease and would benefit from CABG with arterial conduits if possible including LIMA to LAD and diagonal and RIMA to RCA and probably a free radial graft to the circumflex.  Surgical consultation has been requested.  Patient needs to be admitted inpatient in view of complex coronary anatomy and high risk for cardiac events.      Pre-CABG vascular studies 11/13/2020: Carotid artery duplex and ABI revealed no abnormality with triphasic waveforms at the ankles.  Echocardiogram 11/12/2020:  1. Left ventricular ejection fraction, by estimation, is 55 to 60%. The left ventricle has normal function. The left ventricle has no regional wall motion abnormalities. Left ventricular diastolic parameters were normal.  2. Right ventricular systolic function is normal. The right ventricular size is normal.  3. The mitral valve is grossly normal. No evidence of mitral valve regurgitation.  4. The aortic valve is tricuspid. Aortic valve regurgitation is not visualized.  CORONARY ARTERY BYPASS GRAFTING x 4 11/13/2021: -Free RIMA to PDA -Left Radial Artery to OM 2 -SVG to DIAGONAL -LIMA to LAD  EKG:  02/04/2022: Sinus rhythm at a rate of 60 bpm.  Normal axis.  Poor R wave progression, cannot exclude anterolateral infarct old.  Assessment  ICD-10-CM   1. Coronary artery disease of native artery of native heart with stable angina pectoris (HCC)  I25.118 atorvastatin (LIPITOR) 20 MG tablet    ezetimibe (ZETIA) 10 MG tablet    Lipoprotein A (LPA)    Lipid Panel With LDL/HDL Ratio    clopidogrel (PLAVIX) 75 MG tablet    2. Hypercholesteremia  E78.00 atorvastatin (LIPITOR) 20 MG tablet    ezetimibe (ZETIA) 10 MG tablet    Lipoprotein A (LPA)    Lipid Panel With LDL/HDL Ratio    3. Type 2 diabetes mellitus without  complication, without long-term current use of insulin (HCC)  E11.9 Hgb A1c w/o eAG       Medications Discontinued During This Encounter  Medication Reason   rosuvastatin (CRESTOR) 20 MG tablet Side effect (s)   aspirin EC 81 MG EC tablet Patient Preference    Meds ordered this encounter  Medications   atorvastatin (LIPITOR) 20 MG tablet    Sig: Take 1 tablet (20 mg total) by mouth daily.    Dispense:  90 tablet    Refill:  3   ezetimibe (ZETIA) 10 MG tablet    Sig: Take 1 tablet (10 mg total) by mouth daily.    Dispense:  90 tablet    Refill:  3   clopidogrel (PLAVIX) 75 MG tablet    Sig: Take 1 tablet (75 mg total) by mouth daily.    Dispense:  90 tablet    Refill:  3   Orders Placed This Encounter  Procedures   Lipoprotein A (LPA)   Lipid Panel With LDL/HDL Ratio   Hgb A1c w/o eAG   Recommendations:   Everton Bertha is a 51 y.o. Caucasian male  with history of remote tobacco use, obesity, strong family history of premature coronary disease, diabetes mellitus, CAD status post CABG x4 in May 2022.  He remains asymptomatic.  Could not tolerate 80 mg of Lipitor but was able to tolerate 40 mg with mild cognitive impairment and also myalgias.  Presents here for a 17-month office visit.  1. Coronary artery disease of native artery of native heart with stable angina pectoris (HCC) Stable from coronary artery disease standpoint, patient wishes to be on Plavix instead of aspirin, I sent in prescription for Plavix and discontinued aspirin.  2. Hypercholesteremia Patient could not tolerate Crestor due to severe myalgias.  Could not tolerate high intensity high-dose Lipitor at 80 mg, will add 20 mg of Lipitor along with 10 mg of Zetia, he needs lipid profile testing.  Patient does not have a PCP, he needs routine medical maintenance including diabetes management.  Encouraged him to establish with a primary care physician.  3. Type 2 diabetes mellitus without complication, without  long-term current use of insulin (HCC) Diabetes is well controlled as per patient, presently on glipizide, does not want to change his therapy.  Office visit in 6 months or sooner if problems.   Adrian Prows, PA-C 05/19/2022, 3:09 PM Office: [phone]

## 2022-07-19 ENCOUNTER — Ambulatory Visit
Admission: EM | Admit: 2022-07-19 | Discharge: 2022-07-19 | Disposition: A | Discharge status: Home / Self Care | Payer: Self-pay | Attending: Urgent Care | Admitting: Urgent Care

## 2022-07-19 DIAGNOSIS — B349 Viral infection, unspecified: Secondary | ICD-10-CM | POA: Insufficient documentation

## 2022-07-19 DIAGNOSIS — E119 Type 2 diabetes mellitus without complications: Secondary | ICD-10-CM | POA: Insufficient documentation

## 2022-07-19 DIAGNOSIS — I519 Heart disease, unspecified: Secondary | ICD-10-CM | POA: Insufficient documentation

## 2022-07-19 DIAGNOSIS — Z8679 Personal history of other diseases of the circulatory system: Secondary | ICD-10-CM | POA: Insufficient documentation

## 2022-07-19 DIAGNOSIS — Z1152 Encounter for screening for COVID-19: Secondary | ICD-10-CM | POA: Insufficient documentation

## 2022-07-19 MED ORDER — IPRATROPIUM BROMIDE 0.03 % NA SOLN: 2.0000 | Freq: Two times a day (BID) | NASAL | 0 refills | Status: DC

## 2022-07-19 MED ORDER — PROMETHAZINE-DM 6.25-15 MG/5ML PO SYRP: 5.0000 mL | ORAL_SOLUTION | Freq: Three times a day (TID) | ORAL | 0 refills | Status: DC | PRN

## 2022-07-19 MED ORDER — BENZONATATE 100 MG PO CAPS: 100.0000 mg | ORAL_CAPSULE | Freq: Three times a day (TID) | ORAL | 0 refills | Status: DC | PRN

## 2022-07-19 MED ORDER — CETIRIZINE HCL 10 MG PO TABS: 10.0000 mg | ORAL_TABLET | Freq: Every day | ORAL | 0 refills | Status: AC

## 2022-07-19 NOTE — ED Triage Notes (Signed)
Pt states that he has some nasal congestion, scratchy throat, headache, cough, chest congestion and diarrhea. X2 days  Pt states that he has some nausea as well.

## 2022-07-19 NOTE — Discharge Instructions (Signed)
We will notify you of your test results as they arrive and may take between about 24 hours.  I encourage you to sign up for MyChart if you have not already done so as this can be the easiest way for [place] to communicate results to you online or through a phone app.  Generally, we only contact you if it is a positive test result.  In the meantime, if you develop worsening symptoms including fever, chest pain, shortness of breath despite our current treatment plan then please report to the emergency room as this may be a sign of worsening status from possible viral infection.  Otherwise, we will manage this as a viral syndrome. For sore throat or cough try using a honey-based tea. Use 3 teaspoons of honey with juice squeezed from half lemon. Place shaved pieces of ginger into 1/2-1 cup of water and warm over stove top. Then mix the ingredients and repeat every 4 hours as needed. Please take Tylenol 500mg -650mg  every 6 hours for aches and pains, fevers. Hydrate very well with at least 2 liters of water. Eat light meals such as soups to replenish electrolytes and soft fruits, veggies. Start an antihistamine like Zyrtec for postnasal drainage, sinus congestion.  You can take this together with Atrovent nasal spray 2-3 times a day as needed for the same kind of congestion.  Use the cough medications as needed.

## 2022-07-19 NOTE — ED Provider Notes (Signed)
[facility]  Note:  This document was prepared using Systems analyst and may include unintentional dictation errors.  MRN: [id number] DOB: December 14, 1970  Subjective:   Gerald Andrade is a 52 y.o. male presenting for 2-day history of acute onset sinus congestion, scratchy throat, drainage, sinus headache, coughing, chest congestion and diarrhea.  Has also had some nausea.  Has 1 sick contact with his wife who is being seen today.  Has a history of heart disease, s/p CABG x 4, history of atrial fibrillation, type 2 diabetes treated without insulin.  No smoking.  No asthma.  Did a home COVID test that was negative.  No current facility-administered medications for this encounter.  Current Outpatient Medications:    ALPRAZolam (XANAX) 1 MG tablet, Take 0.5 mg by mouth at bedtime as needed for anxiety., Disp: , Rfl:    atorvastatin (LIPITOR) 20 MG tablet, Take 1 tablet (20 mg total) by mouth daily., Disp: 90 tablet, Rfl: 3   clopidogrel (PLAVIX) 75 MG tablet, Take 1 tablet (75 mg total) by mouth daily., Disp: 90 tablet, Rfl: 3   ezetimibe (ZETIA) 10 MG tablet, Take 1 tablet (10 mg total) by mouth daily., Disp: 90 tablet, Rfl: 3   glipiZIDE (GLUCOTROL) 10 MG tablet, TAKE 1 TABLET BY MOUTH 2 TIMES DAILY BEFORE A MEAL. (Patient taking differently: Take 10 mg by mouth daily before breakfast.), Disp: 180 tablet, Rfl: 1   metoprolol tartrate (LOPRESSOR) 50 MG tablet, Take 1 tablet (50 mg total) by mouth 2 (two) times daily., Disp: 180 tablet, Rfl: 3   Allergies  Allergen Reactions   Crestor [Rosuvastatin] Other (See Comments)    Brain fog and muscle weakness   Shrimp [Shellfish Allergy] Swelling    Chest tighten     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   LIMA to LAD, Free RIMA to PDA, LRA to OM2, SVG to Diag   Type II diabetes mellitus  ([place]) 11/13/2020     Past Surgical History:  Procedure Laterality Date   CARDIAC CATHETERIZATION     CHOLECYSTECTOMY N/A 04/15/2021   Procedure: LAPAROSCOPIC CHOLECYSTECTOMY WITH INTRAOPERATIVE CHOLANGIOGRAM;  Surgeon: Leighton Ruff, MD;  Location: [facility];  Service: General;  Laterality: N/A;   CORONARY ARTERY BYPASS GRAFT N/A 11/14/2020   Procedure: CORONARY ARTERY BYPASS GRAFTING (CABG), ON PUMP, TIMES FOUR, USING BILATERAL INTERNAL MAMMARY ARTERIES, LEFT RADIAL ARTERY HARVESTED OPEN, AND RIGHT ENDOSCOPICALLY HARVESTED GREATER SAPHENOUS VEIN;  Surgeon: Rexene Alberts, MD;  Location: [facility];  Service: Open Heart Surgery;  Laterality: N/A;   ENDOVEIN HARVEST OF GREATER SAPHENOUS VEIN Right 11/14/2020   Procedure: ENDOVEIN HARVEST OF GREATER SAPHENOUS VEIN;  Surgeon: Rexene Alberts, MD;  Location: [facility];  Service: Open Heart Surgery;  Laterality: Right;   IR THORACENTESIS ASP PLEURAL SPACE W/IMG GUIDE  02/27/2021   LEFT HEART CATH AND CORONARY ANGIOGRAPHY N/A 11/12/2020   Procedure: LEFT HEART CATH AND CORONARY ANGIOGRAPHY;  Surgeon: Adrian Prows, MD;  Location: [place];  Service: Cardiovascular;  Laterality: N/A;   RADIAL ARTERY HARVEST Left 11/14/2020   Procedure: RADIAL ARTERY HARVEST;  Surgeon: Rexene Alberts, MD;  Location: [place];  Service: Open Heart Surgery;  Laterality: Left;   TEE WITHOUT CARDIOVERSION N/A 11/14/2020   Procedure: TRANSESOPHAGEAL ECHOCARDIOGRAM (TEE);  Surgeon: Rexene Alberts, MD;  Location: [facility];  Service: Open Heart Surgery;  Laterality: N/A;    Family History  Problem Relation Age of Onset   CAD Mother    CAD Father     Social History   Tobacco Use   Smoking status: Former    Packs/day: 2.00    Years: 10.00    Total pack years: 20.00    Types: Cigarettes    Quit date: 12/11/2000    Years since quitting: 21.6   Smokeless tobacco: Never  Vaping Use   Vaping Use: Never used  Substance Use Topics   Alcohol use: No   Drug use: No     ROS   Objective:   Vitals: BP 130/86 (BP Location: Left Arm)   Pulse 82   Temp 99.4 F (37.4 C) (Oral)   Resp 18   Ht 6' (1.829 m)   Wt 220 lb (99.8 kg)   SpO2 95%   BMI 29.84 kg/m   Physical Exam Constitutional:      General: He is not in acute distress.    Appearance: Normal appearance. He is well-developed and normal weight. He is not ill-appearing, toxic-appearing or diaphoretic.  HENT:     Head: Normocephalic and atraumatic.     Right Ear: Tympanic membrane, ear canal and external ear normal. No drainage, swelling or tenderness. No middle ear effusion. There is no impacted cerumen. Tympanic membrane is not erythematous or bulging.     Left Ear: Tympanic membrane, ear canal and external ear normal. No drainage, swelling or tenderness.  No middle ear effusion. There is no impacted cerumen. Tympanic membrane is not erythematous or bulging.     Nose: Congestion present. No rhinorrhea.     Mouth/Throat:     Mouth: Mucous membranes are moist.     Pharynx: No oropharyngeal exudate or posterior oropharyngeal erythema.  Eyes:     General: No scleral icterus.       Right eye: No discharge.        Left eye: No discharge.     Extraocular Movements: Extraocular movements intact.     Conjunctiva/sclera: Conjunctivae normal.  Cardiovascular:     Rate and Rhythm: Normal rate and regular rhythm.     Heart sounds: Normal heart sounds. No murmur heard.    No friction rub. No gallop.  Pulmonary:     Effort: Pulmonary effort is normal. No respiratory distress.     Breath sounds: Normal breath sounds. No stridor. No wheezing, rhonchi or rales.  Musculoskeletal:     Cervical back: Normal range of motion and neck supple. No rigidity. No muscular tenderness.  Neurological:     General: No focal deficit present.     Mental Status: He is alert and oriented to person, place, and time.  Psychiatric:        Mood and Affect: Mood normal.        Behavior: Behavior normal.        Thought  Content: Thought content normal.     Assessment and Plan :   PDMP not reviewed this encounter.  1. Acute viral syndrome   2. Heart disease   3. Type 2 diabetes mellitus treated without insulin (HCC)   4. History of atrial fibrillation     Deferred imaging given clear cardiopulmonary exam, hemodynamically stable vital signs. Will manage for viral illness such as viral URI, viral syndrome, viral rhinitis, COVID-19. Recommended supportive care. Offered scripts for symptomatic relief. Testing is pending. Counseled patient on potential for adverse effects  with medications prescribed/recommended today, ER and return-to-clinic precautions discussed, patient verbalized understanding.   Patient should definitely undergo Paxlovid treatment for positive COVID test, hold atorvastatin.   Wallis Bamberg, PA-C 07/19/22 1428

## 2022-07-20 LAB — SARS CORONAVIRUS 2 (TAT 6-24 HRS): SARS Coronavirus 2: NEGATIVE

## 2022-08-31 ENCOUNTER — Telehealth: Payer: Self-pay

## 2022-08-31 NOTE — Telephone Encounter (Signed)
Pts wife is a pt of Dr. Alain Marion and she has called asking will Dr. Alain Marion take on her husband as a new pt.  Pt wife is Sue Keener.

## 2022-09-01 NOTE — Telephone Encounter (Signed)
Okay.  Thanks.

## 2022-09-01 NOTE — Telephone Encounter (Signed)
Spoke w/ wife gave her MD response. She states she will call back this afternoon to set-up appt.Marland KitchenJohny Andrade

## 2022-09-22 ENCOUNTER — Ambulatory Visit (INDEPENDENT_AMBULATORY_CARE_PROVIDER_SITE_OTHER): Payer: Self-pay | Admitting: Internal Medicine

## 2022-09-22 ENCOUNTER — Encounter: Payer: Self-pay | Admitting: Internal Medicine

## 2022-09-22 VITALS — BP 130/82 | HR 60 | Temp 98.6°F | Ht 72.0 in | Wt 227.0 lb

## 2022-09-22 DIAGNOSIS — Z789 Other specified health status: Secondary | ICD-10-CM

## 2022-09-22 DIAGNOSIS — Z951 Presence of aortocoronary bypass graft: Secondary | ICD-10-CM

## 2022-09-22 DIAGNOSIS — E1159 Type 2 diabetes mellitus with other circulatory complications: Secondary | ICD-10-CM

## 2022-09-22 DIAGNOSIS — Z9049 Acquired absence of other specified parts of digestive tract: Secondary | ICD-10-CM

## 2022-09-22 MED ORDER — VITAMIN D3 50 MCG (2000 UT) PO CAPS: 2000.0000 [IU] | ORAL_CAPSULE | Freq: Every day | ORAL | 3 refills | Status: DC

## 2022-09-22 MED ORDER — PITAVASTATIN MAGNESIUM 2 MG PO TABS: 2.0000 mg | ORAL_TABLET | Freq: Every day | ORAL | 11 refills | Status: DC

## 2022-09-22 MED ORDER — B COMPLEX PLUS PO TABS: 1.0000 | ORAL_TABLET | Freq: Every day | ORAL | 3 refills | Status: DC

## 2022-09-22 NOTE — Assessment & Plan Note (Signed)
S/p Lap chole 04/2021

## 2022-09-22 NOTE — Assessment & Plan Note (Addendum)
S/p LIMA to LAD, Free RIMA to PDA, LRA to OM2, SVG to Diag On Zetia and Lipitor

## 2022-09-22 NOTE — Assessment & Plan Note (Signed)
Pt stopped Crestor, Zetia

## 2022-09-22 NOTE — Progress Notes (Signed)
Subjective:  Patient ID: Gerald Andrade, male    DOB: [birth year]  Age: 52 y.o. MRN: [id number]  CC: New Patient (Initial Visit)   HPI Tashaun Lister presents for CAD, DM, dyslipidemia - new pt  Outpatient Medications Prior to Visit  Medication Sig Dispense Refill   cetirizine (ZYRTEC ALLERGY) 10 MG tablet Take 1 tablet (10 mg total) by mouth daily. 30 tablet 0   clopidogrel (PLAVIX) 75 MG tablet Take 1 tablet (75 mg total) by mouth daily. 90 tablet 3   ipratropium (ATROVENT) 0.03 % nasal spray Place 2 sprays into both nostrils 2 (two) times daily. 30 mL 0   ALPRAZolam (XANAX) 1 MG tablet Take 0.5 mg by mouth at bedtime as needed for anxiety.     benzonatate (TESSALON) 100 MG capsule Take 1 capsule (100 mg total) by mouth 3 (three) times daily as needed for cough. 30 capsule 0   metoprolol tartrate (LOPRESSOR) 50 MG tablet Take 1 tablet (50 mg total) by mouth 2 (two) times daily. 180 tablet 3   atorvastatin (LIPITOR) 20 MG tablet Take 1 tablet (20 mg total) by mouth daily. (Patient not taking: Reported on 09/22/2022) 90 tablet 3   ezetimibe (ZETIA) 10 MG tablet Take 1 tablet (10 mg total) by mouth daily. (Patient not taking: Reported on 09/22/2022) 90 tablet 3   glipiZIDE (GLUCOTROL) 10 MG tablet TAKE 1 TABLET BY MOUTH 2 TIMES DAILY BEFORE A MEAL. (Patient not taking: Reported on 09/22/2022) 180 tablet 1   promethazine-dextromethorphan (PROMETHAZINE-DM) 6.25-15 MG/5ML syrup Take 5 mLs by mouth 3 (three) times daily as needed for cough. 200 mL 0   No facility-administered medications prior to visit.    ROS: Review of Systems  Constitutional:  Negative for appetite change, fatigue and unexpected weight change.  HENT:  Negative for congestion, nosebleeds, sneezing, sore throat and trouble swallowing.   Eyes:  Negative for itching and visual disturbance.  Respiratory:  Negative for cough.   Cardiovascular:  Negative for chest pain, palpitations and leg swelling.   Gastrointestinal:  Negative for abdominal distention, blood in stool, diarrhea and nausea.  Genitourinary:  Negative for frequency and hematuria.  Musculoskeletal:  Negative for back pain, gait problem, joint swelling and neck pain.  Skin:  Negative for rash.  Neurological:  Negative for dizziness, tremors, speech difficulty and weakness.  Psychiatric/Behavioral:  Negative for agitation, dysphoric mood and sleep disturbance. The patient is not nervous/anxious.     Objective:  BP 130/82 (BP Location: Right Arm, Patient Position: Sitting, Cuff Size: Normal)   Pulse 60   Temp 98.6 F (37 C) (Oral)   Ht 6' (1.829 m)   Wt 227 lb (103 kg)   SpO2 98%   BMI 30.79 kg/m   BP Readings from Last 3 Encounters:  09/22/22 130/82  07/19/22 130/86  05/19/22 135/82    Wt Readings from Last 3 Encounters:  09/22/22 227 lb (103 kg)  07/19/22 220 lb (99.8 kg)  05/19/22 218 lb 12.8 oz (99.2 kg)    Physical Exam Constitutional:      General: He is not in acute distress.    Appearance: He is well-developed. He is obese.     Comments: NAD  Eyes:     Conjunctiva/sclera: Conjunctivae normal.     Pupils: Pupils are equal, round, and reactive to light.  Neck:     Thyroid: No thyromegaly.     Vascular: No JVD.  Cardiovascular:     Rate and Rhythm: Normal rate and regular  rhythm.     Heart sounds: Normal heart sounds. No murmur heard.    No friction rub. No gallop.  Pulmonary:     Effort: Pulmonary effort is normal. No respiratory distress.     Breath sounds: Normal breath sounds. No wheezing or rales.  Chest:     Chest wall: No tenderness.  Abdominal:     General: Bowel sounds are normal. There is no distension.     Palpations: Abdomen is soft. There is no mass.     Tenderness: There is no abdominal tenderness. There is no guarding or rebound.  Musculoskeletal:        General: No tenderness. Normal range of motion.     Cervical back: Normal range of motion.  Lymphadenopathy:      Cervical: No cervical adenopathy.  Skin:    General: Skin is warm and dry.     Findings: No rash.  Neurological:     Mental Status: He is alert and oriented to person, place, and time.     Cranial Nerves: No cranial nerve deficit.     Motor: No abnormal muscle tone.     Coordination: Coordination normal.     Gait: Gait normal.     Deep Tendon Reflexes: Reflexes are normal and symmetric.  Psychiatric:        Behavior: Behavior normal.        Thought Content: Thought content normal.        Judgment: Judgment normal.     Lab Results  Component Value Date   WBC 13.3 (H) 04/16/2021   HGB 12.6 (L) 04/16/2021   HCT 38.5 (L) 04/16/2021   PLT 235 04/16/2021   GLUCOSE 143 (H) 04/16/2021   CHOL 181 11/12/2020   TRIG 100 11/12/2020   HDL 34 (L) 11/12/2020   LDLCALC 127 (H) 11/12/2020   ALT 25 04/14/2021   AST 19 04/14/2021   NA 132 (L) 04/16/2021   K 4.3 04/16/2021   CL 98 04/16/2021   CREATININE 0.98 04/16/2021   BUN 11 04/16/2021   CO2 25 04/16/2021   TSH 1.479 11/19/2020   INR 1.1 04/11/2021   HGBA1C 6.2 (H) 04/11/2021    No results found.  Assessment & Plan:   Problem List Items Addressed This Visit     Type II diabetes mellitus (HCC)    On Glimeperide      Relevant Medications   Pitavastatin Magnesium 2 MG TABS   S/P CABG x 4 - Primary    S/p LIMA to LAD, Free RIMA to PDA, LRA to OM2, SVG to Diag On Zetia and Lipitor      S/P laparoscopic cholecystectomy    S/p Lap chole 04/2021      Statin intolerance    Pt stopped Crestor, Zetia         Meds ordered this encounter  Medications   Cholecalciferol (VITAMIN D3) 50 MCG (2000 UT) CAPS    Sig: Take 1 capsule (2,000 Units total) by mouth daily.    Dispense:  100 capsule    Refill:  3   B Complex-Folic Acid (B COMPLEX PLUS) TABS    Sig: Take 1 tablet by mouth daily.    Dispense:  100 tablet    Refill:  3   Pitavastatin Magnesium 2 MG TABS    Sig: Take 2 mg by mouth daily.    Dispense:  30 tablet     Refill:  11      Follow-up: Return in about 3 months (around  12/23/2022) for a follow-up visit.  Sonda Primes, MD

## 2022-09-22 NOTE — Assessment & Plan Note (Signed)
On Glimeperide

## 2022-09-24 ENCOUNTER — Telehealth: Payer: Self-pay

## 2022-09-24 NOTE — Telephone Encounter (Signed)
Patient states that he is on alprazolam '1mg'$ . - this was prescribed for anxiety to help him sleep - He would like a refill sent into [facility] - on [address].  Please call patient and let him know if this can be filled:  [phone]

## 2022-09-25 MED ORDER — ALPRAZOLAM 1 MG PO TABS: 0.5000 mg | ORAL_TABLET | Freq: Every evening | ORAL | 2 refills | Status: DC | PRN

## 2022-09-25 NOTE — Telephone Encounter (Signed)
OK. Thx

## 2022-10-13 ENCOUNTER — Other Ambulatory Visit: Payer: Self-pay | Admitting: Cardiology

## 2022-10-13 DIAGNOSIS — E119 Type 2 diabetes mellitus without complications: Secondary | ICD-10-CM

## 2022-10-21 ENCOUNTER — Other Ambulatory Visit: Payer: Self-pay

## 2022-10-21 MED ORDER — METOPROLOL TARTRATE 50 MG PO TABS: 50.0000 mg | ORAL_TABLET | Freq: Two times a day (BID) | ORAL | 3 refills | Status: DC

## 2022-11-17 ENCOUNTER — Ambulatory Visit: Payer: Self-pay | Admitting: Cardiology

## 2022-12-23 ENCOUNTER — Ambulatory Visit: Payer: Self-pay | Admitting: Internal Medicine

## 2023-02-03 ENCOUNTER — Ambulatory Visit: Payer: Self-pay | Admitting: Internal Medicine

## 2023-02-15 ENCOUNTER — Ambulatory Visit (INDEPENDENT_AMBULATORY_CARE_PROVIDER_SITE_OTHER): Payer: Self-pay | Admitting: Internal Medicine

## 2023-02-15 ENCOUNTER — Encounter: Payer: Self-pay | Admitting: Internal Medicine

## 2023-02-15 VITALS — BP 118/70 | HR 73 | Temp 98.0°F | Ht 72.0 in | Wt 223.0 lb

## 2023-02-15 DIAGNOSIS — Z789 Other specified health status: Secondary | ICD-10-CM

## 2023-02-15 DIAGNOSIS — E1159 Type 2 diabetes mellitus with other circulatory complications: Secondary | ICD-10-CM

## 2023-02-15 DIAGNOSIS — Z7984 Long term (current) use of oral hypoglycemic drugs: Secondary | ICD-10-CM

## 2023-02-15 DIAGNOSIS — Z951 Presence of aortocoronary bypass graft: Secondary | ICD-10-CM

## 2023-02-15 DIAGNOSIS — I25110 Atherosclerotic heart disease of native coronary artery with unstable angina pectoris: Secondary | ICD-10-CM

## 2023-02-15 MED ORDER — REPAGLINIDE 2 MG PO TABS: 2.0000 mg | ORAL_TABLET | Freq: Three times a day (TID) | ORAL | 11 refills | Status: DC

## 2023-02-15 MED ORDER — ALPRAZOLAM 1 MG PO TABS: 0.5000 mg | ORAL_TABLET | Freq: Every evening | ORAL | 2 refills | Status: DC | PRN

## 2023-02-15 NOTE — Assessment & Plan Note (Signed)
On Pitavastatin - tolerating ok

## 2023-02-15 NOTE — Assessment & Plan Note (Addendum)
On Glipizide - Dr Jacinto Halim Dexcom G7 sample given

## 2023-02-15 NOTE — Progress Notes (Signed)
Subjective:  Patient ID: Gerald Andrade, male    DOB: 1971/04/22  Age: 52 y.o. MRN: [id number]  CC: Follow-up (3 [facility] F/U, pt would like to discuss Xanax rx)   HPI Gerald Andrade presents for CAD, dyslipidemia, anxiety, stress and DM Tolerating Pitavastatin OK  Outpatient Medications Prior to Visit  Medication Sig Dispense Refill   B Complex-Folic Acid (B COMPLEX PLUS) TABS Take 1 tablet by mouth daily. 100 tablet 3   cetirizine (ZYRTEC ALLERGY) 10 MG tablet Take 1 tablet (10 mg total) by mouth daily. 30 tablet 0   Cholecalciferol (VITAMIN D3) 50 MCG (2000 UT) CAPS Take 1 capsule (2,000 Units total) by mouth daily. 100 capsule 3   clopidogrel (PLAVIX) 75 MG tablet Take 1 tablet (75 mg total) by mouth daily. 90 tablet 3   ipratropium (ATROVENT) 0.03 % nasal spray Place 2 sprays into both nostrils 2 (two) times daily. 30 mL 0   metoprolol tartrate (LOPRESSOR) 50 MG tablet Take 1 tablet (50 mg total) by mouth 2 (two) times daily. 180 tablet 3   Pitavastatin Magnesium 2 MG TABS Take 2 mg by mouth daily. 30 tablet 11   ALPRAZolam (XANAX) 1 MG tablet Take 0.5-1 tablets (0.5-1 mg total) by mouth at bedtime as needed for anxiety. 30 tablet 2   glipiZIDE (GLUCOTROL) 10 MG tablet TAKE 1 TABLET BY MOUTH 2 TIMES DAILY BEFORE A MEAL 180 tablet 1   No facility-administered medications prior to visit.    ROS: Review of Systems  Constitutional:  Negative for appetite change, fatigue and unexpected weight change.  HENT:  Negative for congestion, nosebleeds, sneezing, sore throat and trouble swallowing.   Eyes:  Negative for itching and visual disturbance.  Respiratory:  Negative for cough.   Cardiovascular:  Negative for chest pain, palpitations and leg swelling.  Gastrointestinal:  Negative for abdominal distention, blood in stool, diarrhea and nausea.  Genitourinary:  Negative for frequency and hematuria.  Musculoskeletal:  Negative for back pain, gait problem, joint swelling and  neck pain.  Skin:  Negative for rash.  Neurological:  Negative for dizziness, tremors, speech difficulty and weakness.  Psychiatric/Behavioral:  Positive for sleep disturbance. Negative for agitation, dysphoric mood and suicidal ideas. The patient is nervous/anxious.     Objective:  BP 118/70 (BP Location: Left Arm, Patient Position: Sitting, Cuff Size: Normal)   Pulse 73   Temp 98 F (36.7 C) (Oral)   Ht 6' (1.829 m)   Wt 223 lb (101.2 kg)   SpO2 96%   BMI 30.24 kg/m   BP Readings from Last 3 Encounters:  02/15/23 118/70  09/22/22 130/82  07/19/22 130/86    Wt Readings from Last 3 Encounters:  02/15/23 223 lb (101.2 kg)  09/22/22 227 lb (103 kg)  07/19/22 220 lb (99.8 kg)    Physical Exam Constitutional:      General: He is not in acute distress.    Appearance: He is well-developed. He is obese.     Comments: NAD  Eyes:     Conjunctiva/sclera: Conjunctivae normal.     Pupils: Pupils are equal, round, and reactive to light.  Neck:     Thyroid: No thyromegaly.     Vascular: No JVD.  Cardiovascular:     Rate and Rhythm: Normal rate and regular rhythm.     Heart sounds: Normal heart sounds. No murmur heard.    No friction rub. No gallop.  Pulmonary:     Effort: Pulmonary effort is normal. No respiratory  distress.     Breath sounds: Normal breath sounds. No wheezing or rales.  Chest:     Chest wall: No tenderness.  Abdominal:     General: Bowel sounds are normal. There is no distension.     Palpations: Abdomen is soft. There is no mass.     Tenderness: There is no abdominal tenderness. There is no guarding or rebound.  Musculoskeletal:        General: No tenderness. Normal range of motion.     Cervical back: Normal range of motion.  Lymphadenopathy:     Cervical: No cervical adenopathy.  Skin:    General: Skin is warm and dry.     Findings: No rash.  Neurological:     Mental Status: He is alert and oriented to person, place, and time.     Cranial Nerves: No  cranial nerve deficit.     Motor: No abnormal muscle tone.     Coordination: Coordination normal.     Gait: Gait normal.     Deep Tendon Reflexes: Reflexes are normal and symmetric.  Psychiatric:        Behavior: Behavior normal.        Thought Content: Thought content normal.        Judgment: Judgment normal.     Lab Results  Component Value Date   WBC 13.3 (H) 04/16/2021   HGB 12.6 (L) 04/16/2021   HCT 38.5 (L) 04/16/2021   PLT 235 04/16/2021   GLUCOSE 143 (H) 04/16/2021   CHOL 181 11/12/2020   TRIG 100 11/12/2020   HDL 34 (L) 11/12/2020   LDLCALC 127 (H) 11/12/2020   ALT 25 04/14/2021   AST 19 04/14/2021   NA 132 (L) 04/16/2021   K 4.3 04/16/2021   CL 98 04/16/2021   CREATININE 0.98 04/16/2021   BUN 11 04/16/2021   CO2 25 04/16/2021   TSH 1.479 11/19/2020   INR 1.1 04/11/2021   HGBA1C 6.2 (H) 04/11/2021    No results found.  Assessment & Plan:   Problem List Items Addressed This Visit     Coronary artery disease     On Plavix      Type II diabetes mellitus (HCC) - Primary    On Glipizide - Dr Jacinto Halim Dexcom G7 sample given      Relevant Medications   repaglinide (PRANDIN) 2 MG tablet   S/P CABG x 4    On Pitavastatin - tolerating ok      Statin intolerance     On Pitavastatin         Meds ordered this encounter  Medications   ALPRAZolam (XANAX) 1 MG tablet    Sig: Take 0.5-1 tablets (0.5-1 mg total) by mouth at bedtime as needed for anxiety.    Dispense:  30 tablet    Refill:  2   repaglinide (PRANDIN) 2 MG tablet    Sig: Take 1 tablet (2 mg total) by mouth 3 (three) times daily before meals.    Dispense:  90 tablet    Refill:  11      Follow-up: Return in about 3 months (around 05/18/2023) for a follow-up visit.  Sonda Primes, MD

## 2023-02-15 NOTE — Assessment & Plan Note (Signed)
  On Pitavastatin

## 2023-02-15 NOTE — Assessment & Plan Note (Signed)
On Plavix 

## 2023-02-28 ENCOUNTER — Encounter: Payer: Self-pay | Admitting: Internal Medicine

## 2023-03-24 ENCOUNTER — Other Ambulatory Visit: Payer: Self-pay | Admitting: Cardiology

## 2023-03-24 DIAGNOSIS — I25118 Atherosclerotic heart disease of native coronary artery with other forms of angina pectoris: Secondary | ICD-10-CM

## 2023-03-24 DIAGNOSIS — E119 Type 2 diabetes mellitus without complications: Secondary | ICD-10-CM

## 2023-04-02 ENCOUNTER — Emergency Department (HOSPITAL_COMMUNITY)
Admission: EM | Admit: 2023-04-02 | Discharge: 2023-04-03 | Disposition: A | Discharge status: Home / Self Care | Payer: No Typology Code available for payment source | Attending: Emergency Medicine | Admitting: Emergency Medicine

## 2023-04-02 ENCOUNTER — Other Ambulatory Visit: Payer: Self-pay

## 2023-04-02 ENCOUNTER — Emergency Department (HOSPITAL_COMMUNITY): Payer: No Typology Code available for payment source

## 2023-04-02 DIAGNOSIS — M545 Low back pain, unspecified: Secondary | ICD-10-CM | POA: Diagnosis present

## 2023-04-02 DIAGNOSIS — S61512A Laceration without foreign body of left wrist, initial encounter: Secondary | ICD-10-CM | POA: Insufficient documentation

## 2023-04-02 DIAGNOSIS — Z7901 Long term (current) use of anticoagulants: Secondary | ICD-10-CM | POA: Insufficient documentation

## 2023-04-02 DIAGNOSIS — Y92410 Unspecified street and highway as the place of occurrence of the external cause: Secondary | ICD-10-CM | POA: Diagnosis not present

## 2023-04-02 DIAGNOSIS — S32009A Unspecified fracture of unspecified lumbar vertebra, initial encounter for closed fracture: Secondary | ICD-10-CM | POA: Diagnosis not present

## 2023-04-02 LAB — CBC WITH DIFFERENTIAL/PLATELET
Abs Immature Granulocytes: 0.07 10*3/uL (ref 0.00–0.07)
Basophils Absolute: 0 10*3/uL (ref 0.0–0.1)
Basophils Relative: 0 %
Eosinophils Absolute: 0 10*3/uL (ref 0.0–0.5)
Eosinophils Relative: 0 %
HCT: 46.4 % (ref 39.0–52.0)
Hemoglobin: 15.1 g/dL (ref 13.0–17.0)
Immature Granulocytes: 1 %
Lymphocytes Relative: 17 %
Lymphs Abs: 1.8 10*3/uL (ref 0.7–4.0)
MCH: 28 pg (ref 26.0–34.0)
MCHC: 32.5 g/dL (ref 30.0–36.0)
MCV: 86.1 fL (ref 80.0–100.0)
Monocytes Absolute: 0.9 10*3/uL (ref 0.1–1.0)
Monocytes Relative: 9 %
Neutro Abs: 7.7 10*3/uL (ref 1.7–7.7)
Neutrophils Relative %: 73 %
Platelets: 216 10*3/uL (ref 150–400)
RBC: 5.39 MIL/uL (ref 4.22–5.81)
RDW: 13.2 % (ref 11.5–15.5)
WBC: 10.6 10*3/uL — ABNORMAL HIGH (ref 4.0–10.5)
nRBC: 0 % (ref 0.0–0.2)

## 2023-04-02 LAB — BASIC METABOLIC PANEL
Anion gap: 10 (ref 5–15)
BUN: 12 mg/dL (ref 6–20)
CO2: 24 mmol/L (ref 22–32)
Calcium: 8.9 mg/dL (ref 8.9–10.3)
Chloride: 102 mmol/L (ref 98–111)
Creatinine, Ser: 1.11 mg/dL (ref 0.61–1.24)
GFR, Estimated: >60 mL/min (ref >60)
Glucose, Bld: 155 mg/dL — ABNORMAL HIGH (ref 70–99)
Potassium: 4.1 mmol/L (ref 3.5–5.1)
Sodium: 136 mmol/L (ref 135–145)

## 2023-04-02 MED ORDER — ONDANSETRON HCL 4 MG/2ML IJ SOLN
4.0000 mg | Freq: Four times a day (QID) | INTRAMUSCULAR | Status: AC | PRN
  Administered 2023-04-02: 4 mg via INTRAVENOUS
  Filled 2023-04-02: qty 2

## 2023-04-02 MED ORDER — HYDROMORPHONE HCL 1 MG/ML IJ SOLN
1.0000 mg | INTRAMUSCULAR | Status: AC | PRN
  Administered 2023-04-02: 1 mg via INTRAVENOUS
  Filled 2023-04-02: qty 1
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
## 2023-04-03 ENCOUNTER — Emergency Department (HOSPITAL_COMMUNITY): Payer: No Typology Code available for payment source

## 2023-04-03 MED ORDER — IOHEXOL 350 MG/ML SOLN
75.0000 mL | Freq: Once | INTRAVENOUS | Status: AC | PRN
  Administered 2023-04-03: 75 mL via INTRAVENOUS

## 2023-04-03 MED ORDER — OXYCODONE HCL 5 MG PO TABS: 5.0000 mg | ORAL_TABLET | ORAL | 0 refills | Status: DC | PRN
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
## 2023-04-03 NOTE — ED Notes (Signed)
Pt provided with AVS.  Education complete; all questions answered.  Pt leaving ED in stable condition at this time, ambulatory with all belongings.
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
## 2023-04-03 NOTE — ED Notes (Signed)
Pt taken to CT at this time.

## 2023-04-06 ENCOUNTER — Encounter: Payer: Self-pay | Admitting: Internal Medicine

## 2023-04-06 DIAGNOSIS — S32009A Unspecified fracture of unspecified lumbar vertebra, initial encounter for closed fracture: Secondary | ICD-10-CM | POA: Insufficient documentation
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
## 2023-04-07 ENCOUNTER — Ambulatory Visit (INDEPENDENT_AMBULATORY_CARE_PROVIDER_SITE_OTHER): Payer: Self-pay | Admitting: Internal Medicine

## 2023-04-07 VITALS — BP 146/90 | HR 57 | Temp 97.7°F | Ht 72.0 in | Wt 223.0 lb

## 2023-04-07 DIAGNOSIS — S32009A Unspecified fracture of unspecified lumbar vertebra, initial encounter for closed fracture: Secondary | ICD-10-CM

## 2023-04-07 DIAGNOSIS — M545 Low back pain, unspecified: Secondary | ICD-10-CM

## 2023-04-07 DIAGNOSIS — M542 Cervicalgia: Secondary | ICD-10-CM

## 2023-04-07 MED ORDER — PREDNISONE 20 MG PO TABS: 40.0000 mg | ORAL_TABLET | Freq: Every day | ORAL | 0 refills | Status: AC

## 2023-04-07 MED ORDER — OXYCODONE HCL 5 MG PO TABS: 5.0000 mg | ORAL_TABLET | ORAL | 0 refills | Status: DC | PRN

## 2023-04-07 MED ORDER — CYCLOBENZAPRINE HCL 10 MG PO TABS: 10.0000 mg | ORAL_TABLET | Freq: Three times a day (TID) | ORAL | 0 refills | Status: DC | PRN

## 2023-04-07 NOTE — Patient Instructions (Addendum)
       Medications changes include :   prednisone 40 mg daily with food x 5 days, flexeril 10 mg three times a day as needed, oxycodone 5 mg every 4 hours as needed      Return for move up f/u with PCP if needed.

## 2023-04-27 ENCOUNTER — Ambulatory Visit: Payer: Self-pay | Admitting: Internal Medicine

## 2023-04-27 ENCOUNTER — Encounter: Payer: Self-pay | Admitting: Internal Medicine

## 2023-04-27 DIAGNOSIS — M545 Low back pain, unspecified: Secondary | ICD-10-CM

## 2023-04-27 DIAGNOSIS — G8929 Other chronic pain: Secondary | ICD-10-CM | POA: Insufficient documentation

## 2023-04-27 DIAGNOSIS — Z7984 Long term (current) use of oral hypoglycemic drugs: Secondary | ICD-10-CM

## 2023-04-27 DIAGNOSIS — E1159 Type 2 diabetes mellitus with other circulatory complications: Secondary | ICD-10-CM

## 2023-04-27 DIAGNOSIS — S060X1D Concussion with loss of consciousness of 30 minutes or less, subsequent encounter: Secondary | ICD-10-CM

## 2023-04-27 DIAGNOSIS — S32029A Unspecified fracture of second lumbar vertebra, initial encounter for closed fracture: Secondary | ICD-10-CM | POA: Insufficient documentation

## 2023-04-27 DIAGNOSIS — S161XXD Strain of muscle, fascia and tendon at neck level, subsequent encounter: Secondary | ICD-10-CM | POA: Insufficient documentation

## 2023-04-27 DIAGNOSIS — S060XAA Concussion with loss of consciousness status unknown, initial encounter: Secondary | ICD-10-CM | POA: Insufficient documentation

## 2023-04-27 DIAGNOSIS — S32028D Other fracture of second lumbar vertebra, subsequent encounter for fracture with routine healing: Secondary | ICD-10-CM

## 2023-04-27 MED ORDER — OXYCODONE HCL 5 MG PO TABS: 5.0000 mg | ORAL_TABLET | Freq: Four times a day (QID) | ORAL | 0 refills | Status: DC | PRN
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
## 2023-04-27 NOTE — Assessment & Plan Note (Signed)
On Glipizide - Dr Jacinto Halim Dexcom G7
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
## 2023-05-03 ENCOUNTER — Telehealth: Payer: Self-pay | Admitting: Internal Medicine
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
## 2023-05-04 MED ORDER — REPAGLINIDE 2 MG PO TABS: 2.0000 mg | ORAL_TABLET | Freq: Three times a day (TID) | ORAL | 11 refills | Status: DC

## 2023-05-04 NOTE — Telephone Encounter (Signed)
Will renew.  Thank you

## 2023-05-18 ENCOUNTER — Encounter: Payer: Self-pay | Admitting: Internal Medicine

## 2023-05-18 ENCOUNTER — Ambulatory Visit (INDEPENDENT_AMBULATORY_CARE_PROVIDER_SITE_OTHER): Payer: Self-pay | Admitting: Internal Medicine

## 2023-05-18 DIAGNOSIS — E1159 Type 2 diabetes mellitus with other circulatory complications: Secondary | ICD-10-CM

## 2023-05-18 DIAGNOSIS — S060X1D Concussion with loss of consciousness of 30 minutes or less, subsequent encounter: Secondary | ICD-10-CM

## 2023-05-18 DIAGNOSIS — Z7984 Long term (current) use of oral hypoglycemic drugs: Secondary | ICD-10-CM

## 2023-05-18 DIAGNOSIS — Z951 Presence of aortocoronary bypass graft: Secondary | ICD-10-CM

## 2023-05-18 DIAGNOSIS — S32009A Unspecified fracture of unspecified lumbar vertebra, initial encounter for closed fracture: Secondary | ICD-10-CM

## 2023-05-18 DIAGNOSIS — M545 Low back pain, unspecified: Secondary | ICD-10-CM

## 2023-05-18 MED ORDER — OXYCODONE HCL 5 MG PO TABS: 5.0000 mg | ORAL_TABLET | Freq: Four times a day (QID) | ORAL | 0 refills | Status: DC | PRN

## 2023-05-18 MED ORDER — ALPRAZOLAM 1 MG PO TABS: 0.5000 mg | ORAL_TABLET | Freq: Every evening | ORAL | 2 refills | Status: DC | PRN

## 2023-05-18 MED ORDER — METHOCARBAMOL 500 MG PO TABS: 500.0000 mg | ORAL_TABLET | Freq: Three times a day (TID) | ORAL | 0 refills | Status: DC | PRN

## 2023-05-18 NOTE — Assessment & Plan Note (Signed)
He is planning to get Dexcom 7

## 2023-05-18 NOTE — Assessment & Plan Note (Signed)
Post-concussion HAs In PT Started PT
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
## 2023-05-18 NOTE — Assessment & Plan Note (Signed)
No CP lately 

## 2023-05-20 ENCOUNTER — Other Ambulatory Visit: Payer: Self-pay | Admitting: Cardiology

## 2023-05-20 DIAGNOSIS — I25118 Atherosclerotic heart disease of native coronary artery with other forms of angina pectoris: Secondary | ICD-10-CM

## 2023-06-16 ENCOUNTER — Other Ambulatory Visit: Payer: Self-pay | Admitting: Cardiology

## 2023-06-16 DIAGNOSIS — I25118 Atherosclerotic heart disease of native coronary artery with other forms of angina pectoris: Secondary | ICD-10-CM

## 2023-06-29 ENCOUNTER — Ambulatory Visit: Payer: Self-pay | Admitting: Internal Medicine

## 2023-07-04 ENCOUNTER — Other Ambulatory Visit: Payer: Self-pay | Admitting: Cardiology

## 2023-07-04 DIAGNOSIS — I25118 Atherosclerotic heart disease of native coronary artery with other forms of angina pectoris: Secondary | ICD-10-CM

## 2023-07-19 ENCOUNTER — Ambulatory Visit (INDEPENDENT_AMBULATORY_CARE_PROVIDER_SITE_OTHER): Payer: Self-pay | Admitting: Internal Medicine

## 2023-07-19 ENCOUNTER — Encounter: Payer: Self-pay | Admitting: Internal Medicine

## 2023-07-19 VITALS — BP 140/90 | HR 79 | Temp 98.3°F | Ht 72.0 in | Wt 222.0 lb

## 2023-07-19 DIAGNOSIS — M545 Low back pain, unspecified: Secondary | ICD-10-CM

## 2023-07-19 DIAGNOSIS — E1159 Type 2 diabetes mellitus with other circulatory complications: Secondary | ICD-10-CM

## 2023-07-19 DIAGNOSIS — I25110 Atherosclerotic heart disease of native coronary artery with unstable angina pectoris: Secondary | ICD-10-CM

## 2023-07-19 MED ORDER — OXYCODONE HCL 5 MG PO TABS: 5.0000 mg | ORAL_TABLET | Freq: Four times a day (QID) | ORAL | 0 refills | Status: DC | PRN

## 2023-07-19 NOTE — Assessment & Plan Note (Signed)
 On Plavix No angina

## 2023-07-19 NOTE — Assessment & Plan Note (Signed)
 On repaginate - pt wants to do intermittent fasting
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
## 2023-07-22 ENCOUNTER — Other Ambulatory Visit: Payer: Self-pay | Admitting: Cardiology

## 2023-07-22 DIAGNOSIS — I25118 Atherosclerotic heart disease of native coronary artery with other forms of angina pectoris: Secondary | ICD-10-CM

## 2023-08-30 ENCOUNTER — Other Ambulatory Visit: Payer: Self-pay | Admitting: Internal Medicine

## 2023-09-02 ENCOUNTER — Other Ambulatory Visit: Payer: Self-pay | Admitting: Cardiology

## 2023-09-02 DIAGNOSIS — I25118 Atherosclerotic heart disease of native coronary artery with other forms of angina pectoris: Secondary | ICD-10-CM

## 2023-09-02 MED ORDER — CLOPIDOGREL BISULFATE 75 MG PO TABS
75.0000 mg | ORAL_TABLET | Freq: Every day | ORAL | 0 refills | Status: DC
Start: 2023-09-02 — End: 2023-10-18

## 2023-09-03 ENCOUNTER — Other Ambulatory Visit: Payer: Self-pay | Admitting: Internal Medicine

## 2023-09-03 NOTE — Telephone Encounter (Signed)
Copied from CRM [id number]. Topic: Clinical - Medication Refill >> Sep 03, 2023 12:56 PM Elizebeth Brooking wrote: Most Recent Primary Care Visit:  Provider: Tresa Garter  Department: [facility]  Visit Type: OFFICE VISIT  Date: 07/19/2023  Medication: ALPRAZolam Prudy Feeler) 1 MG tablet  Has the patient contacted their pharmacy? Yes (Agent: If no, request that the patient contact the pharmacy for the refill. If patient does not wish to contact the pharmacy document the reason why and proceed with request.) (Agent: If yes, when and what did the pharmacy advise?)  Is this the correct pharmacy for this prescription? Yes If no, delete pharmacy and type the correct one.  This is the patient's preferred pharmacy:  [id number] [place], [place] - [address] [place] [address] Phone: [phone] Fax: [phone]  [facility] - [place], [place] - [address] [place] [address] Phone: [phone] Fax: [phone]   Has the prescription been filled recently? No  Is the patient out of the medication? Yes  Has the patient been seen for an appointment in the last year OR does the patient have an upcoming appointment? Yes  Can we respond through MyChart? Yes  Agent: Please be advised that Rx refills may take up to 3 business days. We ask that you follow-up with your pharmacy.

## 2023-09-07 ENCOUNTER — Ambulatory Visit (INDEPENDENT_AMBULATORY_CARE_PROVIDER_SITE_OTHER): Payer: Self-pay | Admitting: Internal Medicine

## 2023-09-07 ENCOUNTER — Encounter: Payer: Self-pay | Admitting: Internal Medicine

## 2023-09-07 VITALS — BP 118/78 | HR 83 | Temp 98.3°F | Ht 73.0 in | Wt 222.0 lb

## 2023-09-07 DIAGNOSIS — S32009A Unspecified fracture of unspecified lumbar vertebra, initial encounter for closed fracture: Secondary | ICD-10-CM

## 2023-09-07 DIAGNOSIS — E1159 Type 2 diabetes mellitus with other circulatory complications: Secondary | ICD-10-CM

## 2023-09-07 MED ORDER — OXYCODONE HCL 5 MG PO TABS: 5.0000 mg | ORAL_TABLET | Freq: Four times a day (QID) | ORAL | 0 refills | Status: DC | PRN

## 2023-09-07 NOTE — Assessment & Plan Note (Signed)
 On repaginate - pt wants to do intermittent fasting

## 2023-09-07 NOTE — Assessment & Plan Note (Signed)
 Ongoing low back pain  We can try chiropractic treatments Oxycodone as needed severe pain  Potential benefits of a short term opioids use as well as potential risks (i.e. addiction risk, apnea etc) and complications (i.e. Somnolence, constipation and others) were explained to the patient and were aknowledged.
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
## 2023-10-09 ENCOUNTER — Other Ambulatory Visit: Payer: Self-pay | Admitting: Cardiology

## 2023-10-18 ENCOUNTER — Other Ambulatory Visit: Payer: Self-pay | Admitting: Cardiology

## 2023-10-18 DIAGNOSIS — I25118 Atherosclerotic heart disease of native coronary artery with other forms of angina pectoris: Secondary | ICD-10-CM

## 2023-10-18 MED ORDER — CLOPIDOGREL BISULFATE 75 MG PO TABS: 75.0000 mg | ORAL_TABLET | Freq: Every day | ORAL | 0 refills | Status: DC

## 2023-10-18 MED ORDER — METOPROLOL TARTRATE 50 MG PO TABS: 50.0000 mg | ORAL_TABLET | Freq: Two times a day (BID) | ORAL | 0 refills | Status: DC

## 2023-10-18 NOTE — Telephone Encounter (Signed)
 I spoke with patient and scheduled him to see Dr Jacinto Halim on May 23.  [facility] [address]

## 2023-10-18 NOTE — Telephone Encounter (Signed)
 Pt is overdue for an appt with Dr. Jacinto Halim and pt stated that he has been trying to get an appt and has not been able to do so. Please address

## 2023-11-08 ENCOUNTER — Ambulatory Visit (INDEPENDENT_AMBULATORY_CARE_PROVIDER_SITE_OTHER): Payer: Self-pay | Admitting: Internal Medicine

## 2023-11-08 ENCOUNTER — Encounter: Payer: Self-pay | Admitting: Internal Medicine

## 2023-11-08 VITALS — BP 124/72 | HR 76 | Temp 98.0°F | Ht 73.0 in | Wt 219.2 lb

## 2023-11-08 DIAGNOSIS — E1159 Type 2 diabetes mellitus with other circulatory complications: Secondary | ICD-10-CM

## 2023-11-08 DIAGNOSIS — I25110 Atherosclerotic heart disease of native coronary artery with unstable angina pectoris: Secondary | ICD-10-CM

## 2023-11-08 DIAGNOSIS — M545 Low back pain, unspecified: Secondary | ICD-10-CM

## 2023-11-08 MED ORDER — OXYCODONE HCL 5 MG PO TABS: 5.0000 mg | ORAL_TABLET | Freq: Four times a day (QID) | ORAL | 0 refills | Status: DC | PRN

## 2023-11-08 NOTE — Assessment & Plan Note (Signed)
 On Plavix No angina

## 2023-11-08 NOTE — Assessment & Plan Note (Signed)
 On repaginate - pt wants to do intermittent fasting Check A1s

## 2023-11-08 NOTE — Assessment & Plan Note (Signed)
 Ongoing back pain.  The pain is moderate; severe at times We can try chiropractic treatments - discussed

## 2023-11-08 NOTE — Assessment & Plan Note (Signed)
 Ongoing neck pain,  headaches.  The pain is moderate; severe at times We can try chiropractic treatments - discussed
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
## 2023-12-03 ENCOUNTER — Encounter: Payer: Self-pay | Admitting: Cardiology

## 2023-12-03 ENCOUNTER — Ambulatory Visit: Payer: Self-pay | Attending: Cardiology | Admitting: Cardiology

## 2023-12-03 ENCOUNTER — Other Ambulatory Visit (HOSPITAL_COMMUNITY): Payer: Self-pay

## 2023-12-03 VITALS — BP 110/80 | HR 55 | Ht 72.0 in | Wt 219.6 lb

## 2023-12-03 DIAGNOSIS — E7800 Pure hypercholesterolemia, unspecified: Secondary | ICD-10-CM

## 2023-12-03 DIAGNOSIS — E119 Type 2 diabetes mellitus without complications: Secondary | ICD-10-CM

## 2023-12-03 DIAGNOSIS — I2510 Atherosclerotic heart disease of native coronary artery without angina pectoris: Secondary | ICD-10-CM

## 2023-12-03 MED ORDER — ATORVASTATIN CALCIUM 10 MG PO TABS
10.0000 mg | ORAL_TABLET | Freq: Every day | ORAL | 3 refills | Status: AC
  Filled 2023-12-03: qty 90, 90d supply, fill #0
  Filled 2024-03-05 – 2024-03-17 (×2): qty 90, 90d supply, fill #1
  Filled 2024-06-18: qty 90, 90d supply, fill #2
  Filled 2024-06-19: qty 90, 90d supply, fill #0

## 2023-12-03 NOTE — Progress Notes (Addendum)
 Cardiology Office Note:  .   Date:  12/04/2023  ID:  Gerald Andrade, DOB [birth year], MRN [id number] PCP: Genia Kettering, MD  [facility] Providers Cardiologist:  Knox Perl, MD   History of Present Illness: .   Gerald Andrade is a 53 y.o. Caucasian male with history of remote tobacco use, obesity, strong family history of premature coronary disease, diabetes mellitus, CAD status post CABG x4 in May 2022. He remains asymptomatic. Could not tolerate 80 mg of Lipitor but was able to tolerate 40 mg with mild cognitive impairment and also myalgias.   Discussed the use of AI scribe software for clinical note transcription with the patient, who gave verbal consent to proceed.  History of Present Illness Gerald Andrade is a 53 year old male with coronary artery disease who presents for follow-up.  He underwent bypass surgery three years ago and currently experiences no chest pain during activities such as walking or climbing stairs. He occasionally feels winded, particularly when it is close to the time for his metoprolol  dose. He has severe statin intolerance, with significant brain fog from Crestor  and progressive symptoms with other statins. He was previously on Pitavastatin , but the mail-order program is no longer available.  He is taking repaglinide  for diabetes management, which he takes before meals. He was previously on ribazide but was switched to repaglinide .  He works at [employer] and has been with the company for nearly two years. He does not have health insurance currently but plans to enroll in December. He does not smoke or use tobacco products.   Labs   Lab Results  Component Value Date   CHOL 202 (H) 12/03/2023   HDL 31 (L) 12/03/2023   LDLCALC 146 (H) 12/03/2023   TRIG 136 12/03/2023   CHOLHDL 6.5 (H) 12/03/2023   Lipoprotein (a) 11/13/2020 9.3   Lab Results  Component Value Date   NA 136 04/02/2023   K 4.1 04/02/2023   CO2  24 04/02/2023   GLUCOSE 155 (H) 04/02/2023   BUN 12 04/02/2023   CREATININE 1.11 04/02/2023   CALCIUM  8.9 04/02/2023   GFRNONAA >60 04/02/2023      Latest Ref Rng & Units 04/02/2023   10:46 PM 04/16/2021    5:00 AM 04/15/2021    5:01 AM  BMP  Glucose 70 - 99 mg/dL 562  130  865   BUN 6 - 20 mg/dL 12  11  10    Creatinine 0.61 - 1.24 mg/dL 7.84  6.96  2.95   Sodium 135 - 145 mmol/L 136  132  135   Potassium 3.5 - 5.1 mmol/L 4.1  4.3  3.9   Chloride 98 - 111 mmol/L 102  98  99   CO2 22 - 32 mmol/L 24  25  27    Calcium  8.9 - 10.3 mg/dL 8.9  8.4  8.8       Latest Ref Rng & Units 04/02/2023   10:46 PM 04/16/2021    5:00 AM 04/15/2021    5:01 AM  CBC  WBC 4.0 - 10.5 K/uL 10.6  13.3  7.3   Hemoglobin 13.0 - 17.0 g/dL 28.4  13.2  44.0   Hematocrit 39.0 - 52.0 % 46.4  38.5  39.4   Platelets 150 - 400 K/uL 216  235  235    Lab Results  Component Value Date   HGBA1C 6.6 (H) 12/03/2023    Lab Results  Component Value Date   TSH 1.479 11/19/2020  ROS  Review of Systems  Cardiovascular:  Positive for dyspnea on exertion (chronic). Negative for chest pain, leg swelling and orthopnea.    Physical Exam:   VS:  BP 110/80   Pulse (!) 55   Ht 6' (1.829 m)   Wt 219 lb 9.6 oz (99.6 kg)   SpO2 96%   BMI 29.78 kg/m    Wt Readings from Last 3 Encounters:  12/03/23 219 lb 9.6 oz (99.6 kg)  11/08/23 219 lb 3.2 oz (99.4 kg)  09/07/23 222 lb (100.7 kg)    Physical Exam Neck:     Vascular: No carotid bruit or JVD.  Cardiovascular:     Rate and Rhythm: Normal rate and regular rhythm.     Pulses: Intact distal pulses.     Heart sounds: Normal heart sounds. No murmur heard.    No gallop.  Pulmonary:     Effort: Pulmonary effort is normal.     Breath sounds: Normal breath sounds.  Abdominal:     General: Bowel sounds are normal.     Palpations: Abdomen is soft.  Musculoskeletal:     Right lower leg: No edema.     Left lower leg: No edema.    Studies Reviewed: Aaron Aas    Left  Heart Catheterization 11/12/2020:     CORONARY ARTERY BYPASS GRAFTING x 4 11/13/2021: -Free RIMA to PDA -Left Radial Artery to OM 2 -SVG to DIAGONAL -LIMA to LAD  EKG:    EKG Interpretation Date/Time:  Friday Dec 03 2023 08:46:02 EDT Ventricular Rate:  55 PR Interval:  126 QRS Duration:  102 QT Interval:  434 QTC Calculation: 415 R Axis:   42  Text Interpretation: EKG 12/03/2023: Sinus bradycardia at 3055 bpm, IRBBB.  Normal EKG.  No change from 04/10/2021. Confirmed by Tiquan Bouch, Jagadeesh ([id number]) on 12/03/2023 8:55:23 AM    Medications and allergies    Allergies  Allergen Reactions   Crestor  [Rosuvastatin ] Other (See Comments)    Brain fog and muscle weakness   Lipitor [Atorvastatin ]     Brain fog   Shrimp [Shellfish Allergy] Swelling    Chest tighten      Current Outpatient Medications:    ALPRAZolam  (XANAX ) 1 MG tablet, TAKE 0.5-1 TABLETS (0.5-1 MG TOTAL) BY MOUTH AT BEDTIME AS NEEDED FOR ANXIETY., Disp: 30 tablet, Rfl: 2   atorvastatin  (LIPITOR) 10 MG tablet, Take 1 tablet (10 mg total) by mouth daily., Disp: 90 tablet, Rfl: 3   cetirizine  (ZYRTEC  ALLERGY) 10 MG tablet, Take 1 tablet (10 mg total) by mouth daily. (Patient taking differently: Take 10 mg by mouth as needed.), Disp: 30 tablet, Rfl: 0   clopidogrel  (PLAVIX ) 75 MG tablet, Take 1 tablet (75 mg total) by mouth daily., Disp: 90 tablet, Rfl: 0   metoprolol  tartrate (LOPRESSOR ) 50 MG tablet, Take 1 tablet (50 mg total) by mouth 2 (two) times daily., Disp: 180 tablet, Rfl: 0   oxyCODONE  (ROXICODONE ) 5 MG immediate release tablet, Take 1 tablet (5 mg total) by mouth every 6 (six) hours as needed for severe pain (pain score 7-10)., Disp: 60 tablet, Rfl: 0   repaglinide  (PRANDIN ) 2 MG tablet, Take 1 tablet (2 mg total) by mouth 3 (three) times daily before meals., Disp: 90 tablet, Rfl: 11   Meds ordered this encounter  Medications   atorvastatin  (LIPITOR) 10 MG tablet    Sig: Take 1 tablet (10 mg total) by mouth daily.     Dispense:  90 tablet    Refill:  3  Medications Discontinued During This Encounter  Medication Reason   B Complex-Folic Acid (B COMPLEX PLUS) TABS Discontinued by provider   Cholecalciferol (VITAMIN D3) 50 MCG (2000 UT) CAPS Discontinued by provider   ipratropium (ATROVENT ) 0.03 % nasal spray Discontinued by provider   methocarbamol  (ROBAXIN ) 500 MG tablet Discontinued by provider   Pitavastatin  Magnesium  2 MG TABS Discontinued by provider     ASSESSMENT AND PLAN: .      ICD-10-CM   1. Coronary artery disease involving native coronary artery of native heart without angina pectoris  I25.10 EKG 12-Lead    atorvastatin  (LIPITOR) 10 MG tablet    Lipid Profile    CANCELED: EKG 12-Lead    2. Hypercholesteremia  E78.00 EKG 12-Lead    Lipid Profile    AMB Referral to [facility]    CANCELED: EKG 12-Lead    3. Type 2 diabetes mellitus without complication, without long-term current use of insulin  (HCC)  E11.9 HgB A1c      Assessment and Plan Assessment & Plan Coronary artery disease with history of bypass surgery Coronary artery disease with bypass surgery in 2022. No current angina, but occasional dyspnea, particularly when metoprolol  is due. High risk for future cardiac events due to severe blockage history and previous myocardial infarction at age 40. Statin therapy is necessary to manage dyslipidemia and reduce the risk of another myocardial infarction. Due to severe statin intolerance, a low dose of atorvastatin  is prescribed along with Repatha to minimize side effects and effectively manage cholesterol. - Prescribe atorvastatin  10 mg daily. - Refer to pharmacist for Repatha (PCSK9 inhibitor) to be administered as a biweekly injection.  Try to get patient assistance. - Order lipid panel to assess current cholesterol levels.  Severe statin intolerance Severe statin intolerance with symptoms of cognitive impairment reported with previous statin use, including Crestor  and  atorvastatin . Currently prescribed a low dose of atorvastatin  to minimize side effects. Repatha is recommended to further manage cholesterol levels without exacerbating intolerance symptoms. - Continue atorvastatin  10 mg daily. - Refer to pharmacist for Repatha (PCSK9 inhibitor) to minimize statin use and manage cholesterol levels.  Type 2 diabetes mellitus Type 2 diabetes managed with repaglinide . Last A1c not recently checked. Monitoring is essential to ensure adequate glycemic control. - Order A1c test to assess current diabetes control.  Will send a carbon copy to his PCP.   Patient is working in the [employer], has no insurance, extremely high risk individual and has severe statin intolerance with brain fogginess, was able to tolerate Lipitor at a small dose, I will treat him at and rechallenging him with 10 mg of Lipitor, will refer him for pH to initiate Repatha.  His Lp(a) is normal.  Will try to get patient assistance for Repatha.  I would like to see him back in 6 months for follow-up.  Will obtain baseline lipids and A1c today.  Signed,  Knox Perl, MD, [facility] 12/04/2023, 5:19 PM [facility] [address] [place], [place] [address] Phone: [phone]. Fax:  [phone]   Labs reviewed, lipids remain elevated, I started him on statins, patient has been referred for Repatha initiation, diabetes remains controlled.

## 2023-12-03 NOTE — Progress Notes (Signed)
 Heart and Vascular Care Navigation  12/03/2023  Gerald Andrade 09/17/1970 [id number]  Reason for Referral: self pay Patient is participating in a Managed Medicaid Plan:No, self pay only  Engaged with patient face to face for initial visit for Heart and Vascular Care Coordination.                                                                                                   Assessment:            LCSW met with pt briefly after appt with Dr. Berry Bristol today at [facility]. Introduced self, role, reason for visit. Pt confirmed home address and emergency contacts. Employed with [employer] and has option to enroll in benefits but in the past given other expenses it was too expensive to enroll- will consider it in December when able to enroll again. Pt spouse employed, they have two adult children living with them at this time. Has access to transportation. We discussed [facility] Financial Assistance- pt shares he applied in the past but was denied and didn't feel the process was straight forward (per later chart review this was in 2022). LCSW discussed that I can get it started for him and send to home address to work on- I am available for additional questions/concerns. LCSW also discussed assistance with medications- has some on his list that may be eligible for patient assistance through [facility] and others if started on injectable that have patient assistance programs. Pt agreeable to considering this application as well. No additional questions at this time- encouraged him to let our team know if additional needs arise as we may have additional resources available to assist.                            HRT/VAS Care Coordination     Patients Home Cardiology Office --  [facility]   Outpatient Care Team Social Worker   Social Worker Name: Nathen Balder, [place], [phone]   Living arrangements for the past 2 months Single Family Home   Lives with: Spouse; Adult Children   Patient Current  Insurance Coverage Self-Pay   Patient Has Concern With Paying Medical Bills Yes   Patient Concerns With Medical Bills self pay- does not think he is medicaid eligible   Medical Bill Referrals: will send medicaid information in case;  will send CAFA started for pt assistance   Does Patient Have Prescription Coverage? No   Patient Prescription Assistance Programs [facility] Medassist   Home Assistive Devices/Equipment Dentures (specify type); Other (Comment)  tub/shower unit, standard height toilet, ramp entrance   DME Agency [employer]   [facility]       Social History:  SDOH Screenings   Food Insecurity: No Food Insecurity (12/03/2023)  Housing: Low Risk  (12/03/2023)  Transportation Needs: No Transportation Needs (12/03/2023)  Utilities: Not At Risk (12/03/2023)  Depression (PHQ2-9): Low Risk  (05/18/2023)  Financial Resource Strain: Medium Risk (12/03/2023)  Tobacco Use: Medium Risk (12/03/2023)  Health Literacy: Adequate Health Literacy (12/03/2023)    SDOH Interventions: Financial Resources:  Financial Strain Interventions: Artist, Other (Comment) (referred for assistance with medical bills to CAFA and to [employer] for medication assistance) DSS for financial assistance and Editor, commissioning for [employer] Program  Food Insecurity:  Food Insecurity Interventions: Other (Comment) (will send [employer] snap assistance card as needed)  Housing Insecurity:  Housing Interventions: Intervention Not Indicated  Transportation:   Transportation Interventions: Intervention Not Indicated    Other Care Navigation Interventions:     Provided Pharmacy assistance resources [facility]   Follow-up plan:   LCSW will mail pt applications for [employer] and [employer] along with my card to apply for assistance. Pt encouraged to call me when  received but if I dont hear from him I will f/u to answer any other questions/assist further with CAFA process. LCSW will mail pt my card as well for any questions.

## 2023-12-03 NOTE — Patient Instructions (Signed)
 Medication Instructions:  Your physician has recommended you make the following change in your medication: Start Atorvastatin  10 mg by mouth daily   *If you need a refill on your cardiac medications before your next appointment, please call your pharmacy*  Lab Work: Have A1C and lipid profile checked today at [facility] on the first floor If you have labs (blood work) drawn today and your tests are completely normal, you will receive your results only by: MyChart Message (if you have MyChart) OR A paper copy in the mail If you have any lab test that is abnormal or we need to change your treatment, we will call you to review the results.  Testing/Procedures: none  Follow-Up: At [facility], you and your health needs are our priority.  As part of our continuing mission to provide you with exceptional heart care, our providers are all part of one team.  This team includes your primary Cardiologist (physician) and Advanced Practice Providers or APPs (Physician Assistants and Nurse Practitioners) who all work together to provide you with the care you need, when you need it.  Your next appointment:   6 month(s)  Provider:   Knox Perl, MD    We recommend signing up for the patient portal called "MyChart".  Sign up information is provided on this After Visit Summary.  MyChart is used to connect with patients for Virtual Visits (Telemedicine).  Patients are able to view lab/test results, encounter notes, upcoming appointments, etc.  Non-urgent messages can be sent to your provider as well.   To learn more about what you can do with MyChart, go to [email or web address].   Other Instructions You have been referred to the lipid clinic in our office.  Please schedule new patient appointment

## 2023-12-04 ENCOUNTER — Ambulatory Visit: Payer: Self-pay | Admitting: Cardiology

## 2023-12-04 LAB — LIPID PANEL
Chol/HDL Ratio: 6.5 ratio — ABNORMAL HIGH (ref 0.0–5.0)
Cholesterol, Total: 202 mg/dL — ABNORMAL HIGH (ref 100–199)
HDL: 31 mg/dL — ABNORMAL LOW (ref >39)
LDL Chol Calc (NIH): 146 mg/dL — ABNORMAL HIGH (ref 0–99)
Triglycerides: 136 mg/dL (ref 0–149)
VLDL Cholesterol Cal: 25 mg/dL (ref 5–40)

## 2023-12-04 LAB — HEMOGLOBIN A1C
Est. average glucose Bld gHb Est-mCnc: 143 mg/dL
Hgb A1c MFr Bld: 6.6 % — ABNORMAL HIGH (ref 4.8–5.6)

## 2023-12-04 NOTE — Progress Notes (Signed)
 Labs reviewed, lipids remain elevated, I started him on statins, patient has been referred for Repatha initiation, diabetes remains controlled.

## 2023-12-05 ENCOUNTER — Other Ambulatory Visit: Payer: Self-pay | Admitting: Internal Medicine

## 2023-12-22 ENCOUNTER — Telehealth: Payer: Self-pay | Admitting: Licensed Clinical Social Worker

## 2023-12-22 NOTE — Telephone Encounter (Signed)
 H&V Care Navigation CSW Progress Note  Clinical Social Worker contacted patient by phone to f/u on assistance applications and information about Medicaid provided to pt at last clinic visit. Was able tor reach pt today at [phone], he is at work so did not have applications handy but confirmed he has them and encouraged him to reach out to me for guidance especially for medication assistance as needed. No additional questions today. Will attempt again to engage pt with assistance as needed.   Patient is participating in a Managed Medicaid Plan:  No, self pay only  SDOH Screenings   Food Insecurity: No Food Insecurity (12/03/2023)  Housing: Low Risk  (12/03/2023)  Transportation Needs: No Transportation Needs (12/03/2023)  Utilities: Not At Risk (12/03/2023)  Depression (PHQ2-9): Low Risk  (05/18/2023)  Financial Resource Strain: Medium Risk (12/03/2023)  Tobacco Use: Medium Risk (12/03/2023)  Health Literacy: Adequate Health Literacy (12/03/2023)    Nathen Balder, MSW, LCSW Clinical Social Worker II [facility] Heart/Vascular Care Navigation  [phone]- work cell phone (preferred)

## 2024-01-05 ENCOUNTER — Telehealth: Payer: Self-pay | Admitting: Licensed Clinical Social Worker

## 2024-01-05 NOTE — Telephone Encounter (Signed)
 H&V Care Navigation CSW Progress Note  Clinical Social Worker contacted patient by phone to f/u on patient assistance forms and offer any additional assistance pt may be interested in. No answer this morning at [phone]. Left voicemail. Will re-attempt again as able.  Patient is participating in a Managed Medicaid Plan:  No, self pay only  SDOH Screenings   Food Insecurity: No Food Insecurity (12/03/2023)  Housing: Low Risk  (12/03/2023)  Transportation Needs: No Transportation Needs (12/03/2023)  Utilities: Not At Risk (12/03/2023)  Depression (PHQ2-9): Low Risk  (05/18/2023)  Financial Resource Strain: Medium Risk (12/03/2023)  Tobacco Use: Medium Risk (12/03/2023)  Health Literacy: Adequate Health Literacy (12/03/2023)    Gerald Andrade, MSW, LCSW Clinical Social Worker II [facility] Heart/Vascular Care Navigation  [phone]- work cell phone (preferred)

## 2024-01-09 ENCOUNTER — Encounter (HOSPITAL_COMMUNITY): Payer: Self-pay | Admitting: *Deleted

## 2024-01-09 ENCOUNTER — Other Ambulatory Visit: Payer: Self-pay

## 2024-01-09 ENCOUNTER — Emergency Department (HOSPITAL_COMMUNITY)
Admission: EM | Admit: 2024-01-09 | Discharge: 2024-01-09 | Disposition: A | Discharge status: Home / Self Care | Payer: Self-pay | Attending: Emergency Medicine | Admitting: Emergency Medicine

## 2024-01-09 DIAGNOSIS — T675XXA Heat exhaustion, unspecified, initial encounter: Secondary | ICD-10-CM | POA: Insufficient documentation

## 2024-01-09 DIAGNOSIS — E119 Type 2 diabetes mellitus without complications: Secondary | ICD-10-CM | POA: Insufficient documentation

## 2024-01-09 DIAGNOSIS — Z7901 Long term (current) use of anticoagulants: Secondary | ICD-10-CM | POA: Insufficient documentation

## 2024-01-09 DIAGNOSIS — I2510 Atherosclerotic heart disease of native coronary artery without angina pectoris: Secondary | ICD-10-CM | POA: Insufficient documentation

## 2024-01-09 DIAGNOSIS — Z951 Presence of aortocoronary bypass graft: Secondary | ICD-10-CM | POA: Insufficient documentation

## 2024-01-09 DIAGNOSIS — R55 Syncope and collapse: Secondary | ICD-10-CM | POA: Insufficient documentation

## 2024-01-09 LAB — CBC WITH DIFFERENTIAL/PLATELET
Abs Immature Granulocytes: 0.01 10*3/uL (ref 0.00–0.07)
Basophils Absolute: 0 10*3/uL (ref 0.0–0.1)
Basophils Relative: 0 %
Eosinophils Absolute: 0 10*3/uL (ref 0.0–0.5)
Eosinophils Relative: 0 %
HCT: 47.1 % (ref 39.0–52.0)
Hemoglobin: 15.7 g/dL (ref 13.0–17.0)
Immature Granulocytes: 0 %
Lymphocytes Relative: 21 %
Lymphs Abs: 1.8 10*3/uL (ref 0.7–4.0)
MCH: 27.9 pg (ref 26.0–34.0)
MCHC: 33.3 g/dL (ref 30.0–36.0)
MCV: 83.8 fL (ref 80.0–100.0)
Monocytes Absolute: 0.7 10*3/uL (ref 0.1–1.0)
Monocytes Relative: 9 %
Neutro Abs: 6 10*3/uL (ref 1.7–7.7)
Neutrophils Relative %: 70 %
Platelets: 231 10*3/uL (ref 150–400)
RBC: 5.62 MIL/uL (ref 4.22–5.81)
RDW: 13.2 % (ref 11.5–15.5)
WBC: 8.6 10*3/uL (ref 4.0–10.5)
nRBC: 0 % (ref 0.0–0.2)

## 2024-01-09 LAB — COMPREHENSIVE METABOLIC PANEL WITH GFR
ALT: 26 U/L (ref 0–44)
AST: 21 U/L (ref 15–41)
Albumin: 3.6 g/dL (ref 3.5–5.0)
Alkaline Phosphatase: 70 U/L (ref 38–126)
Anion gap: 9 (ref 5–15)
BUN: 12 mg/dL (ref 6–20)
CO2: 21 mmol/L — ABNORMAL LOW (ref 22–32)
Calcium: 8.8 mg/dL — ABNORMAL LOW (ref 8.9–10.3)
Chloride: 105 mmol/L (ref 98–111)
Creatinine, Ser: 1.02 mg/dL (ref 0.61–1.24)
GFR, Estimated: >60 mL/min (ref >60)
Glucose, Bld: 127 mg/dL — ABNORMAL HIGH (ref 70–99)
Potassium: 4 mmol/L (ref 3.5–5.1)
Sodium: 135 mmol/L (ref 135–145)
Total Bilirubin: 1.1 mg/dL (ref 0.0–1.2)
Total Protein: 7.2 g/dL (ref 6.5–8.1)

## 2024-01-09 LAB — TROPONIN I (HIGH SENSITIVITY)
Troponin I (High Sensitivity): 3 ng/L (ref <18)
Troponin I (High Sensitivity): 3 ng/L (ref <18)

## 2024-01-09 LAB — CK: Total CK: 61 U/L (ref 49–397)

## 2024-01-09 MED ORDER — SODIUM CHLORIDE 0.9 % IV BOLUS
1000.0000 mL | Freq: Once | INTRAVENOUS | Status: AC
  Administered 2024-01-09: 1000 mL via INTRAVENOUS

## 2024-01-09 NOTE — ED Triage Notes (Signed)
 Pt arrived by gems from home  where the pt has been in the heat for approx 15-20 minutes  he started to sweat and then he became dizzy  as he went into the house he was even dizzier  no pain anywhere  a and o x4  no longer sweating feeling better  iv by gems

## 2024-01-09 NOTE — Discharge Instructions (Addendum)
 You were seen in the emerged department after an episode of fainting at home your blood work EKG all looked okay Your blood pressure and heart rate looked okay Follow-up with your PCP within 1 week for reevaluation if keep well-hydrated Return to the emergency department for chest pain trouble breathing or repeated episodes of fainting or confusion Stay out of the hot sun

## 2024-01-09 NOTE — ED Provider Notes (Signed)
 [facility] EMERGENCY DEPARTMENT AT [facility] Provider Note   CSN: [id number] Arrival date & time: 01/09/24  1453     Patient presents with: Heat Exposure   Gerald Andrade is a 53 y.o. male.  With a history of CAD status post four-vessel CABG, type 2 diabetes who presents to the ED after an episode of near syncope.  Patient was standing outside in the heat at home earlier today talking to a friend for approximately 30 minutes.  He became quite fatigued and dizzy outside and went into his house where he had an episode of near syncope but did not lose consciousness.  No chest pain shortness of breath.  Felt better after getting the [facility] but family wanted to be cautious and called EMS given concern for his cardiac history.   HPI     Prior to Admission medications   Medication Sig Start Date End Date Taking? Authorizing Provider  ALPRAZolam  (XANAX ) 1 MG tablet TAKE 0.5-1 TABLETS (0.5-1 MG TOTAL) BY MOUTH AT BEDTIME AS NEEDED FOR ANXIETY. 12/07/23  Yes Plotnikov, Aleksei V, MD  atorvastatin  (LIPITOR) 10 MG tablet Take 1 tablet (10 mg total) by mouth daily. 12/03/23 03/02/24 Yes Ladona Heinz, MD  cetirizine  (ZYRTEC  ALLERGY) 10 MG tablet Take 1 tablet (10 mg total) by mouth daily. Patient taking differently: Take 10 mg by mouth as needed. 07/19/22  Yes Christopher Savannah, PA-C  clopidogrel  (PLAVIX ) 75 MG tablet Take 1 tablet (75 mg total) by mouth daily. 10/18/23  Yes Ladona Heinz, MD  metoprolol  tartrate (LOPRESSOR ) 50 MG tablet Take 1 tablet (50 mg total) by mouth 2 (two) times daily. 10/18/23  Yes Ladona Heinz, MD  oxyCODONE  (ROXICODONE ) 5 MG immediate release tablet Take 1 tablet (5 mg total) by mouth every 6 (six) hours as needed for severe pain (pain score 7-10). 11/08/23  Yes Plotnikov, Karlynn GAILS, MD  repaglinide  (PRANDIN ) 2 MG tablet Take 1 tablet (2 mg total) by mouth 3 (three) times daily before meals. 05/04/23  Yes Plotnikov, Karlynn GAILS, MD    Allergies: Crestor  [rosuvastatin ], Lipitor  [atorvastatin ], and Shrimp [shellfish allergy]    Review of Systems  Updated Vital Signs BP 128/87   Pulse 64   Temp 98.3 F (36.8 C)   Resp (!) 8   Ht 6' (1.829 m)   Wt 99.8 kg   SpO2 100%   BMI 29.84 kg/m   Physical Exam Vitals and nursing note reviewed.  HENT:     Head: Normocephalic and atraumatic.   Eyes:     Pupils: Pupils are equal, round, and reactive to light.    Cardiovascular:     Rate and Rhythm: Normal rate and regular rhythm.     Comments: Well-healed midline sternotomy scar Pulmonary:     Effort: Pulmonary effort is normal.     Breath sounds: Normal breath sounds.  Abdominal:     Palpations: Abdomen is soft.     Tenderness: There is no abdominal tenderness.   Skin:    General: Skin is warm and dry.   Neurological:     Mental Status: He is alert.   Psychiatric:        Mood and Affect: Mood normal.     (all labs ordered are listed, but only abnormal results are displayed) Labs Reviewed  COMPREHENSIVE METABOLIC PANEL WITH GFR - Abnormal; Notable for the following components:      Result Value   CO2 21 (*)    Glucose, Bld 127 (*)  Calcium  8.8 (*)    All other components within normal limits  CBC WITH DIFFERENTIAL/PLATELET  CK  TROPONIN I (HIGH SENSITIVITY)  TROPONIN I (HIGH SENSITIVITY)    EKG: EKG Interpretation Date/Time:  Sunday January 09 2024 15:16:36 EDT Ventricular Rate:  67 PR Interval:  123 QRS Duration:  107 QT Interval:  443 QTC Calculation: 468 R Axis:   74  Text Interpretation: Sinus rhythm RSR' in V1 or V2, probably normal variant Nonspecific T abnrm, anterolateral leads ST elev, probable normal early repol pattern Confirmed by Pamella Sharper [phone]) on 01/09/2024 3:27:01 PM  Radiology: No results found.   Procedures   Medications Ordered in the ED  sodium chloride  0.9 % bolus 1,000 mL (0 mLs Intravenous Stopped 01/09/24 1646)    Clinical Course as of 01/09/24 1917  Sun Jan 09, 2024  1916 Laboratory workup  unremarkable.  Delta troponin flat.  CK not elevated.  Patient has remained stable with normal heart rate and blood pressure.  Appropriate for discharge at this time.  Most likely heat exhaustion.  He will follow-up with his PCP [MP]    Clinical Course User Index [MP] Pamella Sharper LABOR, DO                                 Medical Decision Making 53 year old male with history as above presented to the ED after syncopal episode following prolonged heat exposure.  Was standing outside in the hot sun for approximately 30 minutes felt lightheaded and dizzy had an episode of near syncope when he went home.  Felt better after entering air-conditioned environment.  Vital signs stable with EMS.  Blood glucose within normal limits.  No chest pain or shortness of breath.  Likely heat exhaustion.  Low suspicion for heatstroke.  Will obtain laboratory workup including high sensitive troponin and CK to look for any evidence of ACS or rhabdo.  Will also obtain metabolic panel and CBC's look for evidence of electrolyte imbalance kidney injury or anemia.  Initial EKG looks okay.  Will continue to monitor on telemetry and provide IV fluids for rehydration  Amount and/or Complexity of Data Reviewed Labs: ordered.        Final diagnoses:  Heat exhaustion, initial encounter  Near syncope    ED Discharge Orders     None          Pamella Sharper LABOR, DO 01/09/24 [id number]

## 2024-01-10 ENCOUNTER — Telehealth (HOSPITAL_BASED_OUTPATIENT_CLINIC_OR_DEPARTMENT_OTHER): Payer: Self-pay | Admitting: Licensed Clinical Social Worker

## 2024-01-10 NOTE — Telephone Encounter (Signed)
 H&V Care Navigation CSW Progress Note  Clinical Social Worker contacted patient by phone to f/u on assistance applications. Was able to reach him today at [phone]. Pt shares he meant to work on applications this weekend but then went to the hospital yesterday and didn't get them done as intended. Encouraged him to work on them as able, especially given additional bill from ED visit. If any questions I remain available to assist with completion/submission. Pt states understanding.  Patient is participating in a Managed Medicaid Plan:  No, self pay only  SDOH Screenings   Food Insecurity: No Food Insecurity (12/03/2023)  Housing: Low Risk  (12/03/2023)  Transportation Needs: No Transportation Needs (12/03/2023)  Utilities: Not At Risk (12/03/2023)  Depression (PHQ2-9): Low Risk  (05/18/2023)  Financial Resource Strain: Medium Risk (12/03/2023)  Tobacco Use: Medium Risk (01/09/2024)  Health Literacy: Adequate Health Literacy (12/03/2023)    Marit Lark, MSW, LCSW Clinical Social Worker II [facility] Health Heart/Vascular Care Navigation  [phone]- work cell phone (preferred)

## 2024-01-15 ENCOUNTER — Other Ambulatory Visit: Payer: Self-pay | Admitting: Cardiology

## 2024-01-17 ENCOUNTER — Other Ambulatory Visit: Payer: Self-pay | Admitting: Cardiology

## 2024-01-17 DIAGNOSIS — I25118 Atherosclerotic heart disease of native coronary artery with other forms of angina pectoris: Secondary | ICD-10-CM

## 2024-01-21 ENCOUNTER — Telehealth: Payer: Self-pay | Admitting: Licensed Clinical Social Worker

## 2024-01-21 NOTE — Telephone Encounter (Signed)
 H&V Care Navigation CSW Progress Note  Clinical Social Worker contacted patient by phone to f/u on on assistance applications. Was able to reach him today at [phone]. Pt shares he intends to work on applications ([employer] and [employer]) this weekend. Encouraged him to bring them to upcoming pharmacy appt and I can review for submission if eligible. I sent a message to [facility], PharmD to make her aware that pt may bring applications to office. Pt states understanding.  Patient is participating in a Managed Medicaid Plan:  No, self pay only  SDOH Screenings   Food Insecurity: No Food Insecurity (12/03/2023)  Housing: Low Risk  (12/03/2023)  Transportation Needs: No Transportation Needs (12/03/2023)  Utilities: Not At Risk (12/03/2023)  Depression (PHQ2-9): Low Risk  (05/18/2023)  Financial Resource Strain: Medium Risk (12/03/2023)  Tobacco Use: Medium Risk (01/09/2024)  Health Literacy: Adequate Health Literacy (12/03/2023)     Marit Lark, MSW, LCSW Clinical Social Worker II [facility] Health Heart/Vascular Care Navigation  [phone]- work cell phone (preferred)

## 2024-01-24 ENCOUNTER — Ambulatory Visit: Payer: Self-pay | Attending: Cardiovascular Disease | Admitting: Pharmacist

## 2024-01-24 DIAGNOSIS — Z789 Other specified health status: Secondary | ICD-10-CM

## 2024-01-24 DIAGNOSIS — E785 Hyperlipidemia, unspecified: Secondary | ICD-10-CM | POA: Insufficient documentation

## 2024-01-24 DIAGNOSIS — E7800 Pure hypercholesterolemia, unspecified: Secondary | ICD-10-CM

## 2024-01-24 DIAGNOSIS — I25110 Atherosclerotic heart disease of native coronary artery with unstable angina pectoris: Secondary | ICD-10-CM

## 2024-01-24 NOTE — Patient Instructions (Addendum)
 Please repeat lab work in 3 months (October).  Lab at [address] [facility]. 1st floor  Hyperlipidemia Foods high in saturated fat tend to increase LDL (bad) cholesterol the most.  Not all fat is bad fat! Foods higher in unsaturated fat are healthy, like fish, nuts, and avocadoes. Overall, following a diet like the Mediterranean diet can help to improve your cholesterol. Hypertriglyceridemia Foods high in carbohydrates and sugar, as well as alcohol, can increase your triglycerides. If you are diabetic, poorly controlled blood sugar can also increase your triglycerides. A non-fasting state can affect the triglyceride level in your lab work. Please make sure you are fasting to improve accuracy of this lab test.  Eat more of these Eat less of these  Carbohydrates Fiber-rich whole grains: oats, whole wheat pasta or bread, quinoa, barley, oats and brown rice Aim for  of your plate to be whole grains Men: aim for > 38 grams of fiber per day Women: aim for > 25 grams of fiber per day Refined grains: white bread, rice, or pasta, macaroni and cheese Foods with added sugar Processed foods: desserts like cake, cookies, donuts, muffins, and pastries; microwave meals, chips, [place] fries  Fruits and vegetables A variety of bright colored fruits and vegetables: spinach, broccoli, tomatoes, carrots, berries,  oranges, apples, bananas, berries, and melon Aim for  of your plate to be fruits/vegetables Canned vegetables Starchy vegetables like potatoes Canned fruit in heavy syrup  Protein Lean meat: skinless chicken or [place] Fish: salmon, trout, tuna, cod, tilapia, flounder, etc Legumes: beans, lentils, chickpeas, tofu, nuts Aim for  of your plate to be protein Red, fatty, or fried meat Processed foods: deli meat, hot dogs, burgers, pizza, fast food   Dairy, fats and oils Unsaturated fats: fish, nuts, and avocadoes  Low fat or fat free milk or yogurt Olive and canola oil Saturated fats: butter, lard,  cream, coconut oil Whole milk and other full fat dairy products like cheese Sugar-sweetened dairy products (many yogurts have added sugar)  Drinks Water: plain or sparkling Sugar free or diet drinks Unsweet tea or coffee Keep added sugar intake to  < 6 teaspoons (24 grams) Regular soda Fruit juice Alcohol  Other ways to adopt a healthy lifestyle:  Exercise:  Exercise: Aim for 150 min of moderate intensity exercise weekly for heart health, and weights twice weekly for bone health. Stay active - any steps are better than no steps!  Sleep: Aim for 7-9 hours of sleep nightly.  Weight: Know what a healthy weight is for you (roughly BMI <25) and aim to maintain this. Unfortunately, this is not the most accurate measure of healthy weight, but it is the simplest measurement to use. A more accurate measurement involves body scanning which measures lean muscle, fat tissue and bony density. We do not have this equipment at [facility].
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
## 2024-01-24 NOTE — Assessment & Plan Note (Addendum)
 Assessment: LDL-C is above goal of less than 70 Patient tolerating atorvastatin  10 mg daily-no repeat labs on this yet He had some concerns about Repatha and its effects on blood sugar which we discussed Patient wanting to work on some lifestyle things first We discussed decreasing saturated fat by decreasing foods like processed meats and butter.  He still drinks 2 sprites per day but has significantly decreased this since 2022.  He is still working on completely stopping soda No longer smokes and rarely drinks alcohol  Plan: Continue atorvastatin  10 mg daily Discussed ways to improve his diet and handout given Increase physical activity with a few days of resistance training Recheck labs in [place] consideration of additional agent if LDL-C above 70

## 2024-02-02 ENCOUNTER — Other Ambulatory Visit: Payer: Self-pay | Admitting: Internal Medicine

## 2024-02-09 ENCOUNTER — Ambulatory Visit (INDEPENDENT_AMBULATORY_CARE_PROVIDER_SITE_OTHER): Payer: Self-pay | Admitting: Internal Medicine

## 2024-02-09 ENCOUNTER — Encounter: Payer: Self-pay | Admitting: Internal Medicine

## 2024-02-09 VITALS — BP 120/66 | HR 56 | Temp 98.3°F | Ht 73.0 in | Wt 218.0 lb

## 2024-02-09 DIAGNOSIS — S060X1D Concussion with loss of consciousness of 30 minutes or less, subsequent encounter: Secondary | ICD-10-CM

## 2024-02-09 DIAGNOSIS — M545 Low back pain, unspecified: Secondary | ICD-10-CM

## 2024-02-09 DIAGNOSIS — M5481 Occipital neuralgia: Secondary | ICD-10-CM | POA: Insufficient documentation

## 2024-02-09 DIAGNOSIS — S32009A Unspecified fracture of unspecified lumbar vertebra, initial encounter for closed fracture: Secondary | ICD-10-CM

## 2024-02-09 DIAGNOSIS — S32028D Other fracture of second lumbar vertebra, subsequent encounter for fracture with routine healing: Secondary | ICD-10-CM

## 2024-02-09 DIAGNOSIS — S060X1S Concussion with loss of consciousness of 30 minutes or less, sequela: Secondary | ICD-10-CM

## 2024-02-09 MED ORDER — OXYCODONE HCL 5 MG PO TABS: 5.0000 mg | ORAL_TABLET | Freq: Four times a day (QID) | ORAL | 0 refills | Status: DC | PRN

## 2024-02-09 NOTE — Assessment & Plan Note (Signed)
 Oxycodone  as needed severe pain  Potential benefits of a short term opioids use as well as potential risks (i.e. addiction risk, apnea etc) and complications (i.e. Somnolence, constipation and others) were explained to the patient and were aknowledged. Pt stopped physical therapy
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
## 2024-02-09 NOTE — Assessment & Plan Note (Signed)
 Ongoing back pain.  The pain is moderate; severe at times We can try chiropractic treatments - discussed
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
## 2024-03-06 ENCOUNTER — Other Ambulatory Visit (HOSPITAL_COMMUNITY): Payer: Self-pay

## 2024-03-15 ENCOUNTER — Other Ambulatory Visit (HOSPITAL_COMMUNITY): Payer: Self-pay

## 2024-03-17 ENCOUNTER — Other Ambulatory Visit: Payer: Self-pay

## 2024-05-11 ENCOUNTER — Ambulatory Visit (INDEPENDENT_AMBULATORY_CARE_PROVIDER_SITE_OTHER): Payer: Self-pay | Admitting: Internal Medicine

## 2024-05-11 ENCOUNTER — Encounter: Payer: Self-pay | Admitting: Internal Medicine

## 2024-05-11 DIAGNOSIS — E1159 Type 2 diabetes mellitus with other circulatory complications: Secondary | ICD-10-CM

## 2024-05-11 DIAGNOSIS — S161XXD Strain of muscle, fascia and tendon at neck level, subsequent encounter: Secondary | ICD-10-CM

## 2024-05-11 DIAGNOSIS — F419 Anxiety disorder, unspecified: Secondary | ICD-10-CM

## 2024-05-11 DIAGNOSIS — S32028D Other fracture of second lumbar vertebra, subsequent encounter for fracture with routine healing: Secondary | ICD-10-CM

## 2024-05-11 DIAGNOSIS — G5621 Lesion of ulnar nerve, right upper limb: Secondary | ICD-10-CM

## 2024-05-11 DIAGNOSIS — M545 Low back pain, unspecified: Secondary | ICD-10-CM

## 2024-05-11 MED ORDER — ALPRAZOLAM 1 MG PO TABS: 1.0000 mg | ORAL_TABLET | Freq: Every evening | ORAL | 3 refills | Status: AC | PRN

## 2024-05-11 MED ORDER — OXYCODONE HCL 5 MG PO TABS: 5.0000 mg | ORAL_TABLET | Freq: Four times a day (QID) | ORAL | 0 refills | Status: DC | PRN

## 2024-05-11 MED ORDER — VITAMIN D3 50 MCG (2000 UT) PO CAPS
2000.0000 [IU] | ORAL_CAPSULE | Freq: Every day | ORAL | 3 refills | Status: AC
Start: 2024-05-11 — End: ?

## 2024-05-11 MED ORDER — B COMPLEX-C PO TABS: 1.0000 | ORAL_TABLET | Freq: Every day | ORAL | 3 refills | Status: AC

## 2024-05-11 NOTE — Patient Instructions (Signed)
Ulnar neuropathy

## 2024-05-11 NOTE — Assessment & Plan Note (Addendum)
 Ongoing neck pain,  headaches.  The pain is moderate; severe at times We can try chiropractic treatments - discussed Cervical spine MRI if twitching/paresthesias are not better
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
## 2024-05-11 NOTE — Assessment & Plan Note (Signed)
 On repaginate - pt wants to do intermittent fasting Check A1s

## 2024-05-11 NOTE — Assessment & Plan Note (Addendum)
 Pain is better
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
## 2024-05-11 NOTE — Assessment & Plan Note (Signed)
 Ongoing back pain.  The pain is moderate; severe at times We can try chiropractic treatments - discussed

## 2024-05-11 NOTE — Assessment & Plan Note (Signed)
 On Alprazolam  prn  Potential benefits of a long term benzodiazepines  use as well as potential risks  and complications were explained to the patient and were aknowledged.

## 2024-05-11 NOTE — Assessment & Plan Note (Signed)
 Discussed - fire-like pain in the R elbow and down - burning - once a week  Take B complex

## 2024-06-07 ENCOUNTER — Encounter: Payer: Self-pay | Admitting: Pharmacist

## 2024-06-11 ENCOUNTER — Encounter: Payer: Self-pay | Admitting: Internal Medicine

## 2024-06-13 ENCOUNTER — Other Ambulatory Visit: Payer: Self-pay | Admitting: Internal Medicine

## 2024-06-19 ENCOUNTER — Other Ambulatory Visit (HOSPITAL_COMMUNITY): Payer: Self-pay

## 2024-06-19 ENCOUNTER — Other Ambulatory Visit: Payer: Self-pay

## 2024-06-26 ENCOUNTER — Encounter: Payer: Self-pay | Admitting: Family Medicine

## 2024-06-26 ENCOUNTER — Ambulatory Visit: Payer: Self-pay | Admitting: Family Medicine

## 2024-06-26 ENCOUNTER — Ambulatory Visit: Payer: Self-pay

## 2024-06-26 VITALS — BP 138/88 | HR 91 | Temp 98.4°F | Ht 73.0 in | Wt 218.0 lb

## 2024-06-26 DIAGNOSIS — R6889 Other general symptoms and signs: Secondary | ICD-10-CM

## 2024-06-26 DIAGNOSIS — J069 Acute upper respiratory infection, unspecified: Secondary | ICD-10-CM

## 2024-06-26 LAB — POC COVID19 BINAXNOW: SARS Coronavirus 2 Ag: NEGATIVE

## 2024-06-26 LAB — POCT INFLUENZA A/B
Influenza A, POC: NEGATIVE
Influenza B, POC: NEGATIVE

## 2024-06-26 LAB — POCT RAPID STREP A (OFFICE): Rapid Strep A Screen: NEGATIVE

## 2024-06-26 MED ORDER — PROMETHAZINE-DM 6.25-15 MG/5ML PO SYRP: 5.0000 mL | ORAL_SOLUTION | Freq: Four times a day (QID) | ORAL | 0 refills | Status: AC | PRN

## 2024-06-26 MED ORDER — AZITHROMYCIN 250 MG PO TABS: ORAL_TABLET | ORAL | 0 refills | Status: AC

## 2024-06-26 MED ORDER — BENZONATATE 200 MG PO CAPS: 200.0000 mg | ORAL_CAPSULE | Freq: Three times a day (TID) | ORAL | 0 refills | Status: AC | PRN

## 2024-06-26 NOTE — Progress Notes (Unsigned)
° °  Acute Office Visit   Subjective:  Patient ID: Jerusalem Wert, male    DOB: [birth year], 53 y.o.   MRN: [id number]  Chief Complaint  Patient presents with   Cough   Nasal Congestion    HPI Patient is present for an acute visit. Dr. Karlynn Plotnikov is his primary care provider.  Patient is having the following symptoms:  Cough-more of a dry cough  Sore throat Chest congestion  Trouble breathing with exertion-mostly after having a coughing episode  Headache-relates it more to coughing  Mild right ear ache   Denies CP, SHOB, fever, nausea, vomiting, body aches, loss of appetite.   Symptoms started 5-7 days ago.   He has tried rest, hydration, home remedies, a puff off an old inhaler, and OTC medication. He reports he has took an Oxycodone  that help decrease cough and pain.  ROS See HPI above      Objective:   BP 138/88   Pulse 91   Temp 98.4 F (36.9 C) (Oral)   Ht 6' 1 (1.854 m)   Wt 218 lb (98.9 kg)   SpO2 98%   BMI 28.76 kg/m  {Vitals History (Optional):23777}  Physical Exam  Results for orders placed or performed in visit on 06/26/24  POC Rapid Strep A  Result Value Ref Range   Rapid Strep A Screen Negative Negative  POC COVID-19  Result Value Ref Range   SARS Coronavirus 2 Ag Negative Negative  POC Influenza A/B  Result Value Ref Range   Influenza A, POC Negative Negative   Influenza B, POC Negative Negative        Assessment & Plan:  Flu-like symptoms -     POCT rapid strep A -     POC COVID-19 BinaxNow -     POCT Influenza A/B    No follow-ups on file.  Tequisha Maahs, NP

## 2024-06-26 NOTE — Telephone Encounter (Signed)
 FYI Only or Action Required?: FYI only for provider: appointment [facility].  Patient was last seen in primary care on 05/11/2024 by Plotnikov, Karlynn GAILS, MD.  Called Nurse Triage reporting Cough.  Symptoms began several days ago.  Interventions attempted: OTC medications: Expectorant per pharmacist and Rest, hydration, or home remedies.  Symptoms are: gradually worsening.  Triage Disposition: See Physician Within 24 Hours  Patient/caregiver understands and will follow disposition?: Yes   Copied from CRM [id number]. Topic: Clinical - Red Word Triage >> Jun 26, 2024  8:35 AM Gerald Andrade wrote: Red Word that prompted transfer to Nurse Triage: 4th sore throat and really bad cough. Chest congestion/some trouble breathing. Reason for Disposition  [1] Continuous (nonstop) coughing interferes with work or school AND [2] no improvement using cough treatment per Care Advice  Answer Assessment - Initial Assessment Questions 1. ONSET: When did the cough begin?      X 4 days  3. SPUTUM: Describe the color of your sputum (e.g., none, dry cough; clear, white, yellow, green)     None 4. HEMOPTYSIS: Are you coughing up any blood? If Yes, ask: How much? (e.g., flecks, streaks, tablespoons, etc.)     None 5. DIFFICULTY BREATHING: Are you having difficulty breathing? If Yes, ask: How bad is it? (e.g., mild, moderate, severe)      None 6. FEVER: Do you have a fever? If Yes, ask: What is your temperature, how was it measured, and when did it start?     None 7. CARDIAC HISTORY: Do you have any history of heart disease? (e.g., heart attack, congestive heart failure)      Quadruple Bypass 8. LUNG HISTORY: Do you have any history of lung disease?  (e.g., pulmonary embolus, asthma, emphysema)     None 9. PE RISK FACTORS: Do you have a history of blood clots? (or: recent major surgery, recent prolonged travel, bedridden)     None 10. OTHER  SYMPTOMS: Do you have any other symptoms? (e.g., runny nose, wheezing, chest pain)       Chest congestion, sore throat  Protocols used: Cough - Acute Non-Productive-A-AH

## 2024-06-27 NOTE — Patient Instructions (Addendum)
-  It was a pleasure to care for you. -Rapid strep, covid, and influenza are negative.  -Symptoms more likely originated as a viral infection. Concerned this could be starting a secondary infection with symptoms improving, but becoming worse.  -Prescribed Azithromycin  250mg  tablet, take 2 tablets today, then 1 tablet the 2nd through 5th day.  -Prescribed Promethazine -DM cough syrup. Take 5 mLs by mouth every 6 hours as needed. Caution: medication can cause drowsiness. -Prescribed Benzonatate  200mg  capsule to take every 8 hours as needed for cough.  -Provided an excuse work note to return back on 06/28/2024.  -Follow up if not improved with primary care provider or this provider.

## 2024-07-25 ENCOUNTER — Other Ambulatory Visit: Payer: Self-pay | Admitting: Internal Medicine

## 2024-07-25 NOTE — Telephone Encounter (Signed)
 Copied from CRM #[id number]. Topic: Clinical - Medication Refill >> Jul 25, 2024  4:53 PM Delon T wrote: Medication: oxyCODONE  (ROXICODONE ) 5 MG immediate release tablet   Has the patient contacted their pharmacy? No (Agent: If no, request that the patient contact the pharmacy for the refill. If patient does not wish to contact the pharmacy document the reason why and proceed with request.) (Agent: If yes, when and what did the pharmacy advise?)  This is the patient's preferred pharmacy:  [id number] [place], [place] - [address] [place] [address] Phone: [phone] Fax: [phone]  Is this the correct pharmacy for this prescription? Yes If no, delete pharmacy and type the correct one.   Has the prescription been filled recently? Yes  Is the patient out of the medication? Yes  Has the patient been seen for an appointment in the last year OR does the patient have an upcoming appointment? Yes  Can we respond through MyChart? Yes  Agent: Please be advised that Rx refills may take up to 3 business days. We ask that you follow-up with your pharmacy.

## 2024-07-28 NOTE — Telephone Encounter (Signed)
 Copied from CRM #[id number]. Topic: Clinical - Prescription Issue >> Jul 28, 2024 12:32 PM Laymon HERO wrote: Reason for CRM: patient calling to check on med refill Oxycodone . It has not been sent to the pharmacy and he is out of the medication.

## 2024-07-30 ENCOUNTER — Encounter: Payer: Self-pay | Admitting: Internal Medicine

## 2024-07-30 MED ORDER — OXYCODONE HCL 5 MG PO TABS: 5.0000 mg | ORAL_TABLET | Freq: Four times a day (QID) | ORAL | 0 refills | Status: AC | PRN

## 2024-08-15 ENCOUNTER — Encounter: Payer: Self-pay | Admitting: Internal Medicine

## 2024-08-15 ENCOUNTER — Ambulatory Visit: Payer: Self-pay | Admitting: Internal Medicine

## 2024-08-15 VITALS — BP 118/74 | HR 89 | Ht 73.0 in | Wt 217.8 lb

## 2024-08-15 DIAGNOSIS — S32028D Other fracture of second lumbar vertebra, subsequent encounter for fracture with routine healing: Secondary | ICD-10-CM

## 2024-08-15 DIAGNOSIS — R42 Dizziness and giddiness: Secondary | ICD-10-CM | POA: Insufficient documentation

## 2024-08-15 DIAGNOSIS — F419 Anxiety disorder, unspecified: Secondary | ICD-10-CM

## 2024-08-15 DIAGNOSIS — I25110 Atherosclerotic heart disease of native coronary artery with unstable angina pectoris: Secondary | ICD-10-CM

## 2024-08-15 DIAGNOSIS — G8929 Other chronic pain: Secondary | ICD-10-CM

## 2024-08-15 DIAGNOSIS — E1159 Type 2 diabetes mellitus with other circulatory complications: Secondary | ICD-10-CM

## 2024-08-15 LAB — CBC WITH DIFFERENTIAL/PLATELET
Basophils Absolute: 0 10*3/uL (ref 0.0–0.1)
Basophils Relative: 0.3 % (ref 0.0–3.0)
Eosinophils Absolute: 0 10*3/uL (ref 0.0–0.7)
Eosinophils Relative: 0.5 % (ref 0.0–5.0)
HCT: 47.5 % (ref 39.0–52.0)
Hemoglobin: 15.9 g/dL (ref 13.0–17.0)
Lymphocytes Relative: 24.5 % (ref 12.0–46.0)
Lymphs Abs: 2 10*3/uL (ref 0.7–4.0)
MCHC: 33.5 g/dL (ref 30.0–36.0)
MCV: 84.6 fl (ref 78.0–100.0)
Monocytes Absolute: 0.7 10*3/uL (ref 0.1–1.0)
Monocytes Relative: 8.3 % (ref 3.0–12.0)
Neutro Abs: 5.3 10*3/uL (ref 1.4–7.7)
Neutrophils Relative %: 66.4 % (ref 43.0–77.0)
Platelets: 222 10*3/uL (ref 150.0–400.0)
RBC: 5.62 Mil/uL (ref 4.22–5.81)
RDW: 14.1 % (ref 11.5–15.5)
WBC: 8 10*3/uL (ref 4.0–10.5)

## 2024-08-15 LAB — COMPREHENSIVE METABOLIC PANEL WITH GFR
ALT: 29 U/L (ref 3–53)
AST: 25 U/L (ref 5–37)
Albumin: 4.4 g/dL (ref 3.5–5.2)
Alkaline Phosphatase: 79 U/L (ref 39–117)
BUN: 14 mg/dL (ref 6–23)
CO2: 29 meq/L (ref 19–32)
Calcium: 10 mg/dL (ref 8.4–10.5)
Chloride: 102 meq/L (ref 96–112)
Creatinine, Ser: 0.93 mg/dL (ref 0.40–1.50)
GFR: 93.32 mL/min
Glucose, Bld: 125 mg/dL — ABNORMAL HIGH (ref 70–99)
Potassium: 4.1 meq/L (ref 3.5–5.1)
Sodium: 139 meq/L (ref 135–145)
Total Bilirubin: 1.1 mg/dL (ref 0.2–1.2)
Total Protein: 7.7 g/dL (ref 6.0–8.3)

## 2024-08-15 LAB — TSH: TSH: 0.89 u[IU]/mL (ref 0.35–5.50)

## 2024-08-15 LAB — HEMOGLOBIN A1C: Hgb A1c MFr Bld: 7.1 % — ABNORMAL HIGH (ref 4.6–6.5)

## 2024-08-15 NOTE — Assessment & Plan Note (Addendum)
 Ongoing back pain.  The pain is moderate; severe at times. Not better. LS MRI - ordered We can try chiropractic treatments - discussed L2 transverse process fracture on the X ray Oxycodone  as needed severe pain  Potential benefits of a short term opioids use as well as potential risks (i.e. addiction risk, apnea etc) and complications (i.e. Somnolence, constipation and others) were explained to the patient and were aknowledged.

## 2024-08-15 NOTE — Assessment & Plan Note (Signed)
 On Alprazolam  prn  Potential benefits of a long term benzodiazepines  use as well as potential risks  and complications were explained to the patient and were aknowledged.

## 2024-08-15 NOTE — Assessment & Plan Note (Signed)
 Oxycodone  as needed severe pain  Potential benefits of a short term opioids use as well as potential risks (i.e. addiction risk, apnea etc) and complications (i.e. Somnolence, constipation and others) were explained to the patient and were aknowledged. Start physical therapy

## 2024-08-15 NOTE — Assessment & Plan Note (Addendum)
 Short episode this am - /vaso-vagal; resolved Check labs today CBG, BP, HR was ok

## 2024-08-15 NOTE — Assessment & Plan Note (Addendum)
 On repaglinate - pt wants to do intermittent fasting Check A1s

## 2024-08-15 NOTE — Assessment & Plan Note (Signed)
 On Plavix  No angina

## 2024-08-27 ENCOUNTER — Other Ambulatory Visit

## 2024-11-14 ENCOUNTER — Ambulatory Visit: Admitting: Internal Medicine
